# Patient Record
Sex: Female | Born: 1960
Health system: Southern US, Community
[De-identification: ages and names within clinical notes are randomized; demographics above are authoritative.]

## PROBLEM LIST (undated history)

## (undated) DIAGNOSIS — T7840XA Allergy, unspecified, initial encounter: Secondary | ICD-10-CM

## (undated) DIAGNOSIS — K219 Gastro-esophageal reflux disease without esophagitis: Secondary | ICD-10-CM

## (undated) DIAGNOSIS — I10 Essential (primary) hypertension: Secondary | ICD-10-CM

## (undated) DIAGNOSIS — E119 Type 2 diabetes mellitus without complications: Secondary | ICD-10-CM

## (undated) DIAGNOSIS — D649 Anemia, unspecified: Secondary | ICD-10-CM

## (undated) DIAGNOSIS — E785 Hyperlipidemia, unspecified: Secondary | ICD-10-CM

## (undated) HISTORY — DX: Type 2 diabetes mellitus without complications: E11.9

## (undated) HISTORY — DX: Gastro-esophageal reflux disease without esophagitis: K21.9

## (undated) HISTORY — PX: APPENDECTOMY: SHX54

## (undated) HISTORY — DX: Essential (primary) hypertension: I10

## (undated) HISTORY — PX: ABDOMINAL HYSTERECTOMY: SHX81

## (undated) HISTORY — DX: Allergy, unspecified, initial encounter: T78.40XA

## (undated) HISTORY — DX: Hyperlipidemia, unspecified: E78.5

## (undated) HISTORY — DX: Anemia, unspecified: D64.9

---

## 2001-12-24 ENCOUNTER — Emergency Department (HOSPITAL_COMMUNITY): Admission: EM | Admit: 2001-12-24 | Discharge: 2001-12-24 | Payer: Self-pay | Admitting: Emergency Medicine

## 2002-02-01 ENCOUNTER — Emergency Department (HOSPITAL_COMMUNITY): Admission: EM | Admit: 2002-02-01 | Discharge: 2002-02-01 | Payer: Self-pay | Admitting: Emergency Medicine

## 2002-02-23 ENCOUNTER — Encounter: Admission: RE | Admit: 2002-02-23 | Discharge: 2002-05-24 | Payer: Self-pay | Admitting: Occupational Medicine

## 2002-03-02 ENCOUNTER — Encounter: Payer: Self-pay | Admitting: Occupational Medicine

## 2002-03-02 ENCOUNTER — Encounter: Admission: RE | Admit: 2002-03-02 | Discharge: 2002-03-02 | Payer: Self-pay | Admitting: Occupational Medicine

## 2002-11-06 ENCOUNTER — Encounter: Payer: Self-pay | Admitting: Orthopedic Surgery

## 2002-11-06 ENCOUNTER — Encounter: Admission: RE | Admit: 2002-11-06 | Discharge: 2002-11-06 | Payer: Self-pay | Admitting: Orthopedic Surgery

## 2002-11-19 ENCOUNTER — Ambulatory Visit (HOSPITAL_BASED_OUTPATIENT_CLINIC_OR_DEPARTMENT_OTHER): Admission: RE | Admit: 2002-11-19 | Discharge: 2002-11-19 | Payer: Self-pay | Admitting: Orthopedic Surgery

## 2002-12-03 ENCOUNTER — Encounter: Admission: RE | Admit: 2002-12-03 | Discharge: 2002-12-30 | Payer: Self-pay | Admitting: Orthopedic Surgery

## 2003-03-19 ENCOUNTER — Emergency Department (HOSPITAL_COMMUNITY): Admission: AD | Admit: 2003-03-19 | Discharge: 2003-03-19 | Payer: Self-pay | Admitting: Emergency Medicine

## 2004-08-17 ENCOUNTER — Emergency Department (HOSPITAL_COMMUNITY): Admission: EM | Admit: 2004-08-17 | Discharge: 2004-08-17 | Payer: Self-pay | Admitting: Family Medicine

## 2004-11-24 ENCOUNTER — Encounter: Admission: RE | Admit: 2004-11-24 | Discharge: 2004-11-24 | Payer: Self-pay | Admitting: Internal Medicine

## 2005-01-18 ENCOUNTER — Emergency Department (HOSPITAL_COMMUNITY): Admission: EM | Admit: 2005-01-18 | Discharge: 2005-01-18 | Payer: Self-pay | Admitting: Emergency Medicine

## 2005-05-11 ENCOUNTER — Emergency Department (HOSPITAL_COMMUNITY): Admission: EM | Admit: 2005-05-11 | Discharge: 2005-05-11 | Payer: Self-pay | Admitting: Emergency Medicine

## 2005-09-03 ENCOUNTER — Encounter: Admission: RE | Admit: 2005-09-03 | Discharge: 2005-09-03 | Payer: Self-pay | Admitting: Internal Medicine

## 2005-10-04 ENCOUNTER — Encounter: Admission: RE | Admit: 2005-10-04 | Discharge: 2005-10-04 | Payer: Self-pay | Admitting: Internal Medicine

## 2005-10-09 ENCOUNTER — Emergency Department (HOSPITAL_COMMUNITY): Admission: EM | Admit: 2005-10-09 | Discharge: 2005-10-09 | Payer: Self-pay | Admitting: Emergency Medicine

## 2007-04-20 ENCOUNTER — Encounter: Admission: RE | Admit: 2007-04-20 | Discharge: 2007-04-20 | Payer: Self-pay | Admitting: Orthopedic Surgery

## 2008-05-08 ENCOUNTER — Emergency Department (HOSPITAL_BASED_OUTPATIENT_CLINIC_OR_DEPARTMENT_OTHER): Admission: EM | Admit: 2008-05-08 | Discharge: 2008-05-09 | Payer: Self-pay | Admitting: Emergency Medicine

## 2008-05-09 ENCOUNTER — Ambulatory Visit: Payer: Self-pay | Admitting: Diagnostic Radiology

## 2008-05-11 ENCOUNTER — Emergency Department (HOSPITAL_COMMUNITY): Admission: EM | Admit: 2008-05-11 | Discharge: 2008-05-11 | Payer: Self-pay | Admitting: Emergency Medicine

## 2008-05-11 ENCOUNTER — Encounter: Payer: Self-pay | Admitting: Emergency Medicine

## 2008-05-22 ENCOUNTER — Emergency Department (HOSPITAL_COMMUNITY): Admission: EM | Admit: 2008-05-22 | Discharge: 2008-05-22 | Payer: Self-pay | Admitting: Emergency Medicine

## 2008-06-27 ENCOUNTER — Inpatient Hospital Stay (HOSPITAL_COMMUNITY): Admission: EM | Admit: 2008-06-27 | Discharge: 2008-06-27 | Payer: Self-pay | Admitting: Emergency Medicine

## 2008-06-27 ENCOUNTER — Encounter (INDEPENDENT_AMBULATORY_CARE_PROVIDER_SITE_OTHER): Payer: Self-pay | Admitting: General Surgery

## 2010-01-23 IMAGING — CT CT PELVIS W/ CM
2 of 5 series · 17 of 46 positions shown, 19 images · IV contrast (agent unspecified)
Comparison: 05/09/2008

CT ABDOMEN

CLINICAL DATA: Upper abdominal pain.  Chest pain.

CT ABDOMEN AND PELVIS WITH CONTRAST
TECHNIQUE: Multidetector CT imaging of the abdomen and pelvis was
performed using the standard protocol following bolus
administration of intravenous contrast.
Contrast: 100 ml [id number]-U11

[Series 2: abd/pelvis 5.0 b31f · axial · 0.71mm/px · z∈[-426,-76]mm · 14 of 80 slices shown, 16 images]
[im 5/80  soft-tissue]
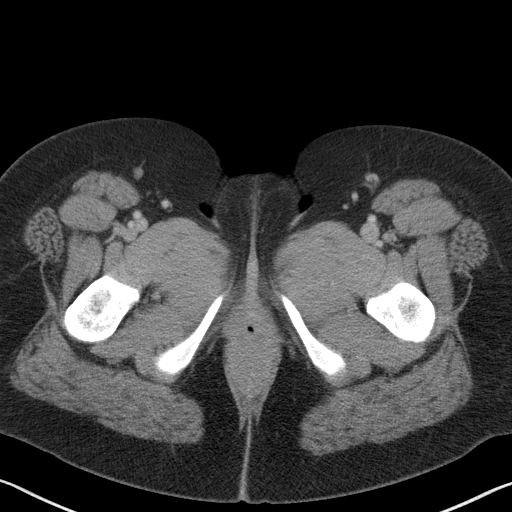
[im 5/80  bone]
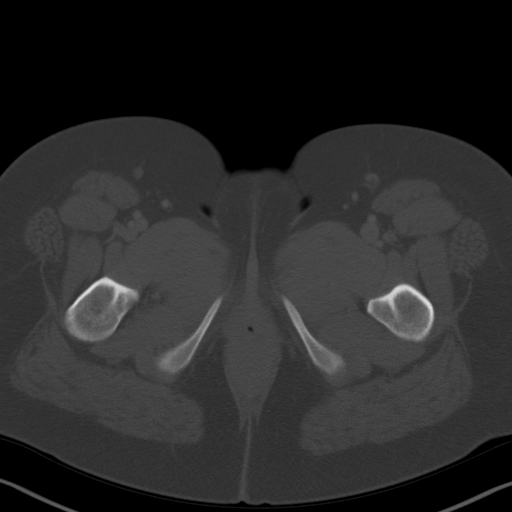
[im 9/80  soft-tissue]
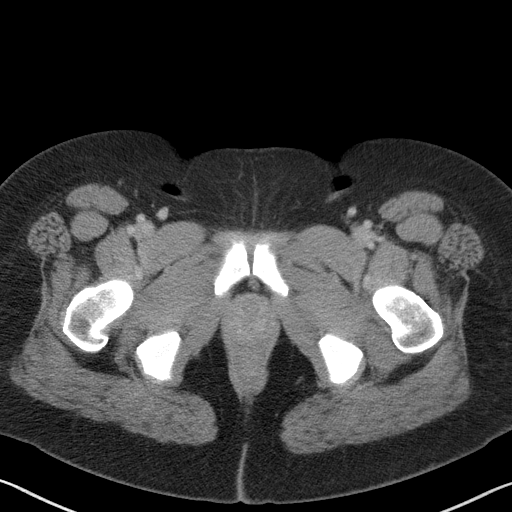
[im 18/80  soft-tissue]
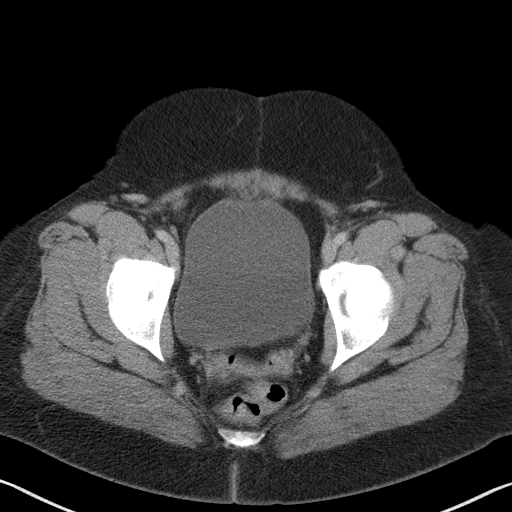
[im 22/80  soft-tissue]
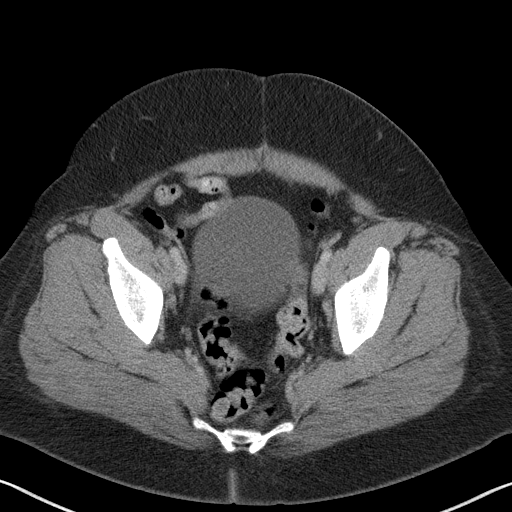
[im 27/80  soft-tissue]
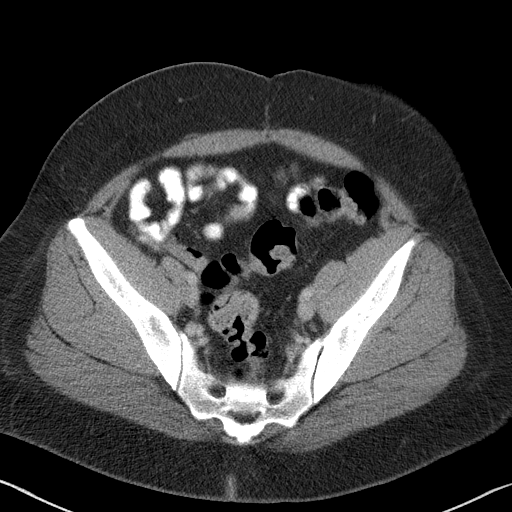
[im 31/80  soft-tissue]
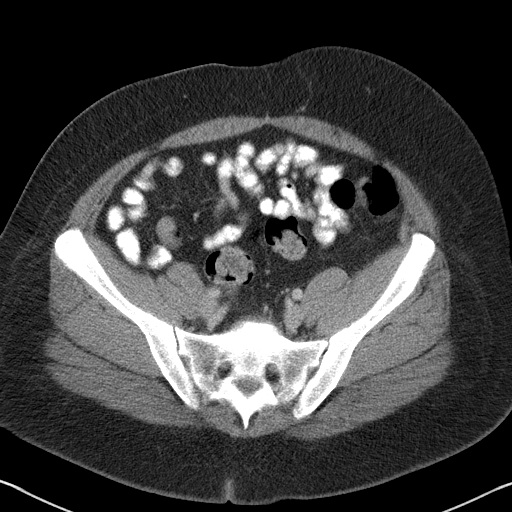
[im 36/80  soft-tissue]
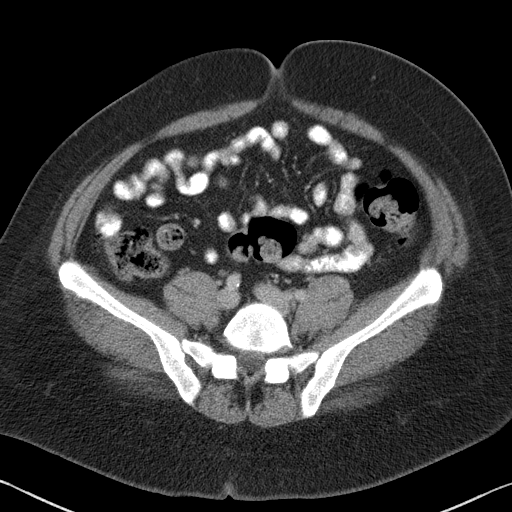
[im 44/80  soft-tissue]
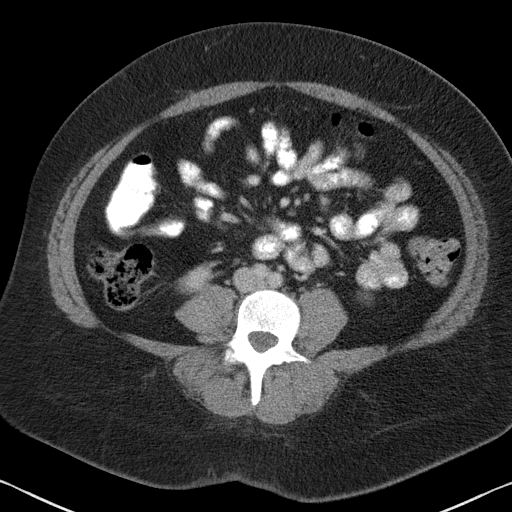
[im 49/80  soft-tissue]
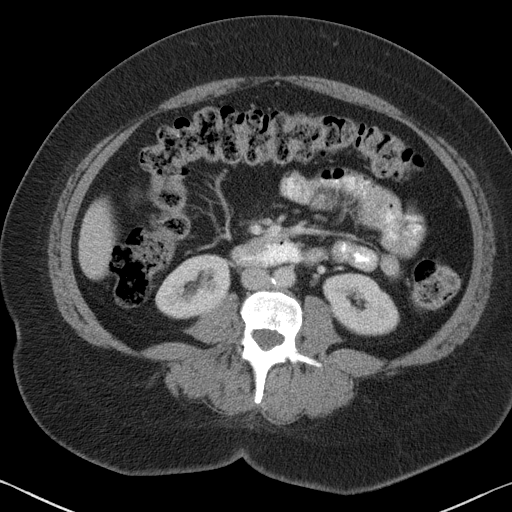
[im 49/80  bone]
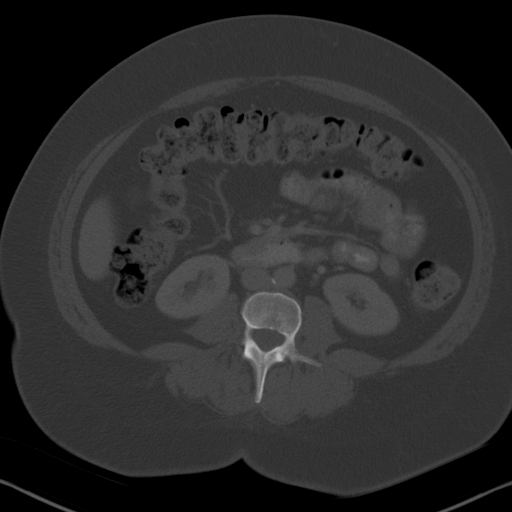
[im 53/80  soft-tissue]
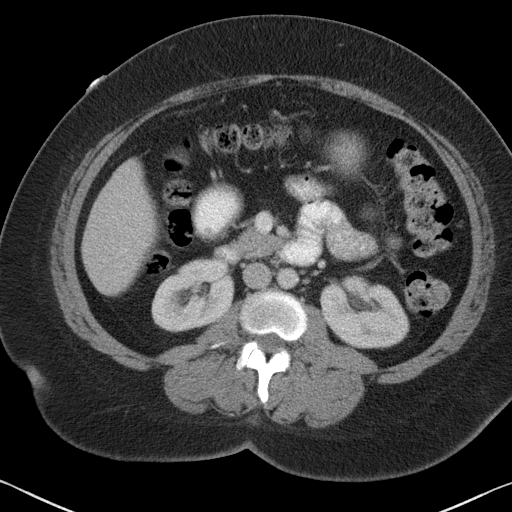
[im 58/80  soft-tissue]
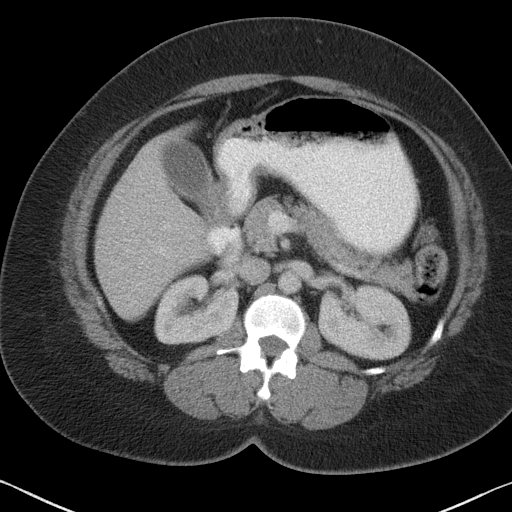
[im 62/80  soft-tissue]
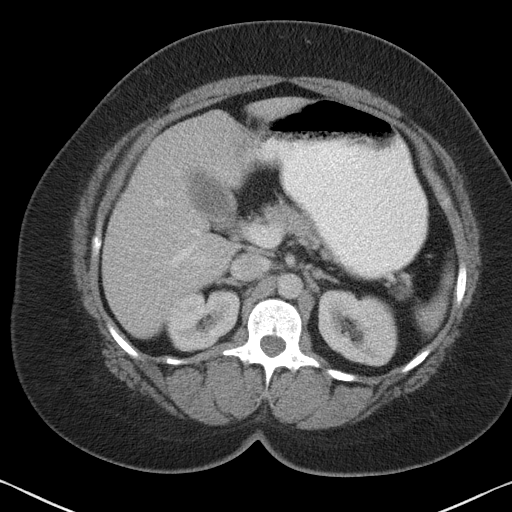
[im 71/80  soft-tissue]
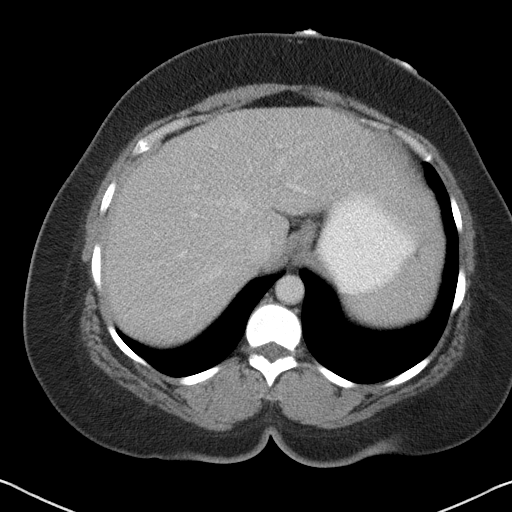
[im 75/80  soft-tissue]
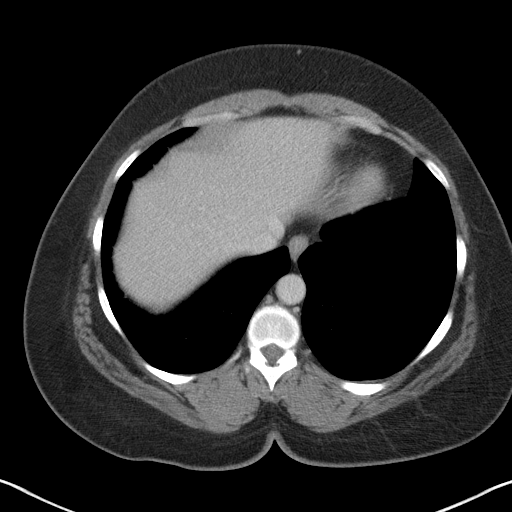

[Series 3: abd/pelvis 2.0 coronal · coronal · 0.77mm/px · 3 of 137 slices shown]
[im 46/137  soft-tissue]
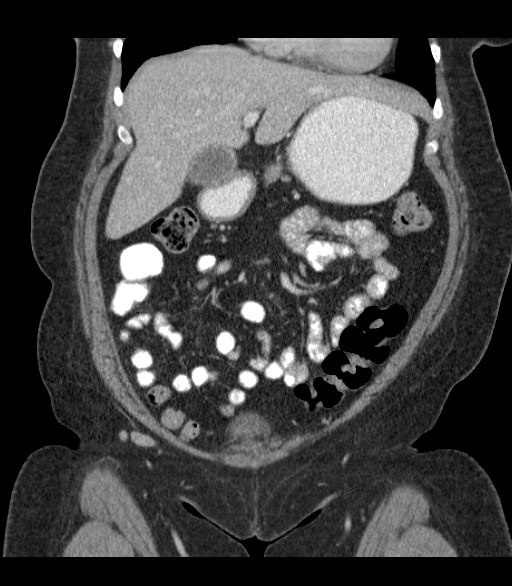
[im 61/137  soft-tissue]
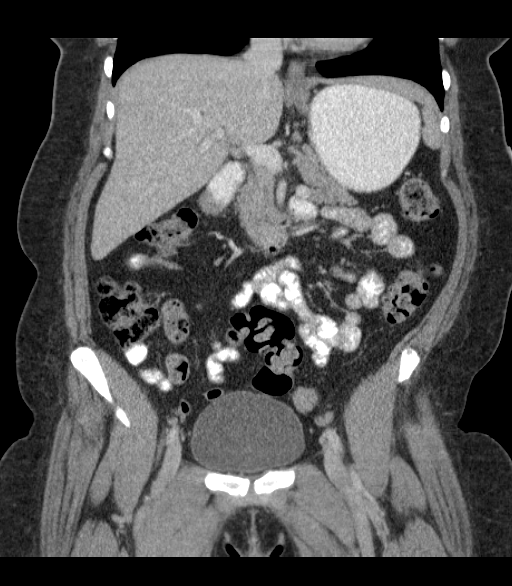
[im 76/137  soft-tissue]
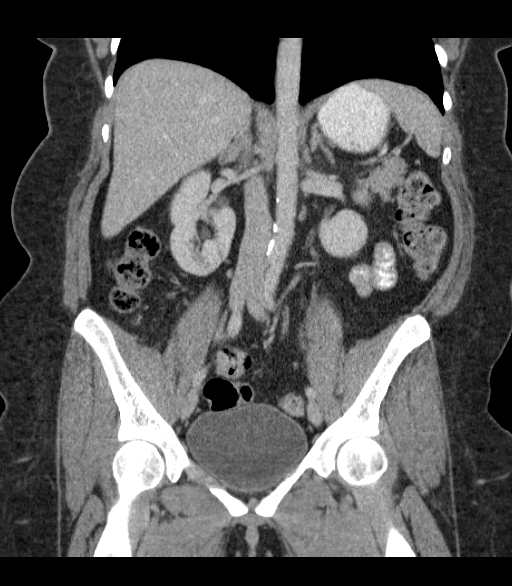

[17 of 46 positions shown; findings below may reference images not displayed]

FINDINGS: The liver, spleen, pancreas, and adrenal glands appear
unremarkable.

There is a suggestion of mild gallbladder wall thickening, along
with a 3.5 x 2.4 cm region of faintly higher density dependently in
the gallbladder which could represent tumefactive sludge,
gallstone, or a gallbladder mass.  Sonographic characterization is
recommended.

The kidneys appear unremarkable, as do the proximal ureters.

No pathologic retroperitoneal or porta hepatis adenopathy is
identified.
IMPRESSION: Dependent rounded region of density in the gallbladder could
represent tumefactive sludge, large gallstone, or gallbladder mass.
Sonographic characterization is recommended.

CT PELVIS
FINDINGS: A left pelvic sidewall lymph node measures 2.5 x 0.7 cm
on image 60 of series 2.

The appendix is not well visualized.  However, no right lower
quadrant inflammatory process is noted.

The urinary bladder appears unremarkable.  No free pelvic fluid is
noted.
IMPRESSION: 1.  No acute pelvic findings.  Please see CT the abdomen report
above.

## 2010-03-12 IMAGING — US US ABDOMEN COMPLETE
1 series · 14 of 25 positions shown · non-contrast
Comparison: 05/11/2008

CLINICAL DATA: Abdominal pain, question cholelithiasis

ABDOMEN ULTRASOUND
TECHNIQUE: Complete abdominal ultrasound examination was performed
including evaluation of the liver, gallbladder, bile ducts,
pancreas, kidneys, spleen, IVC, and abdominal aorta.

[Series 1: us abdomen complete · 0.30mm/px · 14 of 69 slices shown]
[im 1/69]
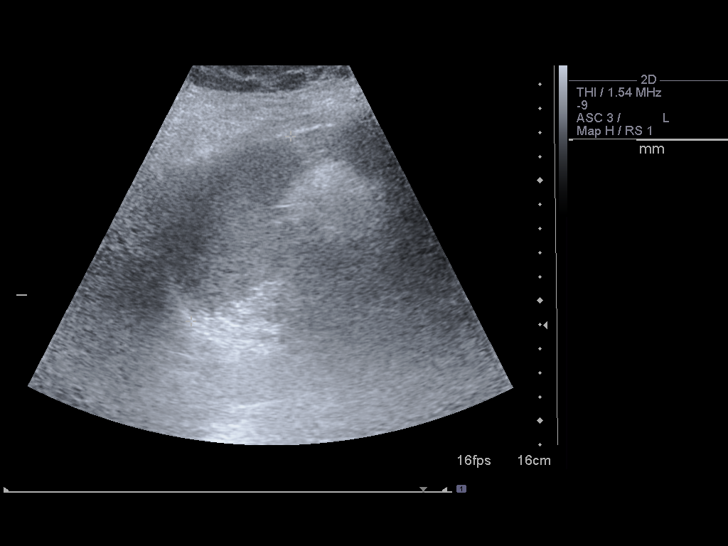
[im 6/69]
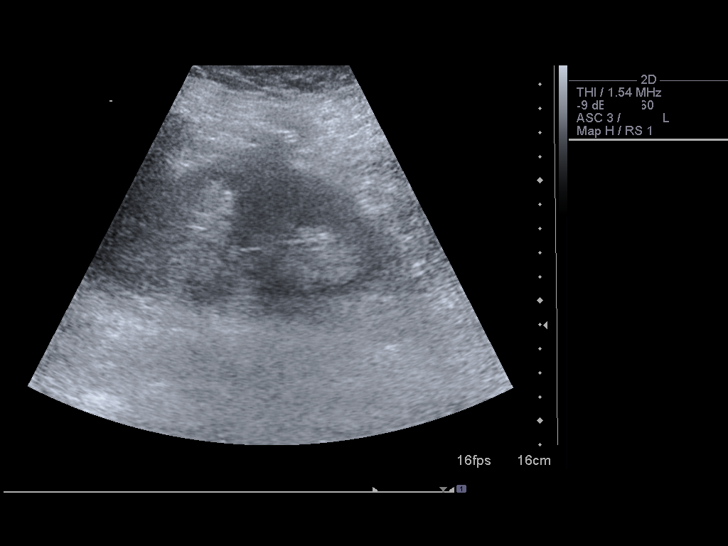
[im 12/69]
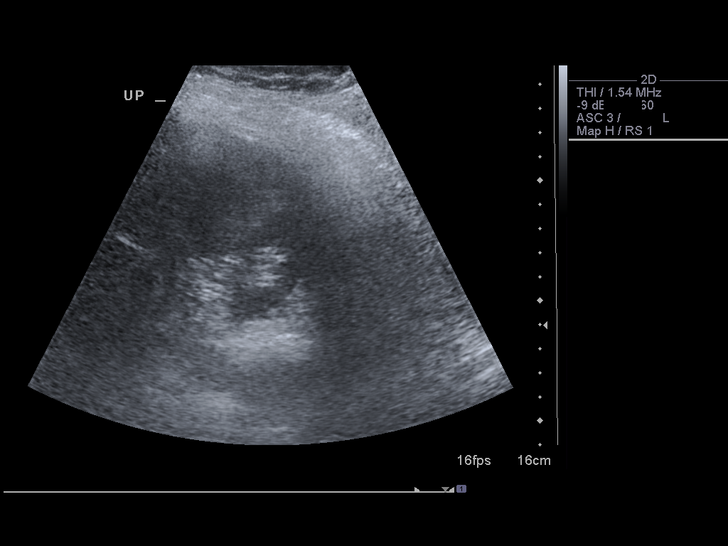
[im 18/69]
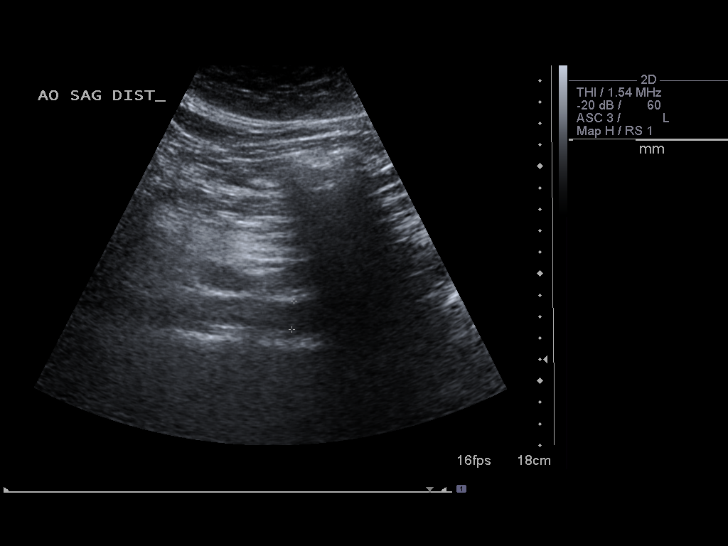
[im 23/69]
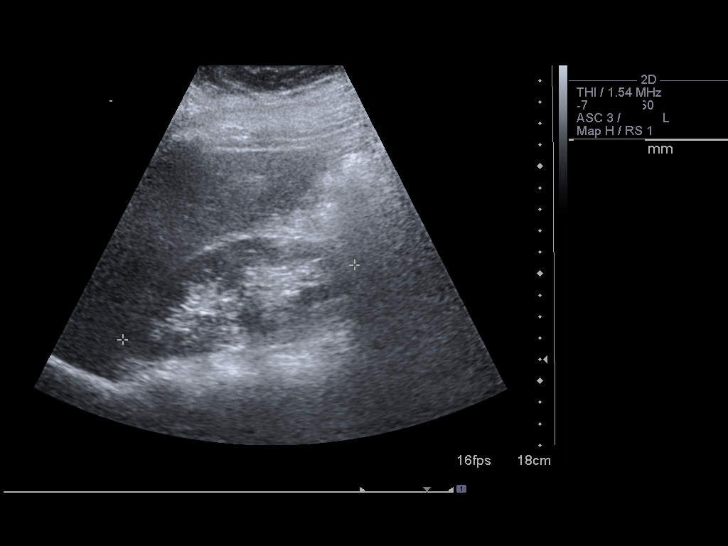
[im 26/69]
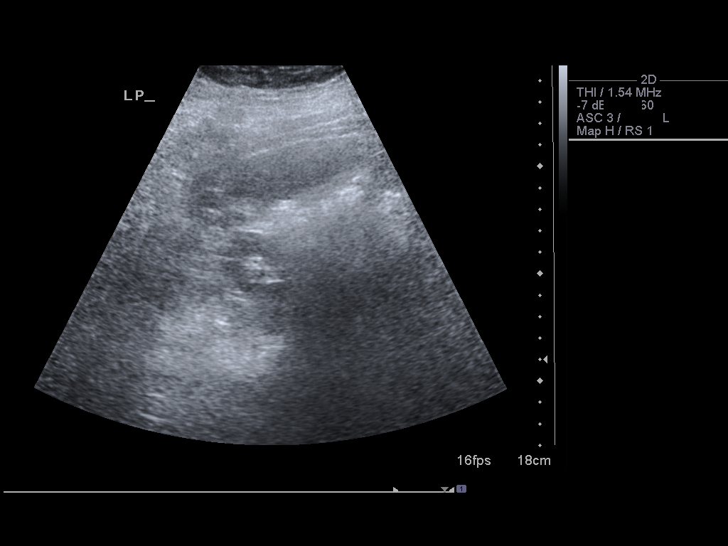
[im 32/69]
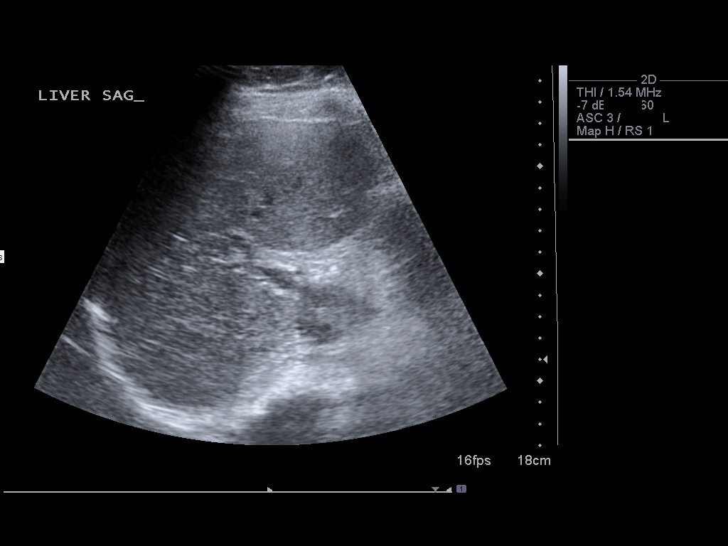
[im 37/69]
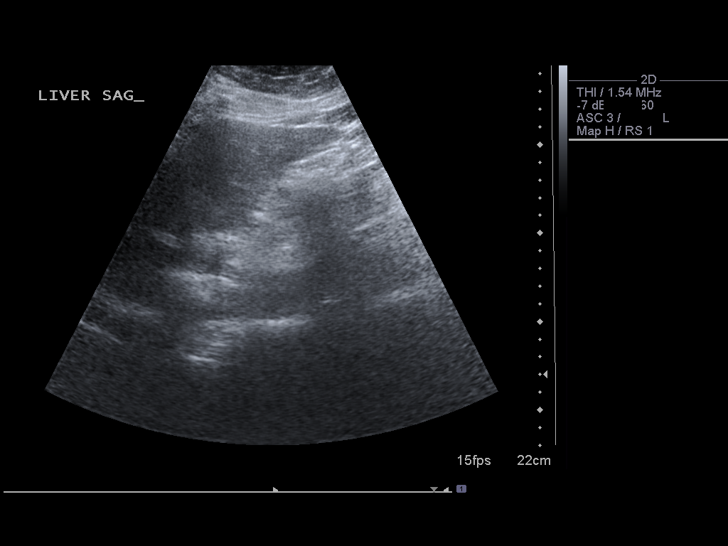
[im 43/69]
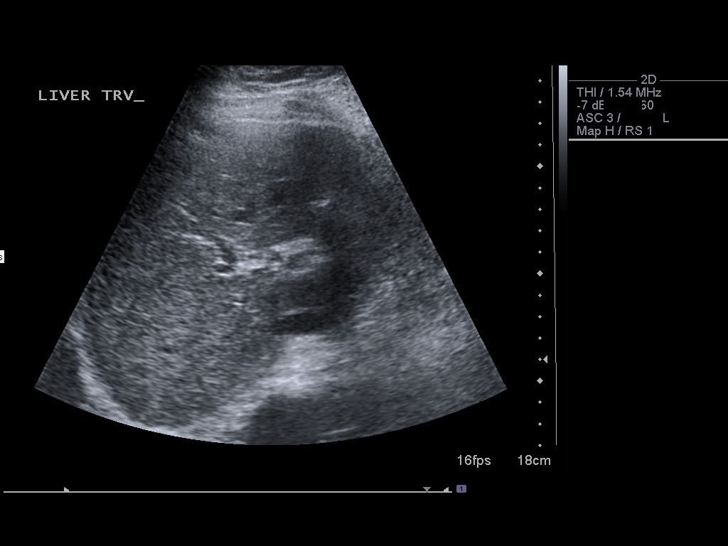
[im 46/69]
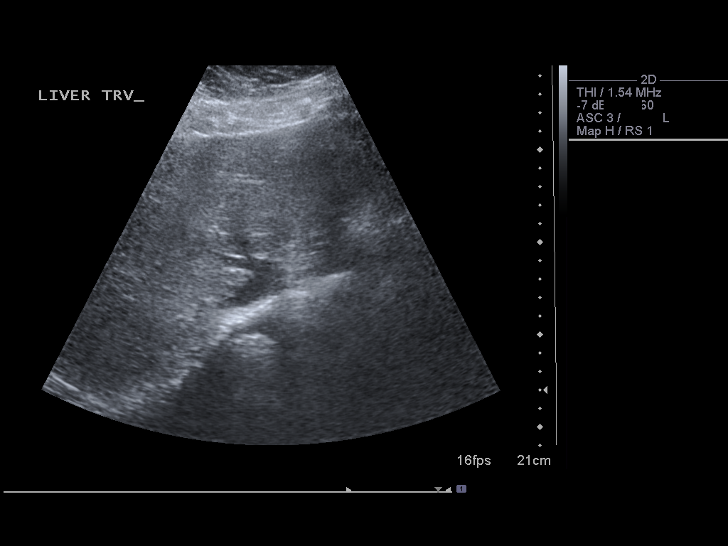
[im 52/69]
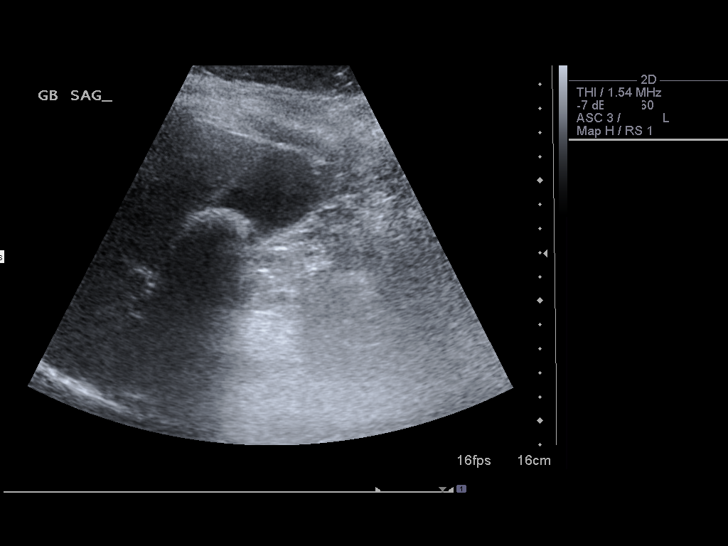
[im 57/69]
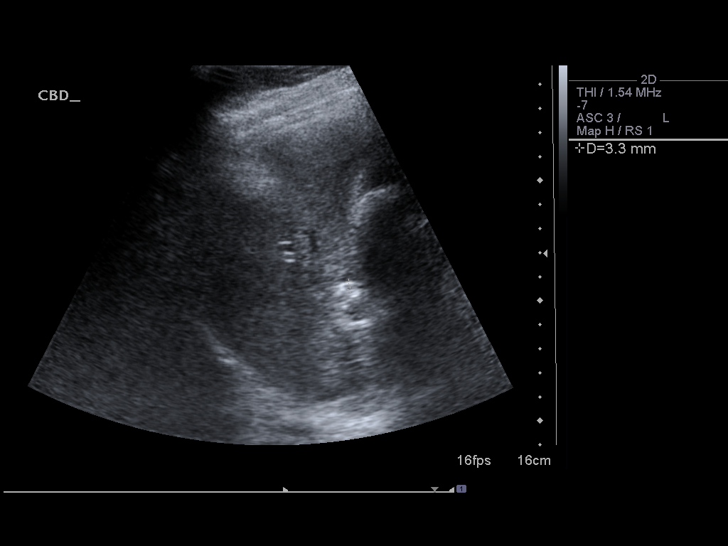
[im 63/69]
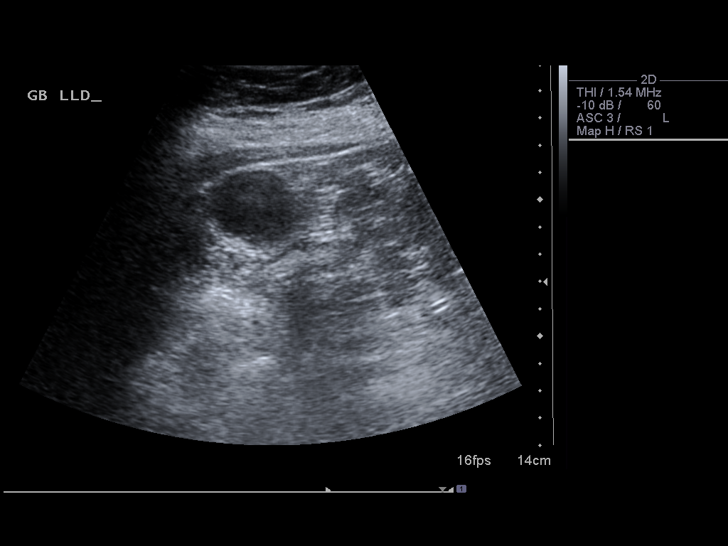
[im 69/69]
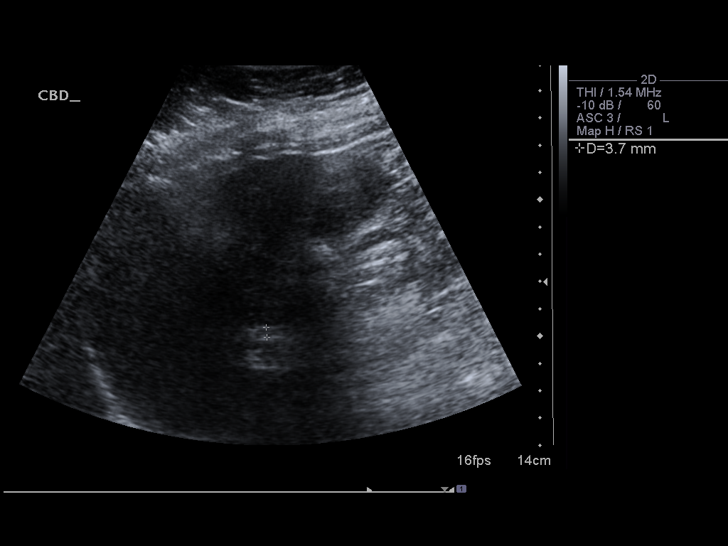

[14 of 25 positions shown; findings below may reference images not displayed]

FINDINGS: Image quality degraded by bowel gas.
Large calcified shadowing gallstone within gallbladder, 3.3 cm
diameter.
Sonographic Murphy's sign is present.
Gallbladder wall appears minimally thickened and striated.
Findings suspicious for early acute cholecystitis.
No biliary dilatation, CBD 4 mm diameter.
Liver incompletely visualized due to bowel gas.
Pancreas not visualized.
No focal abnormalities of liver or spleen identified, spleen 8.7 cm
length.
Kidneys normal size and morphology, 11.4 cm length right and
cm left.
Aorta and IVC normal.
No definite ascites.
IMPRESSION: Large 3.3 cm diameter calculus in gallbladder with mild gallbladder
wall thickening and presence of a sonographic Murphy's sign,
suspicious for early acute cholecystitis.
No evidence of biliary dilatation.
Nonvisualization of pancreas.

## 2010-06-11 ENCOUNTER — Encounter: Payer: Self-pay | Admitting: Internal Medicine

## 2010-09-04 LAB — CBC
HCT: 38.2 % (ref 36.0–46.0)
Hemoglobin: 12.7 g/dL (ref 12.0–15.0)
MCHC: 33.2 g/dL (ref 30.0–36.0)
MCV: 74.1 fL — ABNORMAL LOW (ref 78.0–100.0)
Platelets: 336 10*3/uL (ref 150–400)
RBC: 5.15 MIL/uL — ABNORMAL HIGH (ref 3.87–5.11)
RDW: 15.5 % (ref 11.5–15.5)
WBC: 10.8 10*3/uL — ABNORMAL HIGH (ref 4.0–10.5)

## 2010-09-04 LAB — COMPREHENSIVE METABOLIC PANEL
ALT: 14 U/L (ref 0–35)
AST: 15 U/L (ref 0–37)
Albumin: 3.5 g/dL (ref 3.5–5.2)
Alkaline Phosphatase: 121 U/L — ABNORMAL HIGH (ref 39–117)
BUN: 6 mg/dL (ref 6–23)
CO2: 30 mEq/L (ref 19–32)
Calcium: 9.8 mg/dL (ref 8.4–10.5)
Chloride: 102 mEq/L (ref 96–112)
Creatinine, Ser: 0.95 mg/dL (ref 0.4–1.2)
GFR calc Af Amer: >60 mL/min (ref >60)
GFR calc non Af Amer: >60 mL/min (ref >60)
Glucose, Bld: 130 mg/dL — ABNORMAL HIGH (ref 70–99)
Potassium: 4.4 mEq/L (ref 3.5–5.1)
Sodium: 141 mEq/L (ref 135–145)
Total Bilirubin: 0.7 mg/dL (ref 0.3–1.2)
Total Protein: 7.7 g/dL (ref 6.0–8.3)

## 2010-09-04 LAB — DIFFERENTIAL
Basophils Absolute: 0.2 10*3/uL — ABNORMAL HIGH (ref 0.0–0.1)
Basophils Relative: 2 % — ABNORMAL HIGH (ref 0–1)
Eosinophils Absolute: 0.2 10*3/uL (ref 0.0–0.7)
Eosinophils Relative: 2 % (ref 0–5)
Lymphocytes Relative: 28 % (ref 12–46)
Lymphs Abs: 3 10*3/uL (ref 0.7–4.0)
Monocytes Absolute: 0.8 10*3/uL (ref 0.1–1.0)
Monocytes Relative: 7 % (ref 3–12)
Neutro Abs: 6.5 10*3/uL (ref 1.7–7.7)
Neutrophils Relative %: 61 % (ref 43–77)

## 2010-09-04 LAB — LIPASE, BLOOD: Lipase: 35 U/L (ref 11–59)

## 2010-09-05 LAB — DIFFERENTIAL
Basophils Absolute: 0 10*3/uL (ref 0.0–0.1)
Basophils Absolute: 0.1 10*3/uL (ref 0.0–0.1)
Basophils Relative: 0 % (ref 0–1)
Basophils Relative: 1 % (ref 0–1)
Eosinophils Absolute: 0.1 10*3/uL (ref 0.0–0.7)
Eosinophils Absolute: 0.2 10*3/uL (ref 0.0–0.7)
Eosinophils Relative: 1 % (ref 0–5)
Eosinophils Relative: 1 % (ref 0–5)
Lymphocytes Relative: 21 % (ref 12–46)
Lymphocytes Relative: 29 % (ref 12–46)
Lymphs Abs: 2.4 10*3/uL (ref 0.7–4.0)
Lymphs Abs: 2.6 10*3/uL (ref 0.7–4.0)
Monocytes Absolute: 0.7 10*3/uL (ref 0.1–1.0)
Monocytes Absolute: 0.7 10*3/uL (ref 0.1–1.0)
Monocytes Relative: 6 % (ref 3–12)
Monocytes Relative: 7 % (ref 3–12)
Neutro Abs: 5.5 10*3/uL (ref 1.7–7.7)
Neutro Abs: 7.8 10*3/uL — ABNORMAL HIGH (ref 1.7–7.7)
Neutrophils Relative %: 62 % (ref 43–77)
Neutrophils Relative %: 71 % (ref 43–77)

## 2010-09-05 LAB — COMPREHENSIVE METABOLIC PANEL
ALT: 15 U/L (ref 0–35)
ALT: 16 U/L (ref 0–35)
AST: 15 U/L (ref 0–37)
AST: 16 U/L (ref 0–37)
Albumin: 3.2 g/dL — ABNORMAL LOW (ref 3.5–5.2)
Albumin: 3.7 g/dL (ref 3.5–5.2)
Alkaline Phosphatase: 105 U/L (ref 39–117)
Alkaline Phosphatase: 114 U/L (ref 39–117)
BUN: 7 mg/dL (ref 6–23)
BUN: 8 mg/dL (ref 6–23)
CO2: 23 mEq/L (ref 19–32)
CO2: 28 mEq/L (ref 19–32)
Calcium: 9.4 mg/dL (ref 8.4–10.5)
Calcium: 9.7 mg/dL (ref 8.4–10.5)
Chloride: 105 mEq/L (ref 96–112)
Chloride: 108 mEq/L (ref 96–112)
Creatinine, Ser: 0.72 mg/dL (ref 0.4–1.2)
Creatinine, Ser: 0.77 mg/dL (ref 0.4–1.2)
GFR calc Af Amer: >60 mL/min (ref >60)
GFR calc Af Amer: >60 mL/min (ref >60)
GFR calc non Af Amer: >60 mL/min (ref >60)
GFR calc non Af Amer: >60 mL/min (ref >60)
Glucose, Bld: 105 mg/dL — ABNORMAL HIGH (ref 70–99)
Glucose, Bld: 124 mg/dL — ABNORMAL HIGH (ref 70–99)
Potassium: 3.9 mEq/L (ref 3.5–5.1)
Potassium: 4.4 mEq/L (ref 3.5–5.1)
Sodium: 137 mEq/L (ref 135–145)
Sodium: 140 mEq/L (ref 135–145)
Total Bilirubin: 0.4 mg/dL (ref 0.3–1.2)
Total Bilirubin: 0.5 mg/dL (ref 0.3–1.2)
Total Protein: 7.1 g/dL (ref 6.0–8.3)
Total Protein: 7.7 g/dL (ref 6.0–8.3)

## 2010-09-05 LAB — CBC
HCT: 35.1 % — ABNORMAL LOW (ref 36.0–46.0)
HCT: 38.6 % (ref 36.0–46.0)
Hemoglobin: 11.9 g/dL — ABNORMAL LOW (ref 12.0–15.0)
Hemoglobin: 12.7 g/dL (ref 12.0–15.0)
MCHC: 32.8 g/dL (ref 30.0–36.0)
MCHC: 34 g/dL (ref 30.0–36.0)
MCV: 74.3 fL — ABNORMAL LOW (ref 78.0–100.0)
MCV: 75.7 fL — ABNORMAL LOW (ref 78.0–100.0)
Platelets: 252 10*3/uL (ref 150–400)
Platelets: 293 10*3/uL (ref 150–400)
RBC: 4.72 MIL/uL (ref 3.87–5.11)
RBC: 5.09 MIL/uL (ref 3.87–5.11)
RDW: 15.2 % (ref 11.5–15.5)
RDW: 15.3 % (ref 11.5–15.5)
WBC: 11.1 10*3/uL — ABNORMAL HIGH (ref 4.0–10.5)
WBC: 9 10*3/uL (ref 4.0–10.5)

## 2010-09-05 LAB — LIPASE, BLOOD: Lipase: 28 U/L (ref 11–59)

## 2010-10-03 NOTE — Op Note (Signed)
NAME:  Karen [name], Karen [name]              ACCOUNT NO.:  [id number]   MEDICAL RECORD NO.:  [id number]          PATIENT TYPE:  INP   LOCATION:  1529                         FACILITY:  [facility]   PHYSICIAN:  Lennie Muckle, MD      DATE OF BIRTH:  08/12/1960   DATE OF PROCEDURE:  06/27/2008  DATE OF DISCHARGE:                               OPERATIVE REPORT   PREOPERATIVE DIAGNOSES:  1. Biliary colic.  2. Cholecystitis.   POSTOPERATIVE DIAGNOSES:  1. Biliary colic.  2. Cholecystitis.   PROCEDURE:  Laparoscopic cholecystectomy.   SURGEON:  Amber L. Freida Busman, M.D.   ASSISTANT:  Alfonse Ras, M.D.   General endotracheal anesthesia.   FINDINGS:  Thickened gallbladder wall, large stone in gallbladder.   SPECIMEN:  Gallbladder.   No immediate complications.  No drains were placed.   INDICATIONS FOR PROCEDURE:  Karen [name] is a 50 year old female who was  admitted early morning, June 27, 2008, for acute onset abdominal  pain.  I had seen her previously in the office and was scheduled to have  a laparoscopic cholecystectomy on February 22nd.  Due to her multiple  admissions with abdominal pain and findings which were consistent with  acute cholecystitis, I discussed with the patient performing a  cholecystectomy this admission.  Informed consent was obtained prior to  the procedure.   DETAILS OF PROCEDURE:  Karen [name] was identified in the preoperative  holding area.  She received 400 mg of Cipro and was taken to the  operating suite.  Once in the operating room, placed in the supine  position.  After administration of general endotracheal anesthesia, a  time-out procedure indicating the patient and procedure performed.  Her  abdomen was prepped and draped in the usual sterile fashion.  An  incision was placed above the umbilicus.  Fascia was grasped with a  Kocher and I placed a Veress needle into the abdominal cavity.  After  adequate pneumo insufflation, I used a 10-mm trocar,  using an Optiview,  to gain entry into the abdominal cavity.  The abdomen was inspected,  there was no evidence of injury on placement of the Veress needle or the  trocar.  I placed three additional trocars in the upper abdomen.  These  were placed under visualization with the camera.  Gallbladder was  identified in normal anatomic position.  It was somewhat distended.  Using a grasper, I grasped the fundus and retracted it to the head of  the patient.  The gallbladder wall was somewhat thickened.  I was able  to retract the infundibulum away from the liver bed using [place]  forceps and the hook electrocautery.  I was able to find a cystic artery  which was coursing somewhat medial anterior to the cystic duct.  I  clipped and divided this.  I then continued dissecting and was able to  gain a critical view of the cystic duct and the liver bed.  I placed 3  clips proximally on the cystic duct, 1 distally and transected with  laparoscopic scissors.  I then continued dissecting the gallbladder wall  with the hook electrocautery.  A small amount of bile spillage did occur  with dissection.  The gallbladder was placed in an EndoCatch bag and  removed from the umbilical incision.  A large stone was palpable.  I  inspected the liver bed.  There was no oozing or bleeding.  I irrigated  the abdomen with a liter of saline.  There seemed to be no evidence of  biliary leakage or bleeding.  I then closed the fascial defect with a 0  Vicryl suture.  I did place 1 additional stitch with a laparoscopic  suture passer.  The fascia was palpated, there were no defects which  were palpated.  Skin was closed with 4-0 Monocryl.  Dermabond was placed  as final dressing.  The patient was extubated, transferred to the post  anesthesia care unit in stable condition.  She will be advanced from  clears to regular diet.  If she does have someone at home, she may be  discharged home later; otherwise, will be  discharged home in the  morning.      Lennie Muckle, MD  Electronically Signed     ALA/MEDQ  D:  06/27/2008  T:  06/27/2008  Job:  [id number]

## 2010-10-03 NOTE — H&P (Signed)
NAME:  Karen [name], Karen [name]              ACCOUNT NO.:  [id number]   MEDICAL RECORD NO.:  [id number]          PATIENT TYPE:  INP   LOCATION:  1529                         FACILITY:  [facility]   PHYSICIAN:  Lennie Muckle, MD      DATE OF BIRTH:  [birth year]   DATE OF ADMISSION:  06/26/2008  DATE OF DISCHARGE:                              HISTORY & PHYSICAL   Abdominal pain.  Karen [name] is a 50 year old female I had seen in the  office for biliary colic.  Apparently she had an acute onset of  abdominal pain on June 26, 2008, that was located in the right upper  abdomen.  It was sharp and she did not have relief at home, and she came  into the emergency department.  She had a repeat of laboratory data.  The white count was mildly elevated at 11.  Serum chemistries were  otherwise normal.  She did have reimaging of the gallbladder with a  positive Murphy sign.  There was some mild thickening noted on the  ultrasound.  She was admitted with IV narcotics and was somewhat better.   PAST MEDICAL HISTORY:  Reviewed:  1. History of reflux disease.  2. Hyperlipidemia.   PAST SURGICAL HISTORY:  Hysterectomy.   SOCIAL HISTORY:  No alcohol use, she does smoke, she lives at home  alone.   ALLERGIES:  PENICILLIN.   MEDICATIONS:  Include Aciphex and Zofran.   REVIEW OF SYSTEMS:  Negative.   PHYSICAL EXAMINATION:  VITAL SIGNS:  In the morning, temperature was  98.4, blood pressure 140/86, pulse 82, and 98% on room air.  GENERAL:  She appears in no acute distress, does have a significant  amount of pain with palpation of her abdomen.  The examination is  otherwise unremarkable.   ASSESSMENT AND PLAN:  Likely cholecystitis with biliary colic.  I have  discussed with Karen [name] I felt that she had had multiple evaluations  in the emergency department and was scheduled later for a  cholecystectomy, that she would benefit from a cholecystectomy today.  I  talked about the procedure with her, and we  decided to go ahead and  perform the cholecystectomy.  She will be taken to the operating room  and pending the results later being discharged home or kept overnight.  If she does stay the night, then more than likely she will be stable  discharge in the morning.     Lennie Muckle, MD  Electronically Signed    ALA/MEDQ  D:  06/27/2008  T:  06/27/2008  Job:  [id number]

## 2010-10-06 NOTE — Op Note (Signed)
NAME:  Karen [name], Karen [name]                          ACCOUNT NO.:  [id number]   MEDICAL RECORD NO.:  [id number]                   PATIENT TYPE:  AMB   LOCATION:  DSC                                  FACILITY:  [facility]   PHYSICIAN:  Rodney A. Chaney Malling, M.D.           DATE OF BIRTH:  1960-08-24   DATE OF PROCEDURE:  11/19/2002  DATE OF DISCHARGE:                                 OPERATIVE REPORT   PREOPERATIVE DIAGNOSES:  1. Radial tunnel, right elbow.  2. Chronic lateral epicondylitis, right elbow.   POSTOPERATIVE DIAGNOSES:  1. Radial tunnel, right elbow.  2. Chronic lateral epicondylitis, right elbow.   OPERATION:  Release extensor origin and lateral epicondyle, right elbow,  with some acromion explore and release, radial nerve at  arcade of Frohse, right elbow.   SURGEON:  Lenard Galloway. Chaney Malling, M.D.   ANESTHESIA:  General.   PROCEDURE:  The patient was placed on the operating table in the supine  position with a pneumatic tourniquet about the right upper arm.  The right  upper extremity was prepped with Duraprep and draped out in the usual  manner.  A zigzag-type of Brunner incision made over the anterior aspect of  the right elbow after the tourniquet had been elevated.  An incision started  in the interval between the brachioradialis and extended slightly distal to  the antecubital fossa.  A self-retaining retractor was put in place.  Blunt  dissection was used.  Bleeders were coagulated.  The interval between the  brachialis and brachioradialis was identified.  Then using the nerve  stimulator, the radial nerve was identified nicely.  Blunt dissection was  carried distally and the superficial branch and the deep branch of the  radial nerve were clearly seen.  There was a great deal of adherence of  muscle in this area.  Tight bands were seen about the radial nerve, and this  was quite compromised.  These bands were released.  It was rather impressive  the way the radial nerve  was being bound up.  Blunt dissection was carried  distally and the nerve was freed up very nicely.  Large lashes of vessels  were individually released and coagulated with the bipolar.  The arcade of  Frohse was incised.  Excellent decompression of the radial nerve was  achieved.   Attention turned to the lateral epicondyle.  An incision made about the  lateral epicondyle.  The skin edges were retracted.  The fascia was split  and was separated.  The extensor origin was clearly seen off the lateral  epicondyle.  The extensor origin was partially excised.  Mucinous  degeneration was noted in this area, and this was excised.  An osteotome was  then used to remove a flake of bone off the lateral epicondyle.  Excellent  decompression of the damaged portion of the extensor origin was released and  excised.  Great care was taken to avoid  any entry into the elbow joint  itself.  Bleeders were coagulated.  Bone wax was placed on the bleeding bone  and bleeding was controlled very nicely.  The fascia was then closed with  running absorbable sutures.  Vicryl 2-0 was also used for the subcutaneous  tissue.  Both wounds were closed with 2-0 Vicryl in the subcutaneous tissue  and stainless steel staples used to close the skin.  Sterile dressings were  applied and the patient returned to the recovery room in excellent  condition.  Technically this procedure went extremely well.    DRAINS:  None.   COMPLICATIONS:  None.                                               Rodney A. Chaney Malling, M.D.    RAM/MEDQ  D:  11/19/2002  T:  11/19/2002  Job:  [id number]

## 2010-11-07 ENCOUNTER — Other Ambulatory Visit: Payer: Self-pay | Admitting: Internal Medicine

## 2010-11-07 DIAGNOSIS — N644 Mastodynia: Secondary | ICD-10-CM

## 2010-11-09 ENCOUNTER — Ambulatory Visit
Admission: RE | Admit: 2010-11-09 | Discharge: 2010-11-09 | Disposition: A | Discharge status: Home / Self Care | Payer: BC Managed Care – PPO | Source: Ambulatory Visit | Attending: Internal Medicine | Admitting: Internal Medicine

## 2010-11-09 DIAGNOSIS — N644 Mastodynia: Secondary | ICD-10-CM

## 2011-02-23 LAB — COMPREHENSIVE METABOLIC PANEL
ALT: 13 U/L (ref 0–35)
AST: 19 U/L (ref 0–37)
Albumin: 4.3 g/dL (ref 3.5–5.2)
Alkaline Phosphatase: 131 U/L — ABNORMAL HIGH (ref 39–117)
BUN: 15 mg/dL (ref 6–23)
CO2: 30 mEq/L (ref 19–32)
Calcium: 10.2 mg/dL (ref 8.4–10.5)
Chloride: 103 mEq/L (ref 96–112)
Creatinine, Ser: 0.9 mg/dL (ref 0.4–1.2)
GFR calc Af Amer: >60 mL/min (ref >60)
GFR calc non Af Amer: >60 mL/min (ref >60)
Glucose, Bld: 146 mg/dL — ABNORMAL HIGH (ref 70–99)
Potassium: 4.1 mEq/L (ref 3.5–5.1)
Sodium: 144 mEq/L (ref 135–145)
Total Bilirubin: 0.4 mg/dL (ref 0.3–1.2)
Total Protein: 8.1 g/dL (ref 6.0–8.3)

## 2011-02-23 LAB — URINALYSIS, ROUTINE W REFLEX MICROSCOPIC
Bilirubin Urine: NEGATIVE
Glucose, UA: NEGATIVE mg/dL
Hgb urine dipstick: NEGATIVE
Ketones, ur: NEGATIVE mg/dL
Nitrite: NEGATIVE
Protein, ur: NEGATIVE mg/dL
Specific Gravity, Urine: 1.022 (ref 1.005–1.030)
Urobilinogen, UA: 1 mg/dL (ref 0.0–1.0)
pH: 6 (ref 5.0–8.0)

## 2011-02-23 LAB — DIFFERENTIAL
Basophils Absolute: 0.2 10*3/uL — ABNORMAL HIGH (ref 0.0–0.1)
Basophils Relative: 1 % (ref 0–1)
Eosinophils Absolute: 0.1 10*3/uL (ref 0.0–0.7)
Eosinophils Relative: 1 % (ref 0–5)
Lymphocytes Relative: 14 % (ref 12–46)
Lymphs Abs: 1.9 10*3/uL (ref 0.7–4.0)
Monocytes Absolute: 0.7 10*3/uL (ref 0.1–1.0)
Monocytes Relative: 5 % (ref 3–12)
Neutro Abs: 10.6 10*3/uL — ABNORMAL HIGH (ref 1.7–7.7)
Neutrophils Relative %: 79 % — ABNORMAL HIGH (ref 43–77)

## 2011-02-23 LAB — LIPASE, BLOOD: Lipase: 64 U/L (ref 23–300)

## 2011-02-23 LAB — CBC
HCT: 36.6 % (ref 36.0–46.0)
Hemoglobin: 12.1 g/dL (ref 12.0–15.0)
MCHC: 32.9 g/dL (ref 30.0–36.0)
MCV: 74.4 fL — ABNORMAL LOW (ref 78.0–100.0)
Platelets: 285 10*3/uL (ref 150–400)
RBC: 4.92 MIL/uL (ref 3.87–5.11)
RDW: 14.6 % (ref 11.5–15.5)
WBC: 13.5 10*3/uL — ABNORMAL HIGH (ref 4.0–10.5)

## 2011-07-04 ENCOUNTER — Ambulatory Visit: Payer: BC Managed Care – PPO | Admitting: Sports Medicine

## 2011-07-25 ENCOUNTER — Ambulatory Visit: Payer: BC Managed Care – PPO | Admitting: Sports Medicine

## 2011-10-03 ENCOUNTER — Emergency Department (HOSPITAL_COMMUNITY)
Admission: EM | Admit: 2011-10-03 | Discharge: 2011-10-04 | Disposition: A | Discharge status: Home / Self Care | Payer: BC Managed Care – PPO | Attending: Emergency Medicine | Admitting: Emergency Medicine

## 2011-10-03 DIAGNOSIS — R1110 Vomiting, unspecified: Secondary | ICD-10-CM | POA: Insufficient documentation

## 2011-10-03 DIAGNOSIS — R109 Unspecified abdominal pain: Secondary | ICD-10-CM | POA: Insufficient documentation

## 2011-10-04 ENCOUNTER — Encounter (HOSPITAL_COMMUNITY): Payer: Self-pay

## 2011-10-04 ENCOUNTER — Emergency Department (HOSPITAL_COMMUNITY): Payer: BC Managed Care – PPO

## 2011-10-04 LAB — COMPREHENSIVE METABOLIC PANEL
ALT: 16 U/L (ref 0–35)
AST: 16 U/L (ref 0–37)
Albumin: 3.6 g/dL (ref 3.5–5.2)
Alkaline Phosphatase: 119 U/L — ABNORMAL HIGH (ref 39–117)
BUN: 11 mg/dL (ref 6–23)
CO2: 26 mEq/L (ref 19–32)
Calcium: 9.5 mg/dL (ref 8.4–10.5)
Chloride: 102 mEq/L (ref 96–112)
Creatinine, Ser: 0.79 mg/dL (ref 0.50–1.10)
GFR calc Af Amer: >90 mL/min (ref >90)
GFR calc non Af Amer: >90 mL/min (ref >90)
Glucose, Bld: 108 mg/dL — ABNORMAL HIGH (ref 70–99)
Potassium: 3.7 mEq/L (ref 3.5–5.1)
Sodium: 138 mEq/L (ref 135–145)
Total Bilirubin: 0.2 mg/dL — ABNORMAL LOW (ref 0.3–1.2)
Total Protein: 8.2 g/dL (ref 6.0–8.3)

## 2011-10-04 LAB — DIFFERENTIAL
Basophils Absolute: 0 10*3/uL (ref 0.0–0.1)
Basophils Relative: 0 % (ref 0–1)
Eosinophils Absolute: 0.2 10*3/uL (ref 0.0–0.7)
Eosinophils Relative: 2 % (ref 0–5)
Lymphocytes Relative: 34 % (ref 12–46)
Lymphs Abs: 3.3 10*3/uL (ref 0.7–4.0)
Monocytes Absolute: 0.6 10*3/uL (ref 0.1–1.0)
Monocytes Relative: 6 % (ref 3–12)
Neutro Abs: 5.6 10*3/uL (ref 1.7–7.7)
Neutrophils Relative %: 58 % (ref 43–77)

## 2011-10-04 LAB — URINALYSIS, ROUTINE W REFLEX MICROSCOPIC
Bilirubin Urine: NEGATIVE
Glucose, UA: NEGATIVE mg/dL
Hgb urine dipstick: NEGATIVE
Ketones, ur: NEGATIVE mg/dL
Leukocytes, UA: NEGATIVE
Nitrite: NEGATIVE
Protein, ur: NEGATIVE mg/dL
Specific Gravity, Urine: 1.029 (ref 1.005–1.030)
Urobilinogen, UA: 0.2 mg/dL (ref 0.0–1.0)
pH: 6 (ref 5.0–8.0)

## 2011-10-04 LAB — CBC
HCT: 36.8 % (ref 36.0–46.0)
Hemoglobin: 12.7 g/dL (ref 12.0–15.0)
MCH: 24.1 pg — ABNORMAL LOW (ref 26.0–34.0)
MCHC: 34.5 g/dL (ref 30.0–36.0)
MCV: 70 fL — ABNORMAL LOW (ref 78.0–100.0)
Platelets: 295 10*3/uL (ref 150–400)
RBC: 5.26 MIL/uL — ABNORMAL HIGH (ref 3.87–5.11)
RDW: 15.3 % (ref 11.5–15.5)
WBC: 9.7 10*3/uL (ref 4.0–10.5)

## 2011-10-04 LAB — LIPASE, BLOOD: Lipase: 41 U/L (ref 11–59)

## 2011-10-04 MED ORDER — ONDANSETRON 4 MG PO TBDP: 4.0000 mg | ORAL_TABLET | Freq: Three times a day (TID) | ORAL | Status: AC | PRN

## 2011-10-04 MED ORDER — HYDROCODONE-ACETAMINOPHEN 5-325 MG PO TABS: 1.0000 | ORAL_TABLET | ORAL | Status: AC | PRN

## 2011-10-04 MED ORDER — SODIUM CHLORIDE 0.9 % IV BOLUS (SEPSIS)
1000.0000 mL | Freq: Once | INTRAVENOUS | Status: AC
  Administered 2011-10-04: 1000 mL via INTRAVENOUS

## 2011-10-04 MED ORDER — HYDROMORPHONE HCL PF 1 MG/ML IJ SOLN
1.0000 mg | Freq: Once | INTRAMUSCULAR | Status: AC
  Administered 2011-10-04: 1 mg via INTRAVENOUS
  Filled 2011-10-04: qty 1

## 2011-10-04 MED ORDER — ONDANSETRON HCL 4 MG/2ML IJ SOLN
4.0000 mg | Freq: Once | INTRAMUSCULAR | Status: AC
Start: 2011-10-04 — End: 2011-10-04
  Administered 2011-10-04: 4 mg via INTRAVENOUS
  Filled 2011-10-04: qty 2

## 2011-10-04 NOTE — ED Notes (Signed)
Patient transported to Ultrasound 

## 2011-10-04 NOTE — ED Provider Notes (Signed)
Medical screening examination/treatment/procedure(s) were performed by non-physician practitioner and as supervising physician I was immediately available for consultation/collaboration.   Lyanne Co, MD 10/04/11 [phone]

## 2011-10-04 NOTE — ED Notes (Signed)
Pt complains of severe abd pain with vomiting since 2030, she also complains of seeing spots

## 2011-10-04 NOTE — ED Notes (Signed)
Patient back from US.

## 2011-10-04 NOTE — ED Provider Notes (Signed)
History     CSN: [id number]  Arrival date & time 10/03/11  2243   First MD Initiated Contact with Patient 10/04/11 0157      Chief Complaint  Patient presents with  . 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    Patient is a 51 y.o. female presenting with abdominal pain. The history is provided by the patient. No language interpreter was used.  Abdominal Pain The primary symptoms of the illness include abdominal pain, nausea and vomiting. The primary symptoms of the illness do not include fever, fatigue, shortness of breath, diarrhea, hematemesis, hematochezia, dysuria, vaginal discharge or vaginal bleeding. The current episode started 3 to 5 hours ago. The onset of the illness was gradual. The problem has been gradually improving.  The illness is associated with eating. The patient states that she believes she is currently not pregnant. The patient has not had a change in bowel habit. Symptoms associated with the illness do not include chills, anorexia, diaphoresis, heartburn, constipation, urgency, hematuria, frequency or back pain.    History reviewed. No pertinent past medical history.  History reviewed. No pertinent past surgical history.  History reviewed. No pertinent family history.  History  Substance Use Topics  . Smoking status: Not on file  . Smokeless tobacco: Not on file  . Alcohol Use: No    OB History    Grav Para Term Preterm Abortions TAB SAB Ect Mult Living                    Review of Systems  Constitutional: Negative for fever, chills, diaphoresis and fatigue.  Respiratory: Negative for shortness of breath.   Gastrointestinal: Positive for nausea, vomiting and abdominal pain. Negative for heartburn, diarrhea, constipation, hematochezia, anorexia and hematemesis.  Genitourinary: Negative for dysuria, urgency, frequency, hematuria, vaginal bleeding and vaginal discharge.  Musculoskeletal: Negative for back pain.  All other systems reviewed and are negative.    Allergies  Penicillins  Home Medications   Current Outpatient Rx  Name Route Sig Dispense Refill  . DIPHENHYDRAMINE-APAP (SLEEP) 25-500 MG PO TABS Oral Take 1 tablet by mouth at bedtime as needed. Sleep    . ZOLPIDEM TARTRATE 5 MG PO TABS Oral Take 5 mg by mouth at bedtime as needed. Insomnia      BP 133/69  Pulse 70  Temp(Src) 98.7 F (37.1 C) (Oral)  Resp 20  SpO2 94%  Physical Exam  Nursing note and vitals reviewed. Constitutional: She is oriented to person, place, and time. She appears well-developed and well-nourished. No distress.  HENT:  Head: Normocephalic and atraumatic.  Right Ear: External ear normal.  Left Ear: External ear normal.  Nose: Nose normal.  Mouth/Throat: Oropharynx is clear and moist. No oropharyngeal exudate.  Eyes: Conjunctivae are normal. Pupils are equal, round, and reactive to light. No scleral icterus.  Neck: Normal range of motion. Neck supple.  Cardiovascular: Normal rate and regular rhythm.  Exam reveals no gallop and no friction rub.   No murmur heard. Pulmonary/Chest: Breath sounds normal. No respiratory distress. She has  no wheezes. She has no rales. She exhibits no tenderness.  Abdominal: Soft. Bowel sounds are normal. She exhibits no distension and no mass. There is tenderness in the right upper quadrant and epigastric area. There is no rebound and no guarding.    Musculoskeletal: Normal range of motion. She exhibits no edema and no tenderness.   Lymphadenopathy:    She has no cervical adenopathy.  Neurological: She is alert and oriented to person, place, and time. No cranial nerve deficit.  Skin: Skin is warm and dry. No rash noted. No erythema. No pallor.  Psychiatric: She has a normal mood and affect. Her behavior is normal. Judgment and thought content normal.    ED Course  Procedures (including critical care time)  Labs Reviewed  CBC - Abnormal; Notable for the following:    RBC 5.26 (*)    MCV 70.0 (*)    MCH 24.1 (*)    All other components within normal limits  COMPREHENSIVE METABOLIC PANEL - Abnormal; Notable for the following:    Glucose, Bld 108 (*)    Alkaline Phosphatase 119 (*)    Total Bilirubin 0.2 (*)    All other components within normal limits  URINALYSIS, ROUTINE W REFLEX MICROSCOPIC - Abnormal; Notable for the following:    APPearance CLOUDY (*)    All other components within normal limits  DIFFERENTIAL  LIPASE, BLOOD   US Abdomen Complete  10/04/2011  *RADIOLOGY REPORT*  Clinical Data:  Abdominal pain  COMPLETE ABDOMINAL ULTRASOUND  Comparison:  06/26/2008  Findings:  Gallbladder:  Surgically absent.  No fluid in the gallbladder fossa.  Common bile duct:  Normal caliber, measuring 4.2 mm diameter.  Liver:  No focal lesion identified.  Within normal limits in parenchymal echogenicity.  IVC:  Appears normal.  Pancreas:  Not visualized due to overlying bowel gas.  Spleen:  Spleen length measures 8.7 cm.  Normal parenchymal echotexture.  Right Kidney:  Right kidney measures 12.7 cm in length.  No hydronephrosis.  Left Kidney:  Left kidney measures 13.1 cm length.  No hydronephrosis.  Abdominal aorta:  No aneurysm identified.  IMPRESSION: Surgical absence of the gallbladder.  Examination is otherwise unremarkable.  Original Report Authenticated By: Marlon Pel, M.D.   Results for orders placed during the hospital encounter of 10/03/11  CBC      Component Value Range   WBC 9.7  4.0 - 10.5 (K/uL)    RBC 5.26 (*) 3.87 - 5.11 (MIL/uL)   Hemoglobin 12.7  12.0 - 15.0 (g/dL)   HCT 16.1  09.6 - 04.5 (%)   MCV 70.0 (*) 78.0 - 100.0 (fL)   MCH 24.1 (*) 26.0 - 34.0 (pg)   MCHC 34.5  30.0 - 36.0 (g/dL)   RDW 40.9  81.1 - 91.4 (%)   Platelets 295  150 - 400 (K/uL)  DIFFERENTIAL      Component Value Range   Neutrophils Relative 58  43 - 77 (%)   Neutro Abs 5.6  1.7 - 7.7 (K/uL)   Lymphocytes Relative 34  12 - 46 (%)   Lymphs Abs 3.3  0.7 - 4.0 (K/uL)   Monocytes Relative 6  3 - 12 (%)   Monocytes Absolute 0.6  0.1 - 1.0 (K/uL)   Eosinophils Relative 2  0 - 5 (%)   Eosinophils Absolute 0.2  0.0 - 0.7 (K/uL)   Basophils Relative 0  0 - 1 (%)   Basophils Absolute 0.0  0.0 - 0.1 (K/uL)  COMPREHENSIVE METABOLIC PANEL  Component Value Range   Sodium 138  135 - 145 (mEq/L)   Potassium 3.7  3.5 - 5.1 (mEq/L)   Chloride 102  96 - 112 (mEq/L)   CO2 26  19 - 32 (mEq/L)   Glucose, Bld 108 (*) 70 - 99 (mg/dL)   BUN 11  6 - 23 (mg/dL)   Creatinine, Ser 5.78  0.50 - 1.10 (mg/dL)   Calcium 9.5  8.4 - 46.9 (mg/dL)   Total Protein 8.2  6.0 - 8.3 (g/dL)   Albumin 3.6  3.5 - 5.2 (g/dL)   AST 16  0 - 37 (U/L)   ALT 16  0 - 35 (U/L)   Alkaline Phosphatase 119 (*) 39 - 117 (U/L)   Total Bilirubin 0.2 (*) 0.3 - 1.2 (mg/dL)   GFR calc non Af Amer >90  >90 (mL/min)   GFR calc Af Amer >90  >90 (mL/min)  LIPASE, BLOOD      Component Value Range   Lipase 41  11 - 59 (U/L)  URINALYSIS, ROUTINE W REFLEX MICROSCOPIC      Component Value Range   Color, Urine YELLOW  YELLOW    APPearance CLOUDY (*) CLEAR    Specific Gravity, Urine 1.029  1.005 - 1.030    pH 6.0  5.0 - 8.0    Glucose, UA NEGATIVE  NEGATIVE (mg/dL)   Hgb urine dipstick NEGATIVE  NEGATIVE    Bilirubin Urine NEGATIVE  NEGATIVE    Ketones, ur NEGATIVE  NEGATIVE (mg/dL)   Protein, ur NEGATIVE  NEGATIVE (mg/dL)   Urobilinogen, UA 0.2  0.0 - 1.0 (mg/dL)   Nitrite NEGATIVE  NEGATIVE    Leukocytes, UA NEGATIVE  NEGATIVE    US Abdomen  Complete  10/04/2011  *RADIOLOGY REPORT*  Clinical Data:  Abdominal pain  COMPLETE ABDOMINAL ULTRASOUND  Comparison:  06/26/2008  Findings:  Gallbladder:  Surgically absent.  No fluid in the gallbladder fossa.  Common bile duct:  Normal caliber, measuring 4.2 mm diameter.  Liver:  No focal lesion identified.  Within normal limits in parenchymal echogenicity.  IVC:  Appears normal.  Pancreas:  Not visualized due to overlying bowel gas.  Spleen:  Spleen length measures 8.7 cm.  Normal parenchymal echotexture.  Right Kidney:  Right kidney measures 12.7 cm in length.  No hydronephrosis.  Left Kidney:  Left kidney measures 13.1 cm length.  No hydronephrosis.  Abdominal aorta:  No aneurysm identified.  IMPRESSION: Surgical absence of the gallbladder.  Examination is otherwise unremarkable.  Original Report Authenticated By: Marlon Pel, M.D.      Abdominal pain Vomiting   MDM  Patient here with day history of intermittent abdominal pain, no evidence of retained stone on ultrasound and patient reports improvement in pain after medication, is able to tolerate po fluids.  There is no evidence for surgical emergency, labs are re-assuring.        Izola Price [place], [place] 10/04/11 [phone]

## 2012-02-14 ENCOUNTER — Ambulatory Visit (INDEPENDENT_AMBULATORY_CARE_PROVIDER_SITE_OTHER): Payer: BC Managed Care – PPO | Admitting: Emergency Medicine

## 2012-02-14 VITALS — BP 156/92 | HR 95 | Temp 97.9°F | Resp 17 | Ht 64.0 in | Wt 225.0 lb

## 2012-02-14 DIAGNOSIS — E119 Type 2 diabetes mellitus without complications: Secondary | ICD-10-CM | POA: Insufficient documentation

## 2012-02-14 DIAGNOSIS — E1159 Type 2 diabetes mellitus with other circulatory complications: Secondary | ICD-10-CM | POA: Insufficient documentation

## 2012-02-14 DIAGNOSIS — R200 Anesthesia of skin: Secondary | ICD-10-CM

## 2012-02-14 DIAGNOSIS — R209 Unspecified disturbances of skin sensation: Secondary | ICD-10-CM

## 2012-02-14 DIAGNOSIS — I10 Essential (primary) hypertension: Secondary | ICD-10-CM

## 2012-02-14 DIAGNOSIS — R51 Headache: Secondary | ICD-10-CM

## 2012-02-14 DIAGNOSIS — Z131 Encounter for screening for diabetes mellitus: Secondary | ICD-10-CM

## 2012-02-14 DIAGNOSIS — I152 Hypertension secondary to endocrine disorders: Secondary | ICD-10-CM | POA: Insufficient documentation

## 2012-02-14 DIAGNOSIS — R42 Dizziness and giddiness: Secondary | ICD-10-CM

## 2012-02-14 LAB — POCT CBC
Granulocyte percent: 64.6 %G (ref 37–80)
HCT, POC: 41.9 % (ref 37.7–47.9)
Hemoglobin: 13.3 g/dL (ref 12.2–16.2)
Lymph, poc: 2.6 (ref 0.6–3.4)
MCH, POC: 23.9 pg — AB (ref 27–31.2)
MCHC: 31.7 g/dL — AB (ref 31.8–35.4)
MCV: 75.3 fL — AB (ref 80–97)
MID (cbc): 0.7 (ref 0–0.9)
MPV: 9.5 fL (ref 0–99.8)
POC Granulocyte: 6.1 (ref 2–6.9)
POC LYMPH PERCENT: 28 %L (ref 10–50)
POC MID %: 7.4 %M (ref 0–12)
Platelet Count, POC: 352 10*3/uL (ref 142–424)
RBC: 5.57 M/uL — AB (ref 4.04–5.48)
RDW, POC: 16.7 %
WBC: 9.4 10*3/uL (ref 4.6–10.2)

## 2012-02-14 LAB — LIPID PANEL
Cholesterol: 201 mg/dL — ABNORMAL HIGH (ref 0–200)
HDL: 42 mg/dL (ref >39)
LDL Cholesterol: 136 mg/dL — ABNORMAL HIGH (ref 0–99)
Total CHOL/HDL Ratio: 4.8 Ratio
Triglycerides: 114 mg/dL (ref <150)
VLDL: 23 mg/dL (ref 0–40)

## 2012-02-14 LAB — COMPREHENSIVE METABOLIC PANEL
ALT: 23 U/L (ref 0–35)
AST: 19 U/L (ref 0–37)
Albumin: 4.2 g/dL (ref 3.5–5.2)
Alkaline Phosphatase: 116 U/L (ref 39–117)
BUN: 7 mg/dL (ref 6–23)
CO2: 28 mEq/L (ref 19–32)
Calcium: 9.6 mg/dL (ref 8.4–10.5)
Chloride: 101 mEq/L (ref 96–112)
Creat: 0.77 mg/dL (ref 0.50–1.10)
Glucose, Bld: 105 mg/dL — ABNORMAL HIGH (ref 70–99)
Potassium: 3.8 mEq/L (ref 3.5–5.3)
Sodium: 139 mEq/L (ref 135–145)
Total Bilirubin: 0.4 mg/dL (ref 0.3–1.2)
Total Protein: 8 g/dL (ref 6.0–8.3)

## 2012-02-14 LAB — TSH: TSH: 1.825 u[IU]/mL (ref 0.350–4.500)

## 2012-02-14 LAB — GLUCOSE, POCT (MANUAL RESULT ENTRY): POC Glucose: 101 mg/dl — AB (ref 70–99)

## 2012-02-14 LAB — POCT GLYCOSYLATED HEMOGLOBIN (HGB A1C): Hemoglobin A1C: 7.1

## 2012-02-14 MED ORDER — LOSARTAN POTASSIUM-HCTZ 50-12.5 MG PO TABS: ORAL_TABLET | ORAL | Status: DC

## 2012-02-14 MED ORDER — METFORMIN HCL 500 MG PO TABS: ORAL_TABLET | ORAL | Status: DC

## 2012-02-14 NOTE — Progress Notes (Signed)
Subjective:    Patient ID: Karen [name], female    DOB: 05/03/1961, 51 y.o.   MRN: [id number]  HPI 51 year old female presents with hypertension and elevated A1c. She had a health screening at work today that revealed her blood pressure being 160/90, an A1c of 9.3. Her screening last year in 2012 revealed a BP of 124/70 and an A1c of 7.8.  Patient states that she gets pain that radiates from her right arm down her right leg and she feels numbness in her fingers at times. She is also having episodes of dizziness. She has also gained 8 pounds in the last year. Patient has had a full hysterectomy and goes every year for her mammogram at the [facility] on [address].   Review of Systems patient states about a month ago she had a headache that is associated with a numb sensation in her right arm and right leg. She was told that this was probably secondary to inner ear. She's had headaches off and on since that time     Objective:   Physical Exam HEENT exam is unremarkable. Her neck is supple. Her chest is clear cardiac exam reveals a regular rate and rhythm without murmurs rubs or gallops. Abdominal exam it is a lower abdominal scar. There are no palpable masses. Extremities are without edema.  EKG shows no acute change  Results for orders placed during the hospital encounter of 10/03/11  CBC      Component Value Range   WBC 9.7  4.0 - 10.5 K/uL   RBC 5.26 (*) 3.87 - 5.11 MIL/uL   Hemoglobin 12.7  12.0 - 15.0 g/dL   HCT 08.6  57.8 - 46.9 %   MCV 70.0 (*) 78.0 - 100.0 fL   MCH 24.1 (*) 26.0 - 34.0 pg   MCHC 34.5  30.0 - 36.0 g/dL   RDW 62.9  52.8 - 41.3 %   Platelets 295  150 - 400 K/uL  DIFFERENTIAL      Component Value Range   Neutrophils Relative 58  43 - 77 %   Neutro Abs 5.6  1.7 - 7.7 K/uL   Lymphocytes Relative 34  12 - 46 %   Lymphs Abs 3.3  0.7 - 4.0 K/uL   Monocytes Relative 6  3 - 12 %   Monocytes Absolute 0.6  0.1 - 1.0 K/uL   Eosinophils Relative 2  0 - 5 %   Eosinophils Absolute 0.2  0.0 - 0.7 K/uL   Basophils Relative 0  0 - 1 %   Basophils Absolute 0.0  0.0 - 0.1 K/uL  COMPREHENSIVE METABOLIC PANEL      Component Value Range   Sodium 138  135 - 145 mEq/L   Potassium 3.7  3.5 - 5.1 mEq/L   Chloride 102  96 - 112 mEq/L   CO2 26  19 - 32 mEq/L   Glucose, Bld 108 (*) 70 - 99 mg/dL   BUN 11  6 - 23 mg/dL   Creatinine, Ser 2.44  0.50 - 1.10 mg/dL   Calcium 9.5  8.4 - 01.0 mg/dL   Total Protein 8.2  6.0 - 8.3 g/dL   Albumin 3.6  3.5 - 5.2 g/dL   AST 16  0 - 37 U/L   ALT 16  0 - 35 U/L   Alkaline Phosphatase 119 (*) 39 - 117 U/L   Total Bilirubin 0.2 (*) 0.3 - 1.2 mg/dL   GFR calc non Af Amer >90  >90  mL/min   GFR calc Af Amer >90  >90 mL/min  LIPASE, BLOOD      Component Value Range   Lipase 41  11 - 59 U/L  URINALYSIS, ROUTINE W REFLEX MICROSCOPIC      Component Value Range   Color, Urine YELLOW  YELLOW   APPearance CLOUDY (*) CLEAR   Specific Gravity, Urine 1.029  1.005 - 1.030   pH 6.0  5.0 - 8.0   Glucose, UA NEGATIVE  NEGATIVE mg/dL   Hgb urine dipstick NEGATIVE  NEGATIVE   Bilirubin Urine NEGATIVE  NEGATIVE   Ketones, ur NEGATIVE  NEGATIVE mg/dL   Protein, ur NEGATIVE  NEGATIVE mg/dL   Urobilinogen, UA 0.2  0.0 - 1.0 mg/dL   Nitrite NEGATIVE  NEGATIVE   Leukocytes, UA NEGATIVE  NEGATIVE   Results for orders placed in visit on 02/14/12  POCT CBC      Component Value Range   WBC 9.4  4.6 - 10.2 K/uL   Lymph, poc 2.6  0.6 - 3.4   POC LYMPH PERCENT 28.0  10 - 50 %L   MID (cbc) 0.7  0 - 0.9   POC MID % 7.4  0 - 12 %M   POC Granulocyte 6.1  2 - 6.9   Granulocyte percent 64.6  37 - 80 %G   RBC 5.57 (*) 4.04 - 5.48 M/uL   Hemoglobin 13.3  12.2 - 16.2 g/dL   HCT, POC 14.7  82.9 - 47.9 %   MCV 75.3 (*) 80 - 97 fL   MCH, POC 23.9 (*) 27 - 31.2 pg   MCHC 31.7 (*) 31.8 - 35.4 g/dL   RDW, POC 56.2     Platelet Count, POC 352  142 - 424 K/uL   MPV 9.5  0 - 99.8 fL  GLUCOSE, POCT (MANUAL RESULT ENTRY)      Component Value  Range   POC Glucose 101 (*) 70 - 99 mg/dl  POCT GLYCOSYLATED HEMOGLOBIN (HGB A1C)      Component Value Range   Hemoglobin A1C 7.1        Assessment & Plan:  It appears upon this both hypertensive and diabetic. I am concerned about this episode she had earlier with headache associated with them funny sensation in her right arm and right leg and this needs to be pursued. I plan to start on a baby aspirin a day along with Glucophage to taper to one twice a day. Have also initiated Hyzaar 50/12.5 one a day for blood pressure control

## 2012-02-14 NOTE — Patient Instructions (Signed)
Call [phone] to make an appointment with Dr. Clelia Croft or Dr. Audria Nine for next month.  Hypertension As your heart beats, it forces blood through your arteries. This force is your blood pressure. If the pressure is too high, it is called hypertension (HTN) or high blood pressure. HTN is dangerous because you may have it and not know it. High blood pressure may mean that your heart has to work harder to pump blood. Your arteries may be narrow or stiff. The extra work puts you at risk for heart disease, stroke, and other problems.  Blood pressure consists of two numbers, a higher number over a lower, 110/72, for example. It is stated as "110 over 72." The ideal is below 120 for the top number (systolic) and under 80 for the bottom (diastolic). Write down your blood pressure today. You should pay close attention to your blood pressure if you have certain conditions such as:  Heart failure.   Prior heart attack.   Diabetes   Chronic kidney disease.   Prior stroke.   Multiple risk factors for heart disease.  To see if you have HTN, your blood pressure should be measured while you are seated with your arm held at the level of the heart. It should be measured at least twice. A one-time elevated blood pressure reading (especially in the Emergency Department) does not mean that you need treatment. There may be conditions in which the blood pressure is different between your right and left arms. It is important to see your caregiver soon for a recheck. Most people have essential hypertension which means that there is not a specific cause. This type of high blood pressure may be lowered by changing lifestyle factors such as:  Stress.   Smoking.   Lack of exercise.   Excessive weight.   Drug/tobacco/alcohol use.   Eating less salt.  Most people do not have symptoms from high blood pressure until it has caused damage to the body. Effective treatment can often prevent, delay or reduce that  damage. TREATMENT  When a cause has been identified, treatment for high blood pressure is directed at the cause. There are a large number of medications to treat HTN. These fall into several categories, and your caregiver will help you select the medicines that are best for you. Medications may have side effects. You should review side effects with your caregiver. If your blood pressure stays high after you have made lifestyle changes or started on medicines,   Your medication(s) may need to be changed.   Other problems may need to be addressed.   Be certain you understand your prescriptions, and know how and when to take your medicine.   Be sure to follow up with your caregiver within the time frame advised (usually within two weeks) to have your blood pressure rechecked and to review your medications.   If you are taking more than one medicine to lower your blood pressure, make sure you know how and at what times they should be taken. Taking two medicines at the same time can result in blood pressure that is too low.  SEEK IMMEDIATE MEDICAL CARE IF:  You develop a severe headache, blurred or changing vision, or confusion.   You have unusual weakness or numbness, or a faint feeling.   You have severe chest or abdominal pain, vomiting, or breathing problems.  MAKE SURE YOU:   Understand these instructions.   Will watch your condition.   Will get help right away if you  are not doing well or get worse.  Document Released: 05/07/2005 Document Revised: 04/26/2011 Document Reviewed: 12/26/2007 [facility] Patient Information 2012 [place], [place].Diabetes, Type 2 Diabetes is a long-lasting (chronic) disease. In type 2 diabetes, the pancreas does not make enough insulin (a hormone), and the body does not respond normally to the insulin that is made. This type of diabetes was also previously called adult-onset diabetes. It usually occurs after the age of 29, but it can occur at any age.  CAUSES   Type 2 diabetes happens because the pancreasis not making enough insulin or your body has trouble using the insulin that your pancreas does make properly. SYMPTOMS   Drinking more than usual.   Urinating more than usual.   Blurred vision.   Dry, itchy skin.   Frequent infections.   Feeling more tired than usual (fatigue).  DIAGNOSIS The diagnosis of type 2 diabetes is usually made by one of the following tests:  Fasting blood glucose test. You will not eat for at least 8 hours and then take a blood test.   Random blood glucose test. Your blood glucose (sugar) is checked at any time of the day regardless of when you ate.   Oral glucose tolerance test (OGTT). Your blood glucose is measured after you have not eaten (fasted) and then after you drink a glucose containing beverage.  TREATMENT   Healthy eating.   Exercise.   Medicine, if needed.   Monitoring blood glucose.   Seeing your caregiver regularly.  HOME CARE INSTRUCTIONS   Check your blood glucose at least once a day. More frequent monitoring may be necessary, depending on your medicines and on how well your diabetes is controlled. Your caregiver will advise you.   Take your medicine as directed by your caregiver.   Do not smoke.   Make wise food choices. Ask your caregiver for information. Weight loss can improve your diabetes.   Learn about low blood glucose (hypoglycemia) and how to treat it.   Get your eyes checked regularly.   Have a yearly physical exam. Have your blood pressure checked and your blood and urine tested.   Wear a pendant or bracelet saying that you have diabetes.   Check your feet every night for cuts, sores, blisters, and redness. Let your caregiver know if you have any problems.  SEEK MEDICAL CARE IF:   You have problems keeping your blood glucose in target range.   You have problems with your medicines.   You have symptoms of an illness that do not improve after 24 hours.   You  have a sore or wound that is not healing.   You notice a change in vision or a new problem with your vision.   You have a fever.  MAKE SURE YOU:  Understand these instructions.   Will watch your condition.   Will get help right away if you are not doing well or get worse.  Document Released: 05/07/2005 Document Revised: 04/26/2011 Document Reviewed: 10/23/2010 [facility] Patient Information 2012 [place], [place].

## 2012-02-15 LAB — MICROALBUMIN / CREATININE URINE RATIO
Creatinine, Urine: 249 mg/dL
Microalb Creat Ratio: 4.8 mg/g (ref 0.0–30.0)
Microalb, Ur: 1.2 mg/dL (ref 0.00–1.89)

## 2012-02-22 ENCOUNTER — Ambulatory Visit
Admission: RE | Admit: 2012-02-22 | Discharge: 2012-02-22 | Disposition: A | Discharge status: Home / Self Care | Payer: BC Managed Care – PPO | Source: Ambulatory Visit | Attending: Emergency Medicine | Admitting: Emergency Medicine

## 2012-02-22 DIAGNOSIS — R200 Anesthesia of skin: Secondary | ICD-10-CM

## 2012-02-22 DIAGNOSIS — R51 Headache: Secondary | ICD-10-CM

## 2012-02-22 DIAGNOSIS — R202 Paresthesia of skin: Secondary | ICD-10-CM

## 2012-02-27 ENCOUNTER — Telehealth: Payer: Self-pay

## 2012-02-27 NOTE — Telephone Encounter (Signed)
Pt states she saw dr Cleta Alberts and he suggested that she been seen by another physician but has forgotten the names, and would like a call back to discuss this and she had her ct scan on Friday and would like  The results  Best number [phone]

## 2012-02-28 ENCOUNTER — Telehealth: Payer: Self-pay

## 2012-02-28 NOTE — Telephone Encounter (Signed)
Pt is scheduled to see dr Audria Nine on 03/06/12 by appt, but states she is concerned about the diabetes medication prescribed by dr Cleta Alberts. Pt states when she takes medication, it makes her stomach sore. Please call pt to advise what to do

## 2012-02-28 NOTE — Telephone Encounter (Signed)
Take after the largest meal of the day. The symptoms of this will improve over the next few days or week, she said she will try this.

## 2012-02-28 NOTE — Telephone Encounter (Signed)
Left message for her to advise CT was normal, I need her to call me back about the other, and to let me know how she is feeling/ Karen [name]

## 2012-03-06 ENCOUNTER — Ambulatory Visit: Payer: BC Managed Care – PPO | Admitting: Family Medicine

## 2012-03-27 ENCOUNTER — Ambulatory Visit: Payer: BC Managed Care – PPO | Admitting: Family Medicine

## 2012-03-28 ENCOUNTER — Ambulatory Visit: Payer: Self-pay | Admitting: Family Medicine

## 2012-05-07 ENCOUNTER — Ambulatory Visit (INDEPENDENT_AMBULATORY_CARE_PROVIDER_SITE_OTHER): Payer: BC Managed Care – PPO | Admitting: Emergency Medicine

## 2012-05-07 VITALS — BP 128/84 | HR 84 | Temp 98.4°F | Resp 17 | Ht 64.5 in | Wt 222.0 lb

## 2012-05-07 DIAGNOSIS — S025XXA Fracture of tooth (traumatic), initial encounter for closed fracture: Secondary | ICD-10-CM

## 2012-05-07 MED ORDER — CLINDAMYCIN HCL 300 MG PO CAPS: 300.0000 mg | ORAL_CAPSULE | Freq: Three times a day (TID) | ORAL | Status: DC

## 2012-05-07 MED ORDER — HYDROCODONE-ACETAMINOPHEN 5-500 MG PO TABS: 1.0000 | ORAL_TABLET | ORAL | Status: AC | PRN

## 2012-05-07 NOTE — Patient Instructions (Addendum)
Dental Fracture  You have a dental fracture or injury. This can mean the tooth is loose, has a chip in the enamel or is broken. If just the outer enamel is chipped, there is a good chance the tooth will not become infected. The only treatment needed may be to smooth off a rough edge. Fractures into the deeper layers (dentin and pulp) cause greater pain and are more likely to become infected. These require you to see a dentist as soon as possible to save the tooth.  Loose teeth may need to be wired or bonded with a plastic splint to hold them in place. A paste may be painted on the open area of the broken tooth to reduce the pain. Antibiotics and pain medicine may be prescribed. Choosing a soft or liquid diet and rinsing the mouth out with warm water after meals may be helpful.  See your dentist as recommended. Failure to seek care or follow up with a dentist or other specialist as recommended could result in the loss of your tooth, infection, or permanent dental problems.  SEEK MEDICAL CARE IF:    You have increased pain not controlled with medicines.   You have swelling around the tooth, in the face or neck.   You have bleeding which starts, continues, or gets worse.   You have a fever.  Document Released: 06/14/2004 Document Revised: 07/30/2011 Document Reviewed: 03/29/2009  [employer] Patient Information 2013 [employer].

## 2012-05-07 NOTE — Progress Notes (Signed)
[facility] [address], [place] [address] [phone]  Date:  05/07/2012   Name:  Karen [name]   DOB:  1961/05/20   MRN:  [id number]  PCP:  Ralene Ok, MD    Chief Complaint: swollen jaw   History of Present Illness:  Karen [name] is a 51 y.o. very pleasant female patient who presents with the following:  Moved here from [place] and had a partial plate.  She was forced to stop using it because a tooth broke off. Now she has pain in her jaw and fever and swelling.  No chills.  No facial reddness   Patient Active Problem List  Diagnosis  . Diabetes mellitus  . Hypertension    Past Medical History  Diagnosis Date  . Diabetes mellitus without complication   . Hypertension     Past Surgical History  Procedure Date  . Appendectomy     History  Substance Use Topics  . Smoking status: Current Every Day Smoker -- 0.7 packs/day for 30 years    Types: Cigarettes  . Smokeless tobacco: Not on file  . Alcohol Use: No    History reviewed. No pertinent family history.  Allergies  Allergen Reactions  . Penicillins Hives    Medication list has been reviewed and updated.  Current Outpatient Prescriptions on File Prior to Visit  Medication Sig Dispense Refill  . diphenhydramine-acetaminophen (TYLENOL PM) 25-500 MG TABS Take 1 tablet by mouth at bedtime as needed. Sleep      . losartan-hydrochlorothiazide (HYZAAR) 50-12.5 MG per tablet Take one tablet daily  30 tablet  5  . metFORMIN (GLUCOPHAGE) 500 MG tablet Take one a day for one week to increase to twice daily  60 tablet  5  . zolpidem (AMBIEN) 5 MG tablet Take 5 mg by mouth at bedtime as needed. Insomnia        Review of Systems:  As per HPI, otherwise negative.    Physical Examination: Filed Vitals:   05/07/12 1029  BP: 128/84  Pulse: 84  Temp: 98.4 F (36.9 C)  Resp: 17   Filed Vitals:   05/07/12 1029  Height: 5' 4.5" (1.638 m)  Weight: 222 lb (100.699 kg)   Body  mass index is 37.52 kg/(m^2). Ideal Body Weight: Weight in (lb) to have BMI = 25: 147.6    GEN: WDWN, NAD, Non-toxic, Alert & Oriented x 3 HEENT: Atraumatic, Normocephalic.  Fractured premolar and badly carious molar right maxillary teeth. Ears and Nose: No external deformity. EXTR: No clubbing/cyanosis/edema NEURO: Normal gait.  PSYCH: Normally interactive. Conversant. Not depressed or anxious appearing.  Calm demeanor.    Assessment and Plan: Dental caries Fractured tooth Clindamycin Penicillin Dental referral hydrocodone  Carmelina Dane, MD

## 2012-05-10 ENCOUNTER — Encounter (HOSPITAL_COMMUNITY): Payer: Self-pay | Admitting: *Deleted

## 2012-05-10 ENCOUNTER — Emergency Department (HOSPITAL_COMMUNITY): Payer: BC Managed Care – PPO

## 2012-05-10 ENCOUNTER — Emergency Department (HOSPITAL_COMMUNITY)
Admission: EM | Admit: 2012-05-10 | Discharge: 2012-05-10 | Disposition: A | Discharge status: Home / Self Care | Payer: BC Managed Care – PPO | Attending: Emergency Medicine | Admitting: Emergency Medicine

## 2012-05-10 DIAGNOSIS — R059 Cough, unspecified: Secondary | ICD-10-CM | POA: Insufficient documentation

## 2012-05-10 DIAGNOSIS — R509 Fever, unspecified: Secondary | ICD-10-CM | POA: Insufficient documentation

## 2012-05-10 DIAGNOSIS — IMO0001 Reserved for inherently not codable concepts without codable children: Secondary | ICD-10-CM | POA: Insufficient documentation

## 2012-05-10 DIAGNOSIS — R110 Nausea: Secondary | ICD-10-CM | POA: Insufficient documentation

## 2012-05-10 DIAGNOSIS — M549 Dorsalgia, unspecified: Secondary | ICD-10-CM | POA: Insufficient documentation

## 2012-05-10 DIAGNOSIS — I10 Essential (primary) hypertension: Secondary | ICD-10-CM | POA: Insufficient documentation

## 2012-05-10 DIAGNOSIS — F17200 Nicotine dependence, unspecified, uncomplicated: Secondary | ICD-10-CM | POA: Insufficient documentation

## 2012-05-10 DIAGNOSIS — E119 Type 2 diabetes mellitus without complications: Secondary | ICD-10-CM | POA: Insufficient documentation

## 2012-05-10 DIAGNOSIS — R05 Cough: Secondary | ICD-10-CM | POA: Insufficient documentation

## 2012-05-10 DIAGNOSIS — B349 Viral infection, unspecified: Secondary | ICD-10-CM

## 2012-05-10 DIAGNOSIS — J3489 Other specified disorders of nose and nasal sinuses: Secondary | ICD-10-CM | POA: Insufficient documentation

## 2012-05-10 DIAGNOSIS — Z79899 Other long term (current) drug therapy: Secondary | ICD-10-CM | POA: Insufficient documentation

## 2012-05-10 DIAGNOSIS — B9789 Other viral agents as the cause of diseases classified elsewhere: Secondary | ICD-10-CM | POA: Insufficient documentation

## 2012-05-10 DIAGNOSIS — R51 Headache: Secondary | ICD-10-CM | POA: Insufficient documentation

## 2012-05-10 LAB — GLUCOSE, CAPILLARY: Glucose-Capillary: 120 mg/dL — ABNORMAL HIGH (ref 70–99)

## 2012-05-10 MED ORDER — SODIUM CHLORIDE 0.9 % IV BOLUS (SEPSIS)
1000.0000 mL | Freq: Once | INTRAVENOUS | Status: AC
  Administered 2012-05-10: 1000 mL via INTRAVENOUS

## 2012-05-10 MED ORDER — IBUPROFEN 800 MG PO TABS
800.0000 mg | ORAL_TABLET | Freq: Once | ORAL | Status: AC
  Administered 2012-05-10: 800 mg via ORAL
  Filled 2012-05-10: qty 1

## 2012-05-10 MED ORDER — GUAIFENESIN-CODEINE 100-10 MG/5ML PO SYRP: 5.0000 mL | ORAL_SOLUTION | Freq: Three times a day (TID) | ORAL | Status: DC | PRN

## 2012-05-10 NOTE — ED Provider Notes (Signed)
History     CSN: [id number]  Arrival date & time 05/10/12  1112   First MD Initiated Contact with Patient 05/10/12 1139      Chief Complaint  Patient presents with  . Weakness    (Consider location/radiation/quality/duration/timing/severity/associated sxs/prior treatment) HPI Comments: Patient presents with flulike symptoms that started 3 days ago. She states that she HCL over and feeling weak. She's having a productive cough over last 3 days which is now productive of green sputum. She's having some subjective fevers. She has a bifrontal-type headache but no neck stiffness. She denies any rashes. She's had some nausea but no vomiting. She's had some loose stools but no significant diarrhea. She's not taking anything for the pain.  Patient is a 51 y.o. female presenting with weakness.  Weakness The primary symptoms include fever and nausea. Primary symptoms do not include headaches, dizziness or vomiting.  Additional symptoms include weakness.    Past Medical History  Diagnosis Date  . Diabetes mellitus without complication   . Hypertension     Past Surgical History  Procedure Date  . Appendectomy     No family history on file.  History  Substance Use Topics  . Smoking status: Current Every Day Smoker -- 0.7 packs/day for 30 years    Types: Cigarettes  . Smokeless tobacco: Not on file  . Alcohol Use: No    OB History    Grav Para Term Preterm Abortions TAB SAB Ect Mult Living                  Review of Systems  Constitutional: Positive for fever and fatigue. Negative for chills and diaphoresis.  HENT: Positive for congestion. Negative for rhinorrhea and sneezing.   Eyes: Negative.   Respiratory: Positive for cough. Negative for chest tightness and shortness of breath.   Cardiovascular: Negative for chest pain and leg swelling.  Gastrointestinal: Positive for nausea. Negative for vomiting, abdominal pain, diarrhea and blood in stool.  Genitourinary: Negative  for frequency, hematuria, flank pain and difficulty urinating.  Musculoskeletal: Positive for myalgias and back pain. Negative for arthralgias.  Skin: Negative for rash.  Neurological: Positive for weakness. Negative for dizziness, speech difficulty, numbness and headaches.    Allergies  Penicillins  Home Medications   Current Outpatient Rx  Name  Route  Sig  Dispense  Refill  . CLINDAMYCIN HCL 300 MG PO CAPS   Oral   Take 1 capsule (300 mg total) by mouth 3 (three) times daily.   30 capsule   0   . HYDROCODONE-ACETAMINOPHEN 5-500 MG PO TABS   Oral   Take 1 tablet by mouth every 4 (four) hours as needed for pain.   20 tablet   0   . LOSARTAN POTASSIUM-HCTZ 50-12.5 MG PO TABS   Oral   Take 1 tablet by mouth daily.         Marland Kitchen METFORMIN HCL 500 MG PO TABS   Oral   Take 500 mg by mouth 2 (two) times daily with a meal.         . GUAIFENESIN-CODEINE 100-10 MG/5ML PO SYRP   Oral   Take 5 mLs by mouth 3 (three) times daily as needed for cough.   120 mL   0   . ZOLPIDEM TARTRATE 5 MG PO TABS   Oral   Take 5 mg by mouth at bedtime as needed. Insomnia           BP 122/100  Pulse 102  Temp  99.5 F (37.5 C) (Oral)  Resp 18  Ht 5\' 3"  (1.6 m)  Wt 225 lb (102.059 kg)  BMI 39.86 kg/m2  SpO2 96%  Physical Exam  Constitutional: She is oriented to person, place, and time. She appears well-developed and well-nourished.  HENT:  Head: Normocephalic and atraumatic.  Right Ear: External ear normal.  Left Ear: External ear normal.  Mouth/Throat: Oropharynx is clear and moist.  Eyes: Pupils are equal, round, and reactive to light.  Neck: Normal range of motion. Neck supple.  Cardiovascular: Normal rate, regular rhythm and normal heart sounds.   Pulmonary/Chest: Effort normal and breath sounds normal. No respiratory distress. She has no wheezes. She has no rales. She exhibits no tenderness.  Abdominal: Soft. Bowel sounds are normal. There is no tenderness. There is no  rebound and no guarding.  Musculoskeletal: Normal range of motion. She exhibits no edema.  Lymphadenopathy:    She has no cervical adenopathy.  Neurological: She is alert and oriented to person, place, and time.  Skin: Skin is warm and dry. No rash noted.  Psychiatric: She has a normal mood and affect.    ED Course  Procedures (including critical care time)  Labs Reviewed  GLUCOSE, CAPILLARY - Abnormal; Notable for the following:    Glucose-Capillary 120 (*)     All other components within normal limits   Dg Chest 2 View  05/10/2012  *RADIOLOGY REPORT*  Clinical Data: Weakness, cough and fever.  CHEST - 2 VIEW  Comparison: Single view of the chest 01/18/2005.  Findings: Linear atelectasis or scar is seen in the right lung base.  Lungs are otherwise clear.  Heart size normal.  No pneumothorax. There may be a trace right pleural effusion.  Heart size normal.  IMPRESSION: Linear atelectasis or scar right lung base with possible trace right pleural effusion versus pleural thickening.   Original Report Authenticated By: Holley Dexter, M.D.     Date: 05/10/2012  Rate: 106  Rhythm: sinus tachycardia  QRS Axis: normal  Intervals: normal  ST/T Wave abnormalities: normal  Conduction Disutrbances:none  Narrative Interpretation:   Old EKG Reviewed: unchanged    1. Viral syndrome       MDM  Pt well appearing with influenza like symptoms.  Nothing suggestive of meningitis.  No pneumonia.  No hypoxia.  Pt is out of 48 hour window for tamiflu.  Advised symptomatic tx, given rx for cough syrup.  Advised to f/u with PMD or return here as needed.        Rolan Bucco, MD 05/10/12 1254

## 2012-05-10 NOTE — ED Notes (Signed)
Pt c/o of weakness and not eating in two days, c/o congestion and coughing

## 2012-05-11 ENCOUNTER — Other Ambulatory Visit: Payer: Self-pay | Admitting: Physician Assistant

## 2012-05-11 MED ORDER — OSELTAMIVIR PHOSPHATE 75 MG PO CAPS: 75.0000 mg | ORAL_CAPSULE | Freq: Two times a day (BID) | ORAL | Status: DC

## 2012-05-11 NOTE — Progress Notes (Signed)
Patient had definite exposure to flu.  She was in office 2 days ago.  She has fever,cough, body aches that came on suddenly. tamiflu sent

## 2012-05-12 ENCOUNTER — Ambulatory Visit (INDEPENDENT_AMBULATORY_CARE_PROVIDER_SITE_OTHER): Payer: BC Managed Care – PPO | Admitting: Emergency Medicine

## 2012-05-12 VITALS — BP 107/75 | HR 92 | Temp 98.3°F | Resp 16 | Ht 63.0 in | Wt 215.4 lb

## 2012-05-12 DIAGNOSIS — J111 Influenza due to unidentified influenza virus with other respiratory manifestations: Secondary | ICD-10-CM

## 2012-05-12 MED ORDER — HYDROCOD POLST-CHLORPHEN POLST 10-8 MG/5ML PO LQCR: 5.0000 mL | Freq: Two times a day (BID) | ORAL | Status: DC | PRN

## 2012-05-12 NOTE — Progress Notes (Signed)
[facility] [address], [place] [address] [phone]  Date:  05/12/2012   Name:  Naoma Boxell   DOB:  11/12/1960   MRN:  [id number]  PCP:  Ralene Ok, MD    Chief Complaint: Cough   History of Present Illness:  Virginie Josten is a 51 y.o. very pleasant female patient who presents with the following:  Seen in ER two days ago and treated for a "viral syndrome" although she says she was told that she had the flu.  Called a prescription in last night for tamiflu that she failed to pick up.  She became ill Friday and has had a fever since.  No nausea or vomiting.  No diarrhea, wheezing or shortness of breath.  No improvement with OTC medications.  Generalized aches and malaise.  Poor po intake  Patient Active Problem List  Diagnosis  . Diabetes mellitus  . Hypertension    Past Medical History  Diagnosis Date  . Diabetes mellitus without complication   . Hypertension     Past Surgical History  Procedure Date  . Appendectomy     History  Substance Use Topics  . Smoking status: Current Every Day Smoker -- 0.7 packs/day for 30 years    Types: Cigarettes  . Smokeless tobacco: Not on file  . Alcohol Use: No    No family history on file.  Allergies  Allergen Reactions  . Penicillins Hives    Medication list has been reviewed and updated.  Current Outpatient Prescriptions on File Prior to Visit  Medication Sig Dispense Refill  . clindamycin (CLEOCIN) 300 MG capsule Take 1 capsule (300 mg total) by mouth 3 (three) times daily.  30 capsule  0  . guaiFENesin-codeine (ROBITUSSIN AC) 100-10 MG/5ML syrup Take 5 mLs by mouth 3 (three) times daily as needed for cough.  120 mL  0  . HYDROcodone-acetaminophen (VICODIN) 5-500 MG per tablet Take 1 tablet by mouth every 4 (four) hours as needed for pain.  20 tablet  0  . losartan-hydrochlorothiazide (HYZAAR) 50-12.5 MG per tablet Take 1 tablet by mouth daily.      . metFORMIN (GLUCOPHAGE) 500 MG  tablet Take 500 mg by mouth 2 (two) times daily with a meal.      . oseltamivir (TAMIFLU) 75 MG capsule Take 1 capsule (75 mg total) by mouth 2 (two) times daily.  14 capsule  0  . zolpidem (AMBIEN) 5 MG tablet Take 5 mg by mouth at bedtime as needed. Insomnia        Review of Systems:  As per HPI, otherwise negative.    Physical Examination: Filed Vitals:   05/12/12 1116  BP: 107/75  Pulse: 92  Temp: 98.3 F (36.8 C)  Resp: 16   Filed Vitals:   05/12/12 1116  Height: 5\' 3"  (1.6 m)  Weight: 215 lb 6.4 oz (97.705 kg)   Body mass index is 38.16 kg/(m^2). Ideal Body Weight: Weight in (lb) to have BMI = 25: 140.8   GEN: WDWN, NAD, Non-toxic, A & O x 3 HEENT: Atraumatic, Normocephalic. Neck supple. No masses, No LAD. Ears and Nose: No external deformity. CV: RRR, No M/G/R. No JVD. No thrill. No extra heart sounds. PULM: CTA B, no wheezes, crackles, rhonchi. No retractions. No resp. distress. No accessory muscle use. ABD: S, NT, ND, +BS. No rebound. No HSM. EXTR: No c/c/e NEURO Normal gait.  PSYCH: Normally interactive. Conversant. Not depressed or anxious appearing.  Calm demeanor.  Assessment and Plan: Influenza tussionex Tylenol or motrin Increase liquids  Carmelina Dane, MD

## 2012-05-12 NOTE — Patient Instructions (Signed)

## 2012-05-16 ENCOUNTER — Ambulatory Visit: Payer: BC Managed Care – PPO | Admitting: Family Medicine

## 2012-08-13 ENCOUNTER — Telehealth: Payer: Self-pay

## 2012-08-13 MED ORDER — METFORMIN HCL 500 MG PO TABS: 500.0000 mg | ORAL_TABLET | Freq: Two times a day (BID) | ORAL | Status: DC

## 2012-08-13 MED ORDER — LOSARTAN POTASSIUM-HCTZ 50-12.5 MG PO TABS: 1.0000 | ORAL_TABLET | Freq: Every day | ORAL | Status: DC

## 2012-08-13 NOTE — Telephone Encounter (Signed)
Please advise on renewals of these medications.

## 2012-08-13 NOTE — Telephone Encounter (Signed)
Patient advised.

## 2012-08-13 NOTE — Telephone Encounter (Signed)
Patient went to [facility] to pick up her medicines, they had already put them back on the shelf.  Someone needs to resend them on behalf of the patient.  metFORMIN (GLUCOPHAGE) 500 MG tablet losartan-hydrochlorothiazide (HYZAAR) 50-12.5 MG per tablet   [phone]

## 2012-08-13 NOTE — Telephone Encounter (Signed)
Sent to pharmacy.  Pt due for OV, labs before this runs out

## 2013-01-10 ENCOUNTER — Emergency Department (HOSPITAL_COMMUNITY)
Admission: EM | Admit: 2013-01-10 | Discharge: 2013-01-11 | Disposition: A | Discharge status: Home / Self Care | Payer: BC Managed Care – PPO | Attending: Emergency Medicine | Admitting: Emergency Medicine

## 2013-01-10 ENCOUNTER — Encounter (HOSPITAL_COMMUNITY): Payer: Self-pay | Admitting: *Deleted

## 2013-01-10 DIAGNOSIS — Z79899 Other long term (current) drug therapy: Secondary | ICD-10-CM | POA: Insufficient documentation

## 2013-01-10 DIAGNOSIS — I10 Essential (primary) hypertension: Secondary | ICD-10-CM | POA: Insufficient documentation

## 2013-01-10 DIAGNOSIS — Z880 Allergy status to penicillin: Secondary | ICD-10-CM | POA: Insufficient documentation

## 2013-01-10 DIAGNOSIS — K5289 Other specified noninfective gastroenteritis and colitis: Secondary | ICD-10-CM | POA: Insufficient documentation

## 2013-01-10 DIAGNOSIS — K529 Noninfective gastroenteritis and colitis, unspecified: Secondary | ICD-10-CM

## 2013-01-10 DIAGNOSIS — F17200 Nicotine dependence, unspecified, uncomplicated: Secondary | ICD-10-CM | POA: Insufficient documentation

## 2013-01-10 DIAGNOSIS — E119 Type 2 diabetes mellitus without complications: Secondary | ICD-10-CM | POA: Insufficient documentation

## 2013-01-10 LAB — CBC WITH DIFFERENTIAL/PLATELET
Basophils Absolute: 0 10*3/uL (ref 0.0–0.1)
Basophils Relative: 0 % (ref 0–1)
Eosinophils Absolute: 0.1 10*3/uL (ref 0.0–0.7)
Eosinophils Relative: 1 % (ref 0–5)
HCT: 36.8 % (ref 36.0–46.0)
Hemoglobin: 13 g/dL (ref 12.0–15.0)
Lymphocytes Relative: 15 % (ref 12–46)
Lymphs Abs: 2.5 10*3/uL (ref 0.7–4.0)
MCH: 24.8 pg — ABNORMAL LOW (ref 26.0–34.0)
MCHC: 35.3 g/dL (ref 30.0–36.0)
MCV: 70.2 fL — ABNORMAL LOW (ref 78.0–100.0)
Monocytes Absolute: 0.9 10*3/uL (ref 0.1–1.0)
Monocytes Relative: 5 % (ref 3–12)
Neutro Abs: 13.2 10*3/uL — ABNORMAL HIGH (ref 1.7–7.7)
Neutrophils Relative %: 79 % — ABNORMAL HIGH (ref 43–77)
Platelets: 274 10*3/uL (ref 150–400)
RBC: 5.24 MIL/uL — ABNORMAL HIGH (ref 3.87–5.11)
RDW: 15.7 % — ABNORMAL HIGH (ref 11.5–15.5)
WBC: 16.7 10*3/uL — ABNORMAL HIGH (ref 4.0–10.5)

## 2013-01-10 LAB — COMPREHENSIVE METABOLIC PANEL
ALT: 16 U/L (ref 0–35)
AST: 15 U/L (ref 0–37)
Albumin: 3.5 g/dL (ref 3.5–5.2)
Alkaline Phosphatase: 118 U/L — ABNORMAL HIGH (ref 39–117)
BUN: 12 mg/dL (ref 6–23)
CO2: 24 mEq/L (ref 19–32)
Calcium: 9.9 mg/dL (ref 8.4–10.5)
Chloride: 98 mEq/L (ref 96–112)
Creatinine, Ser: 1.55 mg/dL — ABNORMAL HIGH (ref 0.50–1.10)
GFR calc Af Amer: 44 mL/min — ABNORMAL LOW (ref >90)
GFR calc non Af Amer: 38 mL/min — ABNORMAL LOW (ref >90)
Glucose, Bld: 147 mg/dL — ABNORMAL HIGH (ref 70–99)
Potassium: 3.6 mEq/L (ref 3.5–5.1)
Sodium: 136 mEq/L (ref 135–145)
Total Bilirubin: 0.4 mg/dL (ref 0.3–1.2)
Total Protein: 8.3 g/dL (ref 6.0–8.3)

## 2013-01-10 LAB — LIPASE, BLOOD: Lipase: 28 U/L (ref 11–59)

## 2013-01-10 MED ORDER — ONDANSETRON HCL 4 MG/2ML IJ SOLN
4.0000 mg | Freq: Once | INTRAMUSCULAR | Status: AC
  Administered 2013-01-10: 4 mg via INTRAVENOUS
  Filled 2013-01-10: qty 2

## 2013-01-10 MED ORDER — MORPHINE SULFATE 4 MG/ML IJ SOLN
4.0000 mg | Freq: Once | INTRAMUSCULAR | Status: AC
  Administered 2013-01-10: 4 mg via INTRAVENOUS
  Filled 2013-01-10: qty 1

## 2013-01-10 MED ORDER — SODIUM CHLORIDE 0.9 % IV BOLUS (SEPSIS)
1000.0000 mL | Freq: Once | INTRAVENOUS | Status: AC
  Administered 2013-01-10: 1000 mL via INTRAVENOUS

## 2013-01-10 NOTE — ED Notes (Signed)
Per EMS report: pt from home: Sudden onset of N/V/D (explosive vomiting) about an hour prior to arrival. Pt reports some abd pain and tingling in hands.  On arrival to ED, pt denies any abd pain.  EMS did not observed any N/V enroute.  Pt a/o x 4 and skin warm and dry.  Pt reports eating an egg sandwich for lunch.  Pt reports feeling better after vomiting.

## 2013-01-10 NOTE — ED Notes (Signed)
Bed: NW29 Expected date: 01/10/13 Expected time: 9:40 PM Means of arrival: Ambulance Comments: Bed 15, EMS, 51 F, n/v

## 2013-01-10 NOTE — ED Notes (Signed)
Pt given a sprite 

## 2013-01-10 NOTE — ED Provider Notes (Signed)
CSN: [id number]     Arrival date & time 01/10/13  2147 History     First MD Initiated Contact with Patient 01/10/13 2152     Chief Complaint  Patient presents with  . Nausea  . Emesis   (Consider location/radiation/quality/duration/timing/severity/associated sxs/prior Treatment) HPI Comments: Patient presents emergency department with chief complaint of sudden onset nausea, vomiting, and diarrhea that began this evening. She denies seeing any blood in her vomit or stool. She denies any fevers or chills. She states that she has felt slightly dizzy and sweaty. She denies being in pain having a mild headache and some crampy abdominal which she believes is related to vomiting. Nothing makes her symptoms better or worse. She has not tried anything to alleviate her symptoms.  The history is provided by the patient. No language interpreter was used.    Past Medical History  Diagnosis Date  . Diabetes mellitus without complication   . Hypertension    Past Surgical History  Procedure Laterality Date  . Appendectomy     No family history on file. History  Substance Use Topics  . Smoking status: Current Every Day Smoker -- 0.70 packs/day for 30 years    Types: Cigarettes  . Smokeless tobacco: Not on file  . Alcohol Use: No   OB History   Grav Para Term Preterm Abortions TAB SAB Ect Mult Living                 Review of Systems  All other systems reviewed and are negative.    Allergies  Penicillins  Home Medications   Current Outpatient Rx  Name  Route  Sig  Dispense  Refill  . chlorpheniramine-HYDROcodone (TUSSIONEX PENNKINETIC ER) 10-8 MG/5ML LQCR   Oral   Take 5 mLs by mouth every 12 (twelve) hours as needed (cough).   60 mL   0   . clindamycin (CLEOCIN) 300 MG capsule   Oral   Take 1 capsule (300 mg total) by mouth 3 (three) times daily.   30 capsule   0   . guaiFENesin-codeine (ROBITUSSIN AC) 100-10 MG/5ML syrup   Oral   Take 5 mLs by mouth 3 (three) times  daily as needed for cough.   120 mL   0   . losartan-hydrochlorothiazide (HYZAAR) 50-12.5 MG per tablet   Oral   Take 1 tablet by mouth daily. NEED VISIT/LABS!   30 tablet   0   . metFORMIN (GLUCOPHAGE) 500 MG tablet   Oral   Take 1 tablet (500 mg total) by mouth 2 (two) times daily with a meal.   60 tablet   0   . oseltamivir (TAMIFLU) 75 MG capsule   Oral   Take 1 capsule (75 mg total) by mouth 2 (two) times daily.   14 capsule   0   . zolpidem (AMBIEN) 5 MG tablet   Oral   Take 5 mg by mouth at bedtime as needed. Insomnia          BP 92/57  Pulse 90  Temp(Src) 97.6 F (36.4 C) (Oral)  Resp 16  Ht 5\' 3"  (1.6 m)  Wt 215 lb (97.523 kg)  BMI 38.09 kg/m2  SpO2 98% Physical Exam  Nursing note and vitals reviewed. Constitutional: She is oriented to person, place, and time. She appears well-developed and well-nourished.  HENT:  Head: Normocephalic and atraumatic.  Eyes: Conjunctivae and EOM are normal. Pupils are equal, round, and reactive to light.  Neck: Normal range of motion. Neck  supple.  Cardiovascular: Normal rate and regular rhythm.  Exam reveals no gallop and no friction rub.   No murmur heard. Pulmonary/Chest: Effort normal and breath sounds normal. No respiratory distress. She has no wheezes. She has no rales. She exhibits no tenderness.  Abdominal: Soft. Bowel sounds are normal. She exhibits no distension and no mass. There is no tenderness. There is no rebound and no guarding.  Crampy abdominal pain, no focal abdominal tenderness, or signs of peritonitis or surgical abdomen  Musculoskeletal: Normal range of motion. She exhibits no edema and no tenderness.  Neurological: She is alert and oriented to person, place, and time.  Skin: Skin is warm and dry.  Psychiatric: She has a normal mood and affect. Her behavior is normal. Judgment and thought content normal.    ED Course   Procedures (including critical care time)  Labs Reviewed  CBC WITH  DIFFERENTIAL  COMPREHENSIVE METABOLIC PANEL  LIPASE, BLOOD  URINALYSIS, ROUTINE W REFLEX MICROSCOPIC   Results for orders placed during the hospital encounter of 01/10/13  CBC WITH DIFFERENTIAL      Result Value Range   WBC 16.7 (*) 4.0 - 10.5 K/uL   RBC 5.24 (*) 3.87 - 5.11 MIL/uL   Hemoglobin 13.0  12.0 - 15.0 g/dL   HCT 16.1  09.6 - 04.5 %   MCV 70.2 (*) 78.0 - 100.0 fL   MCH 24.8 (*) 26.0 - 34.0 pg   MCHC 35.3  30.0 - 36.0 g/dL   RDW 40.9 (*) 81.1 - 91.4 %   Platelets 274  150 - 400 K/uL   Neutrophils Relative % 79 (*) 43 - 77 %   Neutro Abs 13.2 (*) 1.7 - 7.7 K/uL   Lymphocytes Relative 15  12 - 46 %   Lymphs Abs 2.5  0.7 - 4.0 K/uL   Monocytes Relative 5  3 - 12 %   Monocytes Absolute 0.9  0.1 - 1.0 K/uL   Eosinophils Relative 1  0 - 5 %   Eosinophils Absolute 0.1  0.0 - 0.7 K/uL   Basophils Relative 0  0 - 1 %   Basophils Absolute 0.0  0.0 - 0.1 K/uL  COMPREHENSIVE METABOLIC PANEL      Result Value Range   Sodium 136  135 - 145 mEq/L   Potassium 3.6  3.5 - 5.1 mEq/L   Chloride 98  96 - 112 mEq/L   CO2 24  19 - 32 mEq/L   Glucose, Bld 147 (*) 70 - 99 mg/dL   BUN 12  6 - 23 mg/dL   Creatinine, Ser 7.82 (*) 0.50 - 1.10 mg/dL   Calcium 9.9  8.4 - 95.6 mg/dL   Total Protein 8.3  6.0 - 8.3 g/dL   Albumin 3.5  3.5 - 5.2 g/dL   AST 15  0 - 37 U/L   ALT 16  0 - 35 U/L   Alkaline Phosphatase 118 (*) 39 - 117 U/L   Total Bilirubin 0.4  0.3 - 1.2 mg/dL   GFR calc non Af Amer 38 (*) >90 mL/min   GFR calc Af Amer 44 (*) >90 mL/min  LIPASE, BLOOD      Result Value Range   Lipase 28  11 - 59 U/L     1. Gastroenteritis     MDM  Patient with nausea, vomiting, diarrhea, that began suddenly today. She states that she had a egg salad sandwich earlier this morning. No hematemesis or hematochezia. Patient is tolerating by mouth intake.  She does appear dehydrated, with giving her fluids. Labs are reassuring, suspect this is likely viral.  12:34 AM Recheck of the abdomen  reveals no focal abdominal tenderness. Patient still tolerating by mouth.  12:52 AM Discussed the patient with Dr. Jeraldine Loots, who agrees that if the UA is clean, then the patient may be discharged to home with PCP follow-up for repeat creatinine check in 2 days.  Patient discussed with Piepenbrink, PA-C, who will continue care.  Plan:  Finish fluids Follow up on UA, if clean->home, if positive treat for pyelo.  Roxy Horseman, PA-C 01/11/13 0104

## 2013-01-10 NOTE — ED Notes (Signed)
2 unsucessful IV attempts.

## 2013-01-11 ENCOUNTER — Encounter (HOSPITAL_COMMUNITY): Payer: Self-pay | Admitting: *Deleted

## 2013-01-11 LAB — URINALYSIS, ROUTINE W REFLEX MICROSCOPIC
Bilirubin Urine: NEGATIVE
Glucose, UA: NEGATIVE mg/dL
Hgb urine dipstick: NEGATIVE
Ketones, ur: NEGATIVE mg/dL
Leukocytes, UA: NEGATIVE
Nitrite: NEGATIVE
Protein, ur: NEGATIVE mg/dL
Specific Gravity, Urine: 1.018 (ref 1.005–1.030)
Urobilinogen, UA: 0.2 mg/dL (ref 0.0–1.0)
pH: 5 (ref 5.0–8.0)

## 2013-01-11 MED ORDER — SODIUM CHLORIDE 0.9 % IV BOLUS (SEPSIS)
1000.0000 mL | Freq: Once | INTRAVENOUS | Status: AC
  Administered 2013-01-11: 1000 mL via INTRAVENOUS

## 2013-01-11 MED ORDER — ONDANSETRON HCL 4 MG PO TABS: 4.0000 mg | ORAL_TABLET | Freq: Three times a day (TID) | ORAL | Status: DC | PRN

## 2013-01-11 NOTE — ED Notes (Signed)
Pt able to tolerate a sprite without emesis.

## 2013-01-13 ENCOUNTER — Encounter (HOSPITAL_COMMUNITY): Payer: Self-pay

## 2013-01-13 ENCOUNTER — Emergency Department (HOSPITAL_COMMUNITY)
Admission: EM | Admit: 2013-01-13 | Discharge: 2013-01-14 | Disposition: A | Discharge status: Home / Self Care | Payer: BC Managed Care – PPO | Attending: Emergency Medicine | Admitting: Emergency Medicine

## 2013-01-13 DIAGNOSIS — Z87891 Personal history of nicotine dependence: Secondary | ICD-10-CM | POA: Insufficient documentation

## 2013-01-13 DIAGNOSIS — Z880 Allergy status to penicillin: Secondary | ICD-10-CM | POA: Insufficient documentation

## 2013-01-13 DIAGNOSIS — R110 Nausea: Secondary | ICD-10-CM | POA: Insufficient documentation

## 2013-01-13 DIAGNOSIS — R630 Anorexia: Secondary | ICD-10-CM | POA: Insufficient documentation

## 2013-01-13 DIAGNOSIS — K529 Noninfective gastroenteritis and colitis, unspecified: Secondary | ICD-10-CM

## 2013-01-13 DIAGNOSIS — K5289 Other specified noninfective gastroenteritis and colitis: Secondary | ICD-10-CM | POA: Insufficient documentation

## 2013-01-13 DIAGNOSIS — R197 Diarrhea, unspecified: Secondary | ICD-10-CM

## 2013-01-13 DIAGNOSIS — R51 Headache: Secondary | ICD-10-CM | POA: Insufficient documentation

## 2013-01-13 DIAGNOSIS — I10 Essential (primary) hypertension: Secondary | ICD-10-CM | POA: Insufficient documentation

## 2013-01-13 DIAGNOSIS — E119 Type 2 diabetes mellitus without complications: Secondary | ICD-10-CM | POA: Insufficient documentation

## 2013-01-13 DIAGNOSIS — E860 Dehydration: Secondary | ICD-10-CM | POA: Insufficient documentation

## 2013-01-13 DIAGNOSIS — R5381 Other malaise: Secondary | ICD-10-CM | POA: Insufficient documentation

## 2013-01-13 LAB — CBC WITH DIFFERENTIAL/PLATELET
Basophils Absolute: 0 10*3/uL (ref 0.0–0.1)
Basophils Relative: 0 % (ref 0–1)
Eosinophils Absolute: 0 10*3/uL (ref 0.0–0.7)
Eosinophils Relative: 0 % (ref 0–5)
HCT: 36.8 % (ref 36.0–46.0)
Hemoglobin: 12.9 g/dL (ref 12.0–15.0)
Lymphocytes Relative: 19 % (ref 12–46)
Lymphs Abs: 2.6 10*3/uL (ref 0.7–4.0)
MCH: 24.4 pg — ABNORMAL LOW (ref 26.0–34.0)
MCHC: 35.1 g/dL (ref 30.0–36.0)
MCV: 69.6 fL — ABNORMAL LOW (ref 78.0–100.0)
Monocytes Absolute: 1 10*3/uL (ref 0.1–1.0)
Monocytes Relative: 7 % (ref 3–12)
Neutro Abs: 10 10*3/uL — ABNORMAL HIGH (ref 1.7–7.7)
Neutrophils Relative %: 74 % (ref 43–77)
Platelets: 285 10*3/uL (ref 150–400)
RBC: 5.29 MIL/uL — ABNORMAL HIGH (ref 3.87–5.11)
RDW: 15.3 % (ref 11.5–15.5)
WBC: 13.6 10*3/uL — ABNORMAL HIGH (ref 4.0–10.5)

## 2013-01-13 LAB — URINE MICROSCOPIC-ADD ON

## 2013-01-13 LAB — COMPREHENSIVE METABOLIC PANEL
ALT: 15 U/L (ref 0–35)
AST: 16 U/L (ref 0–37)
Albumin: 3.6 g/dL (ref 3.5–5.2)
Alkaline Phosphatase: 118 U/L — ABNORMAL HIGH (ref 39–117)
BUN: 12 mg/dL (ref 6–23)
CO2: 27 mEq/L (ref 19–32)
Calcium: 9.7 mg/dL (ref 8.4–10.5)
Chloride: 92 mEq/L — ABNORMAL LOW (ref 96–112)
Creatinine, Ser: 0.94 mg/dL (ref 0.50–1.10)
GFR calc Af Amer: 81 mL/min — ABNORMAL LOW (ref >90)
GFR calc non Af Amer: 70 mL/min — ABNORMAL LOW (ref >90)
Glucose, Bld: 100 mg/dL — ABNORMAL HIGH (ref 70–99)
Potassium: 3.1 mEq/L — ABNORMAL LOW (ref 3.5–5.1)
Sodium: 134 mEq/L — ABNORMAL LOW (ref 135–145)
Total Bilirubin: 0.7 mg/dL (ref 0.3–1.2)
Total Protein: 8.4 g/dL — ABNORMAL HIGH (ref 6.0–8.3)

## 2013-01-13 LAB — URINALYSIS, ROUTINE W REFLEX MICROSCOPIC
Glucose, UA: NEGATIVE mg/dL
Ketones, ur: >80 mg/dL — AB
Leukocytes, UA: NEGATIVE
Nitrite: NEGATIVE
Protein, ur: NEGATIVE mg/dL
Specific Gravity, Urine: 1.022 (ref 1.005–1.030)
Urobilinogen, UA: 0.2 mg/dL (ref 0.0–1.0)
pH: 5.5 (ref 5.0–8.0)

## 2013-01-13 MED ORDER — SODIUM CHLORIDE 0.9 % IV BOLUS (SEPSIS)
1000.0000 mL | Freq: Once | INTRAVENOUS | Status: AC
  Administered 2013-01-13: 1000 mL via INTRAVENOUS

## 2013-01-13 MED ORDER — LOPERAMIDE HCL 2 MG PO CAPS
2.0000 mg | ORAL_CAPSULE | Freq: Once | ORAL | Status: AC
  Administered 2013-01-13: 2 mg via ORAL
  Filled 2013-01-13: qty 1

## 2013-01-13 NOTE — ED Provider Notes (Signed)
CSN: [id number]     Arrival date & time 01/13/13  1856 History   First MD Initiated Contact with Patient 01/13/13 1942     Chief Complaint  Patient presents with  . Diarrhea  . Weakness   (Consider location/radiation/quality/duration/timing/severity/associated sxs/prior Treatment) HPI This is a 52 year old female who presents with diarrhea and weakness. The patient states that she was seen here on Saturday for similar symptoms. At that time she is just having vomiting. She reports that she felt better yesterday but today has had increasing weakness, decreased by mouth intake, and non-bloody diarrhea. She denies any fevers. 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 She denies any chest pain, shortness of breath, urinary symptoms, focal weakness or numbness. She does endorse mild headache. Past Medical History  Diagnosis Date  . Diabetes mellitus without complication   . Hypertension    Past Surgical History  Procedure Laterality Date  . Appendectomy    . Abdominal hysterectomy     No family history on file. History  Substance Use Topics  . Smoking status: Former Smoker -- 0.70 packs/day for 30 years    Types: Cigarettes  . Smokeless tobacco: Not on file  . Alcohol Use: No   OB History   Grav Para Term Preterm Abortions TAB SAB Ect Mult Living                 Review of Systems  Constitutional: Negative for fever.  Respiratory: Negative for cough, chest tightness and shortness of breath.   Cardiovascular: Negative for chest pain.  Gastrointestinal: Positive for nausea and diarrhea. Negative for vomiting and abdominal pain.  Genitourinary: Negative for dysuria.  Musculoskeletal: Negative for back pain.  Skin: Negative for wound.  Neurological: Positive for headaches. Negative for dizziness and syncope.  Psychiatric/Behavioral: Negative for confusion.  All other systems reviewed and are negative.    Allergies   Penicillins  Home Medications   Current Outpatient Rx  Name  Route  Sig  Dispense  Refill  . losartan-hydrochlorothiazide (HYZAAR) 50-12.5 MG per tablet   Oral   Take 1 tablet by mouth daily. NEED VISIT/LABS!   30 tablet   0   . metFORMIN (GLUCOPHAGE) 500 MG tablet   Oral   Take 1 tablet (500 mg total) by mouth 2 (two) times daily with a meal.   60 tablet   0   . ondansetron (ZOFRAN) 4 MG tablet   Oral   Take 1 tablet (4 mg total) by mouth every 8 (eight) hours as needed for nausea.   10 tablet   0   . zolpidem (AMBIEN) 5 MG tablet   Oral   Take 5 mg by mouth at bedtime as needed. Insomnia          BP 122/53  Pulse 93  Temp(Src) 100.3 F (37.9 C) (Oral)  Resp 18  SpO2 96% Physical Exam  Nursing note and vitals reviewed. Constitutional: She is oriented to person, place, and time. She appears well-developed and well-nourished. No distress.  HENT:  Head: Normocephalic and atraumatic.  Mucous membranes dry  Eyes: Pupils are equal, round, and reactive to light.  Neck: Neck supple.  Cardiovascular: Regular rhythm and normal heart sounds.   Tachycardia  Pulmonary/Chest: Effort normal and breath sounds normal. No respiratory distress. She has no wheezes.  Abdominal: Soft. Bowel sounds are normal. She exhibits no distension. There is no tenderness. There is no rebound and no guarding.  Neurological: She is alert and oriented to person, place, and time.  Skin: Skin is warm and dry.  Psychiatric: She has a normal mood and affect.    ED Course  Procedures (including critical care time) Labs Review Labs Reviewed  CBC WITH DIFFERENTIAL - Abnormal; Notable for the following:    WBC 13.6 (*)    RBC 5.29 (*)    MCV 69.6 (*)    MCH 24.4 (*)    Neutro Abs 10.0 (*)    All other components within normal limits  COMPREHENSIVE METABOLIC PANEL - Abnormal; Notable for the following:    Sodium 134 (*)    Potassium 3.1 (*)    Chloride 92 (*)    Glucose, Bld 100 (*)     Total Protein 8.4 (*)    Alkaline Phosphatase 118 (*)    GFR calc non Af Amer 70 (*)    GFR calc Af Amer 81 (*)    All other components within normal limits  URINALYSIS, ROUTINE W REFLEX MICROSCOPIC - Abnormal; Notable for the following:    APPearance CLOUDY (*)    Hgb urine dipstick TRACE (*)    Bilirubin Urine SMALL (*)    Ketones, ur >80 (*)    All other components within normal limits  URINE MICROSCOPIC-ADD ON - Abnormal; Notable for the following:    Squamous Epithelial / LPF FEW (*)    All other components within normal limits   Imaging Review No results found.   MDM   1. Diarrhea   2. Gastroenteritis   3. Dehydration    This is a 52 year old female who presents with diarrhea and fever. Patient's initial vital signs were notable for tachycardia. Patient appears dry exam and urinalysis shows ketones. Patient received 2 L of fluid. She had improvement of her symptoms. Repeat vitals showed temperature 100.3 and pulse of 93.  The patient is nontoxic-appearing. Given improvement of her symptoms, so she said for discharge home. Her vomiting and diarrhea are most consistent with acute viral syndrome given her recent sick contacts. Low suspicion for intra-abdominal pathology. Patient will be discharged home.  After history, exam, and medical workup I feel the patient has been appropriately medically screened and is safe for discharge home. Pertinent diagnoses were discussed with the patient. Patient was given return precautions.    Shon Baton, MD 01/13/13 [phone]

## 2013-01-13 NOTE — ED Notes (Signed)
Pt states was here Saturday for vomiting, states now has had diarrhea x2days, c/o weakness

## 2013-01-13 NOTE — ED Notes (Signed)
Pt c/o severe headache, pulse 129

## 2013-01-14 MED ORDER — ACETAMINOPHEN 325 MG PO TABS
ORAL_TABLET | ORAL | Status: AC
  Administered 2013-01-13: 650 mg
  Filled 2013-01-14: qty 2

## 2013-01-15 NOTE — ED Provider Notes (Signed)
Medical screening examination/treatment/procedure(s) were performed by non-physician practitioner and as supervising physician I was immediately available for consultation/collaboration.  Raeford Razor, MD 01/15/13 [phone]

## 2013-02-06 ENCOUNTER — Encounter (HOSPITAL_COMMUNITY): Payer: Self-pay | Admitting: Emergency Medicine

## 2013-02-06 ENCOUNTER — Emergency Department (HOSPITAL_COMMUNITY)
Admission: EM | Admit: 2013-02-06 | Discharge: 2013-02-06 | Disposition: A | Discharge status: Home / Self Care | Payer: BC Managed Care – PPO | Attending: Emergency Medicine | Admitting: Emergency Medicine

## 2013-02-06 DIAGNOSIS — Z87891 Personal history of nicotine dependence: Secondary | ICD-10-CM | POA: Insufficient documentation

## 2013-02-06 DIAGNOSIS — Z880 Allergy status to penicillin: Secondary | ICD-10-CM | POA: Insufficient documentation

## 2013-02-06 DIAGNOSIS — Z9089 Acquired absence of other organs: Secondary | ICD-10-CM | POA: Insufficient documentation

## 2013-02-06 DIAGNOSIS — E119 Type 2 diabetes mellitus without complications: Secondary | ICD-10-CM | POA: Insufficient documentation

## 2013-02-06 DIAGNOSIS — Z90710 Acquired absence of both cervix and uterus: Secondary | ICD-10-CM | POA: Insufficient documentation

## 2013-02-06 DIAGNOSIS — T383X5A Adverse effect of insulin and oral hypoglycemic [antidiabetic] drugs, initial encounter: Secondary | ICD-10-CM | POA: Insufficient documentation

## 2013-02-06 DIAGNOSIS — Z79899 Other long term (current) drug therapy: Secondary | ICD-10-CM | POA: Insufficient documentation

## 2013-02-06 DIAGNOSIS — I10 Essential (primary) hypertension: Secondary | ICD-10-CM | POA: Insufficient documentation

## 2013-02-06 DIAGNOSIS — R55 Syncope and collapse: Secondary | ICD-10-CM | POA: Insufficient documentation

## 2013-02-06 DIAGNOSIS — R197 Diarrhea, unspecified: Secondary | ICD-10-CM

## 2013-02-06 DIAGNOSIS — T383X1A Poisoning by insulin and oral hypoglycemic [antidiabetic] drugs, accidental (unintentional), initial encounter: Secondary | ICD-10-CM | POA: Insufficient documentation

## 2013-02-06 LAB — CBC WITH DIFFERENTIAL/PLATELET
Basophils Absolute: 0 10*3/uL (ref 0.0–0.1)
Basophils Relative: 0 % (ref 0–1)
Eosinophils Absolute: 0 10*3/uL (ref 0.0–0.7)
Eosinophils Relative: 0 % (ref 0–5)
HCT: 35.5 % — ABNORMAL LOW (ref 36.0–46.0)
Hemoglobin: 12.5 g/dL (ref 12.0–15.0)
Lymphocytes Relative: 15 % (ref 12–46)
Lymphs Abs: 2 10*3/uL (ref 0.7–4.0)
MCH: 24.5 pg — ABNORMAL LOW (ref 26.0–34.0)
MCHC: 35.2 g/dL (ref 30.0–36.0)
MCV: 69.6 fL — ABNORMAL LOW (ref 78.0–100.0)
Monocytes Absolute: 0.7 10*3/uL (ref 0.1–1.0)
Monocytes Relative: 5 % (ref 3–12)
Neutro Abs: 10.3 10*3/uL — ABNORMAL HIGH (ref 1.7–7.7)
Neutrophils Relative %: 80 % — ABNORMAL HIGH (ref 43–77)
Platelets: 300 10*3/uL (ref 150–400)
RBC: 5.1 MIL/uL (ref 3.87–5.11)
RDW: 15.2 % (ref 11.5–15.5)
WBC: 13 10*3/uL — ABNORMAL HIGH (ref 4.0–10.5)

## 2013-02-06 LAB — COMPREHENSIVE METABOLIC PANEL
ALT: 19 U/L (ref 0–35)
AST: 15 U/L (ref 0–37)
Albumin: 3.4 g/dL — ABNORMAL LOW (ref 3.5–5.2)
Alkaline Phosphatase: 96 U/L (ref 39–117)
BUN: 13 mg/dL (ref 6–23)
CO2: 25 mEq/L (ref 19–32)
Calcium: 9.6 mg/dL (ref 8.4–10.5)
Chloride: 97 mEq/L (ref 96–112)
Creatinine, Ser: 1.01 mg/dL (ref 0.50–1.10)
GFR calc Af Amer: 74 mL/min — ABNORMAL LOW (ref >90)
GFR calc non Af Amer: 64 mL/min — ABNORMAL LOW (ref >90)
Glucose, Bld: 182 mg/dL — ABNORMAL HIGH (ref 70–99)
Potassium: 3 mEq/L — ABNORMAL LOW (ref 3.5–5.1)
Sodium: 135 mEq/L (ref 135–145)
Total Bilirubin: 0.5 mg/dL (ref 0.3–1.2)
Total Protein: 7.9 g/dL (ref 6.0–8.3)

## 2013-02-06 LAB — URINALYSIS, ROUTINE W REFLEX MICROSCOPIC
Glucose, UA: NEGATIVE mg/dL
Ketones, ur: NEGATIVE mg/dL
Leukocytes, UA: NEGATIVE
Nitrite: NEGATIVE
Protein, ur: >300 mg/dL — AB
Specific Gravity, Urine: 1.027 (ref 1.005–1.030)
Urobilinogen, UA: 1 mg/dL (ref 0.0–1.0)
pH: 5 (ref 5.0–8.0)

## 2013-02-06 LAB — URINE MICROSCOPIC-ADD ON

## 2013-02-06 LAB — TROPONIN I: Troponin I: <0.3 ng/mL (ref <0.30)

## 2013-02-06 LAB — CG4 I-STAT (LACTIC ACID): Lactic Acid, Venous: 2.07 mmol/L (ref 0.5–2.2)

## 2013-02-06 MED ORDER — SODIUM CHLORIDE 0.9 % IV SOLN: 1000.0000 mL | INTRAVENOUS | Status: DC

## 2013-02-06 MED ORDER — DICYCLOMINE HCL 20 MG PO TABS
20.0000 mg | ORAL_TABLET | Freq: Once | ORAL | Status: AC
  Administered 2013-02-06: 20 mg via ORAL
  Filled 2013-02-06: qty 1

## 2013-02-06 MED ORDER — GLYBURIDE 5 MG PO TABS: 5.0000 mg | ORAL_TABLET | Freq: Every day | ORAL | Status: DC

## 2013-02-06 MED ORDER — DICYCLOMINE HCL 20 MG PO TABS: 20.0000 mg | ORAL_TABLET | Freq: Four times a day (QID) | ORAL | Status: DC | PRN

## 2013-02-06 MED ORDER — SODIUM CHLORIDE 0.9 % IV SOLN: 1000.0000 mL | Freq: Once | INTRAVENOUS | Status: AC

## 2013-02-06 MED ORDER — FENTANYL CITRATE 0.05 MG/ML IJ SOLN
50.0000 ug | Freq: Once | INTRAMUSCULAR | Status: AC
  Administered 2013-02-06: 50 ug via INTRAVENOUS
  Filled 2013-02-06: qty 2

## 2013-02-06 MED ORDER — SODIUM CHLORIDE 0.9 % IV SOLN
1000.0000 mL | Freq: Once | INTRAVENOUS | Status: AC
  Administered 2013-02-06: 1000 mL via INTRAVENOUS

## 2013-02-06 NOTE — ED Notes (Signed)
Bed: ZO10 Expected date: 02/06/13 Expected time: 1:38 AM Means of arrival: Ambulance Comments: Syncope/hypotension

## 2013-02-06 NOTE — ED Notes (Signed)
Per EMS pt had syncopal episode witnessed by bystander c/o diarrhea and upper abdominal cramping, pt diaphoretic and dizzy administered 200 ml of fluid placed in trendelenburg due to hypotensive episode. Similar episode when formerly taking Metformin.

## 2013-02-06 NOTE — ED Provider Notes (Signed)
CSN: [id number]     Arrival date & time 02/06/13  0202 History   First MD Initiated Contact with Patient 02/06/13 0158     Chief Complaint  Patient presents with  . Hypotension  . Loss of Consciousness   (Consider location/radiation/quality/duration/timing/severity/associated sxs/prior Treatment) HPI -year-old female presents to emergency department from work via EMS after syncopal episode.  Patient reports she began to have diarrhea.  Tonight around 9 PM.  She's had large multiple episodes of diarrhea.  Just prior to syncope.  She had diffuse abdominal cramping and had a noted large loose bowel movement.  Patient reports she started back on her metformin on Monday.  She reports H. time.  She takes her metformin, she has "horrible diarrhea.".  She denies any nausea or vomiting.  No chest pain.  Just prior to syncopal event, she felt clammy, hot and dizzy.  Patient was reported to be hypertensive.  She received IV fluids with return of normal.  Blood pressure.  At this time.  She complains only of lower abdominal cramping. Past Medical History  Diagnosis Date  . Diabetes mellitus without complication   . Hypertension    Past Surgical History  Procedure Laterality Date  . Appendectomy    . Abdominal hysterectomy     History reviewed. No pertinent family history. History  Substance Use Topics  . Smoking status: Former Smoker -- 0.70 packs/day for 30 years    Types: Cigarettes  . Smokeless tobacco: Not on file  . Alcohol Use: No   OB History   Grav Para Term Preterm Abortions TAB SAB Ect Mult Living                 Review of Systems  All other systems reviewed and are negative.   See History of Present Illness; otherwise all other systems are reviewed and negative  Allergies  Penicillins  Home Medications   Current Outpatient Rx  Name  Route  Sig  Dispense  Refill  . losartan-hydrochlorothiazide (HYZAAR) 50-12.5 MG per tablet   Oral   Take 1 tablet by mouth daily. NEED  VISIT/LABS!   30 tablet   0   . metFORMIN (GLUCOPHAGE) 500 MG tablet   Oral   Take 1 tablet (500 mg total) by mouth 2 (two) times daily with a meal.   60 tablet   0   . ondansetron (ZOFRAN) 4 MG tablet   Oral   Take 1 tablet (4 mg total) by mouth every 8 (eight) hours as needed for nausea.   10 tablet   0   . zolpidem (AMBIEN) 5 MG tablet   Oral   Take 5 mg by mouth at bedtime as needed. Insomnia          BP 127/64  Pulse 90  Temp(Src) 98 F (36.7 C) (Oral)  Resp 18  SpO2 98% Physical Exam  Constitutional: She is oriented to person, place, and time. She appears well-developed and well-nourished.  HENT:  Head: Normocephalic and atraumatic.  Nose: Nose normal.  Mouth/Throat: Oropharynx is clear and moist.  Eyes: Conjunctivae and EOM are normal. Pupils are equal, round, and reactive to light.  Neck: Normal range of motion. Neck supple. No JVD present. No tracheal deviation present. No thyromegaly present.  Cardiovascular: Normal rate, regular rhythm, normal heart sounds and intact distal pulses.  Exam reveals no gallop and no friction rub.   No murmur heard. Pulmonary/Chest: Effort normal and breath sounds normal. No stridor. No respiratory distress. She has no  wheezes. She has no rales. She exhibits no tenderness.  Abdominal: Soft. Bowel sounds are normal. She exhibits no distension and no mass. There is no tenderness. There is no rebound and no guarding.  Increased bowel sounds  Musculoskeletal: Normal range of motion. She exhibits no edema and no tenderness.  Lymphadenopathy:    She has no cervical adenopathy.  Neurological: She is alert and oriented to person, place, and time. She exhibits normal muscle tone. Coordination normal.  Skin: Skin is warm and dry. No rash noted. No erythema. No pallor.  Psychiatric: She has a normal mood and affect. Her behavior is normal. Judgment and thought content normal.    ED Course  Procedures (including critical care time) Labs  Review Labs Reviewed  CBC WITH DIFFERENTIAL - Abnormal; Notable for the following:    WBC 13.0 (*)    HCT 35.5 (*)    MCV 69.6 (*)    MCH 24.5 (*)    Neutrophils Relative % 80 (*)    Neutro Abs 10.3 (*)    All other components within normal limits  COMPREHENSIVE METABOLIC PANEL - Abnormal; Notable for the following:    Potassium 3.0 (*)    Glucose, Bld 182 (*)    Albumin 3.4 (*)    GFR calc non Af Amer 64 (*)    GFR calc Af Amer 74 (*)    All other components within normal limits  URINALYSIS, ROUTINE W REFLEX MICROSCOPIC - Abnormal; Notable for the following:    Color, Urine AMBER (*)    APPearance CLOUDY (*)    Hgb urine dipstick SMALL (*)    Bilirubin Urine SMALL (*)    Protein, ur >300 (*)    All other components within normal limits  URINE MICROSCOPIC-ADD ON - Abnormal; Notable for the following:    Squamous Epithelial / LPF FEW (*)    Bacteria, UA FEW (*)    Casts GRANULAR CAST (*)    All other components within normal limits  URINE CULTURE  TROPONIN I  CG4 I-STAT (LACTIC ACID)   Imaging Review No results found.   Date: 02/06/2013  Rate: 83  Rhythm: normal sinus rhythm  QRS Axis: normal  Intervals: normal  ST/T Wave abnormalities: normal  Conduction Disutrbances:none  Narrative Interpretation:   Old EKG Reviewed: unchanged   MDM   1. Vasovagal syncope   2. Metformin adverse reaction, initial encounter   3. Diarrhea    52 year old female with diarrhea, most likely due to to metformin, and syncopal episode tonight, which I suspect is due to vasovagal causes.  Labwork shows leukocytosis with low potassium.  Her orthostatics here, showing a slight increase in her heart rate.  She is receiving IV fluids.  We'll give her Bentyl for abdominal cramping.  We'll plan to DC her metformin, have her followup with her primary care Dr. for new alternative to her metformin  Olivia Mackie, MD 02/06/13 [phone]

## 2013-02-07 LAB — URINE CULTURE
Colony Count: NO GROWTH
Culture: NO GROWTH

## 2013-02-16 ENCOUNTER — Ambulatory Visit: Payer: BC Managed Care – PPO | Admitting: Emergency Medicine

## 2013-03-02 ENCOUNTER — Ambulatory Visit: Payer: BC Managed Care – PPO | Admitting: Emergency Medicine

## 2013-06-04 ENCOUNTER — Ambulatory Visit (INDEPENDENT_AMBULATORY_CARE_PROVIDER_SITE_OTHER): Payer: BC Managed Care – PPO | Admitting: Emergency Medicine

## 2013-06-04 ENCOUNTER — Ambulatory Visit: Payer: BC Managed Care – PPO

## 2013-06-04 VITALS — BP 118/72 | HR 80 | Temp 98.2°F | Resp 18 | Ht 65.0 in | Wt 218.0 lb

## 2013-06-04 DIAGNOSIS — M549 Dorsalgia, unspecified: Secondary | ICD-10-CM

## 2013-06-04 DIAGNOSIS — E119 Type 2 diabetes mellitus without complications: Secondary | ICD-10-CM

## 2013-06-04 DIAGNOSIS — I10 Essential (primary) hypertension: Secondary | ICD-10-CM

## 2013-06-04 DIAGNOSIS — W19XXXA Unspecified fall, initial encounter: Secondary | ICD-10-CM

## 2013-06-04 DIAGNOSIS — M25559 Pain in unspecified hip: Secondary | ICD-10-CM

## 2013-06-04 LAB — POCT CBC
Granulocyte percent: 62.5 %G (ref 37–80)
HCT, POC: 38.5 % (ref 37.7–47.9)
Hemoglobin: 12.4 g/dL (ref 12.2–16.2)
Lymph, poc: 2.3 (ref 0.6–3.4)
MCH, POC: 24.9 pg — AB (ref 27–31.2)
MCHC: 32.2 g/dL (ref 31.8–35.4)
MCV: 77.3 fL — AB (ref 80–97)
MID (cbc): 0.6 (ref 0–0.9)
MPV: 9.6 fL (ref 0–99.8)
POC Granulocyte: 4.7 (ref 2–6.9)
POC LYMPH PERCENT: 30.2 %L (ref 10–50)
POC MID %: 7.3 %M (ref 0–12)
Platelet Count, POC: 296 10*3/uL (ref 142–424)
RBC: 4.98 M/uL (ref 4.04–5.48)
RDW, POC: 16.3 %
WBC: 7.6 10*3/uL (ref 4.6–10.2)

## 2013-06-04 LAB — LIPID PANEL
Cholesterol: 202 mg/dL — ABNORMAL HIGH (ref 0–200)
HDL: 52 mg/dL (ref >39)
LDL Cholesterol: 135 mg/dL — ABNORMAL HIGH (ref 0–99)
Total CHOL/HDL Ratio: 3.9 Ratio
Triglycerides: 73 mg/dL (ref <150)
VLDL: 15 mg/dL (ref 0–40)

## 2013-06-04 LAB — COMPREHENSIVE METABOLIC PANEL
ALT: 16 U/L (ref 0–35)
AST: 16 U/L (ref 0–37)
Albumin: 3.8 g/dL (ref 3.5–5.2)
Alkaline Phosphatase: 103 U/L (ref 39–117)
BUN: 12 mg/dL (ref 6–23)
CO2: 31 mEq/L (ref 19–32)
Calcium: 9.4 mg/dL (ref 8.4–10.5)
Chloride: 104 mEq/L (ref 96–112)
Creat: 0.8 mg/dL (ref 0.50–1.10)
Glucose, Bld: 119 mg/dL — ABNORMAL HIGH (ref 70–99)
Potassium: 4.8 mEq/L (ref 3.5–5.3)
Sodium: 142 mEq/L (ref 135–145)
Total Bilirubin: 0.3 mg/dL (ref 0.3–1.2)
Total Protein: 7.9 g/dL (ref 6.0–8.3)

## 2013-06-04 LAB — POCT GLYCOSYLATED HEMOGLOBIN (HGB A1C): Hemoglobin A1C: 6.7

## 2013-06-04 LAB — GLUCOSE, POCT (MANUAL RESULT ENTRY): POC Glucose: 104 mg/dl — AB (ref 70–99)

## 2013-06-04 MED ORDER — TRAMADOL HCL 50 MG PO TABS: 50.0000 mg | ORAL_TABLET | Freq: Three times a day (TID) | ORAL | Status: DC | PRN

## 2013-06-04 MED ORDER — ZOLPIDEM TARTRATE 5 MG PO TABS: 5.0000 mg | ORAL_TABLET | Freq: Every evening | ORAL | Status: DC | PRN

## 2013-06-04 MED ORDER — GLYBURIDE 5 MG PO TABS: 5.0000 mg | ORAL_TABLET | Freq: Every day | ORAL | Status: DC

## 2013-06-04 MED ORDER — LOSARTAN POTASSIUM-HCTZ 50-12.5 MG PO TABS: ORAL_TABLET | ORAL | Status: DC

## 2013-06-04 NOTE — Progress Notes (Signed)
Subjective:    Patient ID: Karen [name], female    DOB: 05/11/1961, 53 y.o.   MRN: [id number]  HPI patient was in her usual state of health until yesterday when trying to go down the steps she slipped. She grabbed the handrail and then twisted her back. She now is having significant pain in her back and into the right hip to she is very sore in the right SI joint area she denies paresthesias in the lower leg     Review of Systems Patient has a history of diabetes. She recently went to the emergency room about 4 months ago and her medications were changed at that time. She was changed from Glucophage to Viola. She has not had close followup on her diabetes. She was last seen here for diabetes check in the fall of 2013    Objective:   Physical Exam HEENT exam is unremarkable. Her neck is supple. Her chest is clear to auscultation and percussion. Heart is a regular rate without murmurs rubs or gallops. The abdomen is without tenderness or masses. There is tenderness in the right SI joint. Straight leg raising is positive on the right at about 45. Patient needs assistance getting up and down from a sitting to a lying position. Examination of feet revealed no ulcerations. Pulses are 1+ and symmetrical.  [facility] reading (PRIMARY) by  Dr. Everlene Farrier LS spine films showed degenerative arthritic changes at the facet L4-5. There are no destructive lesions seen right hip appears normal.  Results for orders placed during the hospital encounter of 02/06/13  URINE CULTURE      Result Value Range   Specimen Description URINE, CLEAN CATCH     Special Requests NONE     Culture  Setup Time       Value: 02/06/2013 09:55     Performed at [facility]       Value: NO GROWTH     Performed at [facility]   Culture       Value: NO GROWTH     Performed at [facility]   Report Status 02/07/2013 FINAL    CBC WITH DIFFERENTIAL      Result Value Range   WBC 13.0 (*) 4.0 -  10.5 K/uL   RBC 5.10  3.87 - 5.11 MIL/uL   Hemoglobin 12.5  12.0 - 15.0 g/dL   HCT 35.5 (*) 36.0 - 46.0 %   MCV 69.6 (*) 78.0 - 100.0 fL   MCH 24.5 (*) 26.0 - 34.0 pg   MCHC 35.2  30.0 - 36.0 g/dL   RDW 15.2  11.5 - 15.5 %   Platelets 300  150 - 400 K/uL   Neutrophils Relative % 80 (*) 43 - 77 %   Lymphocytes Relative 15  12 - 46 %   Monocytes Relative 5  3 - 12 %   Eosinophils Relative 0  0 - 5 %   Basophils Relative 0  0 - 1 %   Neutro Abs 10.3 (*) 1.7 - 7.7 K/uL   Lymphs Abs 2.0  0.7 - 4.0 K/uL   Monocytes Absolute 0.7  0.1 - 1.0 K/uL   Eosinophils Absolute 0.0  0.0 - 0.7 K/uL   Basophils Absolute 0.0  0.0 - 0.1 K/uL   RBC Morphology TARGET CELLS    COMPREHENSIVE METABOLIC PANEL      Result Value Range   Sodium 135  135 - 145 mEq/L   Potassium 3.0 (*)  3.5 - 5.1 mEq/L   Chloride 97  96 - 112 mEq/L   CO2 25  19 - 32 mEq/L   Glucose, Bld 182 (*) 70 - 99 mg/dL   BUN 13  6 - 23 mg/dL   Creatinine, Ser 1.01  0.50 - 1.10 mg/dL   Calcium 9.6  8.4 - 10.5 mg/dL   Total Protein 7.9  6.0 - 8.3 g/dL   Albumin 3.4 (*) 3.5 - 5.2 g/dL   AST 15  0 - 37 U/L   ALT 19  0 - 35 U/L   Alkaline Phosphatase 96  39 - 117 U/L   Total Bilirubin 0.5  0.3 - 1.2 mg/dL   GFR calc non Af Amer 64 (*) >90 mL/min   GFR calc Af Amer 74 (*) >90 mL/min  URINALYSIS, ROUTINE W REFLEX MICROSCOPIC      Result Value Range   Color, Urine AMBER (*) YELLOW   APPearance CLOUDY (*) CLEAR   Specific Gravity, Urine 1.027  1.005 - 1.030   pH 5.0  5.0 - 8.0   Glucose, UA NEGATIVE  NEGATIVE mg/dL   Hgb urine dipstick SMALL (*) NEGATIVE   Bilirubin Urine SMALL (*) NEGATIVE   Ketones, ur NEGATIVE  NEGATIVE mg/dL   Protein, ur >300 (*) NEGATIVE mg/dL   Urobilinogen, UA 1.0  0.0 - 1.0 mg/dL   Nitrite NEGATIVE  NEGATIVE   Leukocytes, UA NEGATIVE  NEGATIVE  TROPONIN I      Result Value Range   Troponin I <0.30  <0.30 ng/mL  URINE MICROSCOPIC-ADD ON      Result Value Range   Squamous Epithelial / LPF FEW (*) RARE    WBC, UA 3-6  <3 WBC/hpf   RBC / HPF 0-2  <3 RBC/hpf   Bacteria, UA FEW (*) RARE   Casts GRANULAR CAST (*) NEGATIVE   Urine-Other MUCOUS PRESENT    CG4 I-STAT (LACTIC ACID)      Result Value Range   Lactic Acid, Venous 2.07  0.5 - 2.2 mmol/L   Results for orders placed in visit on 06/04/13  POCT CBC      Result Value Range   WBC 7.6  4.6 - 10.2 K/uL   Lymph, poc 2.3  0.6 - 3.4   POC LYMPH PERCENT 30.2  10 - 50 %L   MID (cbc) 0.6  0 - 0.9   POC MID % 7.3  0 - 12 %M   POC Granulocyte 4.7  2 - 6.9   Granulocyte percent 62.5  37 - 80 %G   RBC 4.98  4.04 - 5.48 M/uL   Hemoglobin 12.4  12.2 - 16.2 g/dL   HCT, POC 38.5  37.7 - 47.9 %   MCV 77.3 (*) 80 - 97 fL   MCH, POC 24.9 (*) 27 - 31.2 pg   MCHC 32.2  31.8 - 35.4 g/dL   RDW, POC 16.3     Platelet Count, POC 296  142 - 424 K/uL   MPV 9.6  0 - 99.8 fL  GLUCOSE, POCT (MANUAL RESULT ENTRY)      Result Value Range   POC Glucose 104 (*) 70 - 99 mg/dl  POCT GLYCOSYLATED HEMOGLOBIN (HGB A1C)      Result Value Range   Hemoglobin A1C 6.7         Assessment & Plan:  Blood pressure medication and diabetes medicines are refilled. she will be on Ultram for pain recheck on  Sunday if not better

## 2013-06-04 NOTE — Patient Instructions (Signed)
Type 2 Diabetes Mellitus, Adult Type 2 diabetes mellitus, often simply referred to as type 2 diabetes, is a long-lasting (chronic) disease. In type 2 diabetes, the pancreas does not make enough insulin (a hormone), the cells are less responsive to the insulin that is made (insulin resistance), or both. Normally, insulin moves sugars from food into the tissue cells. The tissue cells use the sugars for energy. The lack of insulin or the lack of normal response to insulin causes excess sugars to build up in the blood instead of going into the tissue cells. As a result, high blood sugar (hyperglycemia) develops. The effect of high sugar (glucose) levels can cause many complications. Type 2 diabetes was also previously called adult-onset diabetes but it can occur at any age.  RISK FACTORS  A person is predisposed to developing type 2 diabetes if someone in the family has the disease and also has one or more of the following primary risk factors:  Overweight.  An inactive lifestyle.  A history of consistently eating high-calorie foods. Maintaining a normal weight and regular physical activity can reduce the chance of developing type 2 diabetes. SYMPTOMS  A person with type 2 diabetes may not show symptoms initially. The symptoms of type 2 diabetes appear slowly. The symptoms include:  Increased thirst (polydipsia).  Increased urination (polyuria).  Increased urination during the night (nocturia).  Weight loss. This weight loss may be rapid.  Frequent, recurring infections.  Tiredness (fatigue).  Weakness.  Vision changes, such as blurred vision.  Fruity smell to your breath.  Abdominal pain.  Nausea or vomiting.  Cuts or bruises which are slow to heal.  Tingling or numbness in the hands or feet. DIAGNOSIS Type 2 diabetes is frequently not diagnosed until complications of diabetes are present. Type 2 diabetes is diagnosed when symptoms or complications are present and when blood  glucose levels are increased. Your blood glucose level may be checked by one or more of the following blood tests:  A fasting blood glucose test. You will not be allowed to eat for at least 8 hours before a blood sample is taken.  A random blood glucose test. Your blood glucose is checked at any time of the day regardless of when you ate.  A hemoglobin A1c blood glucose test. A hemoglobin A1c test provides information about blood glucose control over the previous 3 months.  An oral glucose tolerance test (OGTT). Your blood glucose is measured after you have not eaten (fasted) for 2 hours and then after you drink a glucose-containing beverage. TREATMENT   You may need to take insulin or diabetes medicine daily to keep blood glucose levels in the desired range.  You will need to match insulin dosing with exercise and healthy food choices. The treatment goal is to maintain the before meal blood sugar (preprandial glucose) level at 70 130 mg/dL. HOME CARE INSTRUCTIONS   Have your hemoglobin A1c level checked twice a year.  Perform daily blood glucose monitoring as directed by your caregiver.  Monitor urine ketones when you are ill and as directed by your caregiver.  Take your diabetes medicine or insulin as directed by your caregiver to maintain your blood glucose levels in the desired range.  Never run out of diabetes medicine or insulin. It is needed every day.  Adjust insulin based on your intake of carbohydrates. Carbohydrates can raise blood glucose levels but need to be included in your diet. Carbohydrates provide vitamins, minerals, and fiber which are an essential part of   a healthy diet. Carbohydrates are found in fruits, vegetables, whole grains, dairy products, legumes, and foods containing added sugars.    Eat healthy foods. Alternate 3 meals with 3 snacks.  Lose weight if overweight.  Carry a medical alert card or wear your medical alert jewelry.  Carry a 15 gram  carbohydrate snack with you at all times to treat low blood glucose (hypoglycemia). Some examples of 15 gram carbohydrate snacks include:  Glucose tablets, 3 or 4   Glucose gel, 15 gram tube  Raisins, 2 tablespoons (24 grams)  Jelly beans, 6  Animal crackers, 8  Regular pop, 4 ounces (120 mL)  Gummy treats, 9  Recognize hypoglycemia. Hypoglycemia occurs with blood glucose levels of 70 mg/dL and below. The risk for hypoglycemia increases when fasting or skipping meals, during or after intense exercise, and during sleep. Hypoglycemia symptoms can include:  Tremors or shakes.  Decreased ability to concentrate.  Sweating.  Increased heart rate.  Headache.  Dry mouth.  Hunger.  Irritability.  Anxiety.  Restless sleep.  Altered speech or coordination.  Confusion.  Treat hypoglycemia promptly. If you are alert and able to safely swallow, follow the 15:15 rule:  Take 15 20 grams of rapid-acting glucose or carbohydrate. Rapid-acting options include glucose gel, glucose tablets, or 4 ounces (120 mL) of fruit juice, regular soda, or low fat milk.  Check your blood glucose level 15 minutes after taking the glucose.  Take 15 20 grams more of glucose if the repeat blood glucose level is still 70 mg/dL or below.  Eat a meal or snack within 1 hour once blood glucose levels return to normal.    Be alert to polyuria and polydipsia which are early signs of hyperglycemia. An early awareness of hyperglycemia allows for prompt treatment. Treat hyperglycemia as directed by your caregiver.  Engage in at least 150 minutes of moderate-intensity physical activity a week, spread over at least 3 days of the week or as directed by your caregiver. In addition, you should engage in resistance exercise at least 2 times a week or as directed by your caregiver.  Adjust your medicine and food intake as needed if you start a new exercise or sport.  Follow your sick day plan at any time you  are unable to eat or drink as usual.  Avoid tobacco use.  Limit alcohol intake to no more than 1 drink per day for nonpregnant women and 2 drinks per day for men. You should drink alcohol only when you are also eating food. Talk with your caregiver whether alcohol is safe for you. Tell your caregiver if you drink alcohol several times a week.  Follow up with your caregiver regularly.  Schedule an eye exam soon after the diagnosis of type 2 diabetes and then annually.  Perform daily skin and foot care. Examine your skin and feet daily for cuts, bruises, redness, nail problems, bleeding, blisters, or sores. A foot exam by a caregiver should be done annually.  Brush your teeth and gums at least twice a day and floss at least once a day. Follow up with your dentist regularly.  Share your diabetes management plan with your workplace or school.  Stay up-to-date with immunizations.  Learn to manage stress.  Obtain ongoing diabetes education and support as needed.  Participate in, or seek rehabilitation as needed to maintain or improve independence and quality of life. Request a physical or occupational therapy referral if you are having foot or hand numbness or difficulties with grooming,   dressing, eating, or physical activity. SEEK MEDICAL CARE IF:   You are unable to eat food or drink fluids for more than 6 hours.  You have nausea and vomiting for more than 6 hours.  Your blood glucose level is over 240 mg/dL.  There is a change in mental status.  You develop an additional serious illness.  You have diarrhea for more than 6 hours.  You have been sick or have had a fever for a couple of days and are not getting better.  You have pain during any physical activity.  SEEK IMMEDIATE MEDICAL CARE IF:  You have difficulty breathing.  You have moderate to large ketone levels. MAKE SURE YOU:  Understand these instructions.  Will watch your condition.  Will get help right away if  you are not doing well or get worse. Document Released: 05/07/2005 Document Revised: 01/30/2012 Document Reviewed: 12/04/2011 [employer] Patient Information 2014 [employer].  

## 2013-06-05 ENCOUNTER — Telehealth: Payer: Self-pay

## 2013-06-05 NOTE — Telephone Encounter (Signed)
Pt states that she is unable to take the tramadol prescribed because it upsets her stomach. Pt would like to know if she could take something else. Best# [phone]

## 2013-06-07 NOTE — Telephone Encounter (Signed)
Pt called stated she has called several times and no call back in regards to her being unable to take  the tramadol given. Patient needs something to be called in for pain. [facility] [address]. Call back  # for patient [phone]

## 2013-06-08 ENCOUNTER — Other Ambulatory Visit: Payer: Self-pay | Admitting: Emergency Medicine

## 2013-06-08 DIAGNOSIS — M549 Dorsalgia, unspecified: Secondary | ICD-10-CM

## 2013-06-08 MED ORDER — HYDROCODONE-ACETAMINOPHEN 5-325 MG PO TABS: ORAL_TABLET | ORAL | Status: DC

## 2013-06-08 NOTE — Telephone Encounter (Signed)
Called pt to let her know Im sending the message to Dr Everlene Farrier.

## 2013-06-08 NOTE — Telephone Encounter (Signed)
I have printed a prescription for pain medication for patient to pick up. Please call and let her know it is available for her at the front desk.

## 2013-06-08 NOTE — Telephone Encounter (Signed)
Patient informed Rx is ready for pick up

## 2013-06-16 MED ORDER — ATORVASTATIN CALCIUM 20 MG PO TABS: 20.0000 mg | ORAL_TABLET | Freq: Every day | ORAL | Status: DC

## 2013-06-16 NOTE — Addendum Note (Signed)
Addended by: Jethro Bolus A on: 06/16/2013 10:49 AM   Modules accepted: Orders

## 2013-10-02 ENCOUNTER — Telehealth: Payer: Self-pay

## 2013-10-02 ENCOUNTER — Other Ambulatory Visit: Payer: Self-pay

## 2013-10-02 NOTE — Telephone Encounter (Signed)
Pt is experiencing breast pain and was told she needed a referral for a mammogram.  Can we please set her up a referral?    [phone]

## 2013-10-02 NOTE — Telephone Encounter (Signed)
Spoke to pt, she is aware she will need to be seen in office for a referral since she is having a problem. She will come to the walk in clinic next Thursday due to lack of appointments available at 104 for this date.

## 2013-12-03 ENCOUNTER — Ambulatory Visit (INDEPENDENT_AMBULATORY_CARE_PROVIDER_SITE_OTHER): Payer: BC Managed Care – PPO

## 2013-12-03 ENCOUNTER — Ambulatory Visit (INDEPENDENT_AMBULATORY_CARE_PROVIDER_SITE_OTHER): Payer: BC Managed Care – PPO | Admitting: Family Medicine

## 2013-12-03 VITALS — BP 128/80 | HR 94 | Temp 97.9°F | Resp 18 | Ht 64.0 in | Wt 224.0 lb

## 2013-12-03 DIAGNOSIS — R079 Chest pain, unspecified: Secondary | ICD-10-CM

## 2013-12-03 DIAGNOSIS — K30 Functional dyspepsia: Secondary | ICD-10-CM

## 2013-12-03 DIAGNOSIS — N644 Mastodynia: Secondary | ICD-10-CM

## 2013-12-03 DIAGNOSIS — R1013 Epigastric pain: Secondary | ICD-10-CM

## 2013-12-03 DIAGNOSIS — K3189 Other diseases of stomach and duodenum: Secondary | ICD-10-CM

## 2013-12-03 DIAGNOSIS — F411 Generalized anxiety disorder: Secondary | ICD-10-CM

## 2013-12-03 NOTE — Progress Notes (Signed)
Subjective: 53 year old lady with history of having left breast pain for the past couple of days. Knows of no injury. Has not had pains like this before. The pain is a pressure-like pain deep to her left breast. It's sometimes tender to touch. She gets some pain in her left neck and left shoulder. No shortness of breath. She did vomit one time yesterday. It has not hurt her much in the nighttime. When she has her left side up and lays on the right side it seems to hurt more. When she leans forward it hurts more. She last had a normal mammogram 3 years ago, none since. She has a history of an aunt having breast cancer. Her mother had cancer of her wound. The patient had a hysterectomy when she was 21 due to PID.  She has history of diabetes and hypertension and is on medications for these.  Has been worrying about this a lot  Has had some indigestion.  Smoker  Objective: Heavy set middle-aged lady, anxious. Her neck supple and nontender. Shoulder and back and neck nontender. Chest clear. Heart regular without murmurs gallops or arrhythmias. Breasts are symmetrical with no dimpling or erythema. No axillary nodes. The breasts have some deep nodularity in both. Deep palpation of the left breast when she was laying down seemed to cause a great deal of pain and tenderness however examined her in the seated position did not seem to be very tender.  Assessment: Left breast pain, etiology undetermined  Plan: Chest x-ray, EKG [facility] reading (PRIMARY) by  Dr. Linna Darner Normal chest  Normal EKG  Assessment: Atypical chest pain, probably from chest wall Breasts pain  Plan: Mammogram, diagnostic Naproxen 440 ttwice daily. History of aspirin irritating her stomach, but has tolerated Aleve in the past. Recheck her in a week Omeprazole for the indigestion.  Marland Kitchen

## 2013-12-03 NOTE — Patient Instructions (Addendum)
Take Aleve 2 tablets take with food for breast discomfort  Mammogram is being scheduled for you. If you do not hear from that appointment in next few days please call back  Return or go to the emergency room at anytime if more severe pain or accompanied with shortness of breath, vomiting, breaking out in a sweat, arm and neck pain.  Take over-the-counter Prilosec (omeprazole) 20 mg one daily for the indigestion that you have.   Stop smoking

## 2013-12-04 ENCOUNTER — Other Ambulatory Visit: Payer: Self-pay | Admitting: Family Medicine

## 2013-12-04 DIAGNOSIS — N644 Mastodynia: Secondary | ICD-10-CM

## 2013-12-09 ENCOUNTER — Other Ambulatory Visit: Payer: Self-pay

## 2013-12-09 ENCOUNTER — Other Ambulatory Visit: Payer: Self-pay | Admitting: Family Medicine

## 2013-12-09 DIAGNOSIS — N644 Mastodynia: Secondary | ICD-10-CM

## 2013-12-10 ENCOUNTER — Ambulatory Visit
Admission: RE | Admit: 2013-12-10 | Discharge: 2013-12-10 | Disposition: A | Discharge status: Home / Self Care | Payer: BC Managed Care – PPO | Source: Ambulatory Visit | Attending: Family Medicine | Admitting: Family Medicine

## 2013-12-10 ENCOUNTER — Telehealth: Payer: Self-pay

## 2013-12-10 DIAGNOSIS — N644 Mastodynia: Secondary | ICD-10-CM

## 2013-12-10 DIAGNOSIS — M5489 Other dorsalgia: Secondary | ICD-10-CM

## 2013-12-10 NOTE — Telephone Encounter (Signed)
Hopper,   Patient has a mammogram at 11 am.  She states the aleve is not strong enough for the pain in her breast.  Says Dr. Linna Darner told her to call if she needed something stronger.   [facility] [address]  [phone]

## 2013-12-11 ENCOUNTER — Other Ambulatory Visit: Payer: Self-pay | Admitting: Family Medicine

## 2013-12-11 DIAGNOSIS — N644 Mastodynia: Secondary | ICD-10-CM

## 2013-12-11 MED ORDER — HYDROCODONE-ACETAMINOPHEN 5-325 MG PO TABS: ORAL_TABLET | ORAL | Status: DC

## 2013-12-11 MED ORDER — TRAMADOL HCL 50 MG PO TABS: 50.0000 mg | ORAL_TABLET | Freq: Three times a day (TID) | ORAL | Status: DC | PRN

## 2013-12-11 NOTE — Telephone Encounter (Signed)
Discussed and agreed on medicine for pain, hydrocodone #10

## 2013-12-11 NOTE — Telephone Encounter (Signed)
Pt called today saying that she is allergic to tramadol; it makes her sick to her stomach. Is there an alternative??

## 2013-12-11 NOTE — Telephone Encounter (Signed)
Per Dr. Linna Darner, ok to give #10 norco. Printed for him to sign and up front for p/u

## 2013-12-11 NOTE — Telephone Encounter (Signed)
Call Tramadol prescription for pain med is at office and will need to pick up.   Return if inadequate relief.

## 2014-03-06 ENCOUNTER — Emergency Department (HOSPITAL_COMMUNITY)
Admission: EM | Admit: 2014-03-06 | Discharge: 2014-03-06 | Disposition: A | Discharge status: Home / Self Care | Payer: BC Managed Care – PPO | Attending: Emergency Medicine | Admitting: Emergency Medicine

## 2014-03-06 ENCOUNTER — Encounter (HOSPITAL_COMMUNITY): Payer: Self-pay | Admitting: Emergency Medicine

## 2014-03-06 ENCOUNTER — Emergency Department (HOSPITAL_COMMUNITY): Payer: BC Managed Care – PPO

## 2014-03-06 DIAGNOSIS — Z880 Allergy status to penicillin: Secondary | ICD-10-CM | POA: Diagnosis not present

## 2014-03-06 DIAGNOSIS — I10 Essential (primary) hypertension: Secondary | ICD-10-CM | POA: Insufficient documentation

## 2014-03-06 DIAGNOSIS — Z9049 Acquired absence of other specified parts of digestive tract: Secondary | ICD-10-CM | POA: Insufficient documentation

## 2014-03-06 DIAGNOSIS — Z87891 Personal history of nicotine dependence: Secondary | ICD-10-CM | POA: Insufficient documentation

## 2014-03-06 DIAGNOSIS — R109 Unspecified abdominal pain: Secondary | ICD-10-CM | POA: Diagnosis not present

## 2014-03-06 DIAGNOSIS — E119 Type 2 diabetes mellitus without complications: Secondary | ICD-10-CM | POA: Insufficient documentation

## 2014-03-06 DIAGNOSIS — Z79899 Other long term (current) drug therapy: Secondary | ICD-10-CM | POA: Diagnosis not present

## 2014-03-06 DIAGNOSIS — Z9089 Acquired absence of other organs: Secondary | ICD-10-CM | POA: Diagnosis not present

## 2014-03-06 DIAGNOSIS — Z791 Long term (current) use of non-steroidal anti-inflammatories (NSAID): Secondary | ICD-10-CM | POA: Diagnosis not present

## 2014-03-06 DIAGNOSIS — N201 Calculus of ureter: Secondary | ICD-10-CM | POA: Diagnosis not present

## 2014-03-06 DIAGNOSIS — R10A1 Flank pain, right side: Secondary | ICD-10-CM

## 2014-03-06 LAB — CBC
HCT: 35.7 % — ABNORMAL LOW (ref 36.0–46.0)
Hemoglobin: 12.4 g/dL (ref 12.0–15.0)
MCH: 24.2 pg — ABNORMAL LOW (ref 26.0–34.0)
MCHC: 34.7 g/dL (ref 30.0–36.0)
MCV: 69.6 fL — ABNORMAL LOW (ref 78.0–100.0)
Platelets: 298 10*3/uL (ref 150–400)
RBC: 5.13 MIL/uL — ABNORMAL HIGH (ref 3.87–5.11)
RDW: 15.5 % (ref 11.5–15.5)
WBC: 15.5 10*3/uL — ABNORMAL HIGH (ref 4.0–10.5)

## 2014-03-06 LAB — URINE MICROSCOPIC-ADD ON

## 2014-03-06 LAB — URINALYSIS, ROUTINE W REFLEX MICROSCOPIC
Bilirubin Urine: NEGATIVE
Glucose, UA: NEGATIVE mg/dL
Ketones, ur: NEGATIVE mg/dL
Nitrite: NEGATIVE
Protein, ur: 30 mg/dL — AB
Specific Gravity, Urine: 1.024 (ref 1.005–1.030)
Urobilinogen, UA: 0.2 mg/dL (ref 0.0–1.0)
pH: 5 (ref 5.0–8.0)

## 2014-03-06 LAB — BASIC METABOLIC PANEL
Anion gap: 13 (ref 5–15)
BUN: 11 mg/dL (ref 6–23)
CO2: 27 mEq/L (ref 19–32)
Calcium: 9.6 mg/dL (ref 8.4–10.5)
Chloride: 101 mEq/L (ref 96–112)
Creatinine, Ser: 0.84 mg/dL (ref 0.50–1.10)
GFR calc Af Amer: >90 mL/min (ref >90)
GFR calc non Af Amer: 79 mL/min — ABNORMAL LOW (ref >90)
Glucose, Bld: 131 mg/dL — ABNORMAL HIGH (ref 70–99)
Potassium: 4.1 mEq/L (ref 3.7–5.3)
Sodium: 141 mEq/L (ref 137–147)

## 2014-03-06 MED ORDER — ONDANSETRON HCL 4 MG/2ML IJ SOLN
4.0000 mg | Freq: Once | INTRAMUSCULAR | Status: AC
  Administered 2014-03-06: 4 mg via INTRAVENOUS
  Filled 2014-03-06: qty 2

## 2014-03-06 MED ORDER — PROMETHAZINE HCL 25 MG PO TABS: 25.0000 mg | ORAL_TABLET | Freq: Four times a day (QID) | ORAL | Status: DC | PRN

## 2014-03-06 MED ORDER — HYDROCODONE-ACETAMINOPHEN 5-325 MG PO TABS: 1.0000 | ORAL_TABLET | Freq: Four times a day (QID) | ORAL | Status: DC | PRN

## 2014-03-06 MED ORDER — KETOROLAC TROMETHAMINE 30 MG/ML IJ SOLN
30.0000 mg | Freq: Once | INTRAMUSCULAR | Status: AC
  Administered 2014-03-06: 30 mg via INTRAVENOUS
  Filled 2014-03-06: qty 1

## 2014-03-06 MED ORDER — SODIUM CHLORIDE 0.9 % IV BOLUS (SEPSIS)
1000.0000 mL | Freq: Once | INTRAVENOUS | Status: AC
  Administered 2014-03-06: 1000 mL via INTRAVENOUS

## 2014-03-06 MED ORDER — TAMSULOSIN HCL 0.4 MG PO CAPS: 0.4000 mg | ORAL_CAPSULE | Freq: Every day | ORAL | Status: DC

## 2014-03-06 MED ORDER — MORPHINE SULFATE 4 MG/ML IJ SOLN
4.0000 mg | Freq: Once | INTRAMUSCULAR | Status: AC
  Administered 2014-03-06: 4 mg via INTRAVENOUS
  Filled 2014-03-06: qty 1

## 2014-03-06 NOTE — ED Notes (Signed)
Per pt, states blood in urine, and right flank pain since this am-some burning with urination

## 2014-03-06 NOTE — ED Provider Notes (Signed)
CSN: [id number]     Arrival date & time 03/06/14  1607 History   First MD Initiated Contact with Patient 03/06/14 1637     Chief Complaint  Patient presents with  . Hematuria  . Flank Pain  . Diarrhea     (Consider location/radiation/quality/duration/timing/severity/associated sxs/prior Treatment) Patient is a 53 y.o. female presenting with hematuria, flank pain, and diarrhea. The history is provided by the patient.  Hematuria This is a new problem. The problem occurs constantly. The problem has not changed since onset.Pertinent negatives include no chest pain, no abdominal pain, no headaches and no shortness of breath. Associated symptoms comments: Intermittent R flank pain. Nothing aggravates the symptoms. Nothing relieves the symptoms. She has tried nothing for the symptoms. The treatment provided no relief.  Flank Pain This is a new problem. The current episode started 12 to 24 hours ago. The problem occurs constantly. The problem has not changed since onset.Pertinent negatives include no chest pain, no abdominal pain, no headaches and no shortness of breath. Nothing aggravates the symptoms. Nothing relieves the symptoms. Treatments tried: Aleve. The treatment provided no relief.  Diarrhea Associated symptoms: no abdominal pain, no arthralgias, no chills, no diaphoresis, no fever, no headaches and no vomiting     Past Medical History  Diagnosis Date  . Diabetes mellitus without complication   . Hypertension    Past Surgical History  Procedure Laterality Date  . Appendectomy    . Abdominal hysterectomy     No family history on file. History  Substance Use Topics  . Smoking status: Former Smoker -- 0.70 packs/day for 30 years    Types: Cigarettes  . Smokeless tobacco: Not on file  . Alcohol Use: No   OB History   Grav Para Term Preterm Abortions TAB SAB Ect Mult Living                 Review of Systems  Constitutional: Negative for fever, chills, diaphoresis, activity  change, appetite change and fatigue.  HENT: Negative for congestion, facial swelling, rhinorrhea and sore throat.   Eyes: Negative for photophobia and discharge.  Respiratory: Negative for cough, chest tightness and shortness of breath.   Cardiovascular: Negative for chest pain, palpitations and leg swelling.  Gastrointestinal: Positive for diarrhea. Negative for nausea, vomiting and abdominal pain.  Endocrine: Negative for polydipsia and polyuria.  Genitourinary: Positive for hematuria and flank pain. Negative for dysuria, frequency, difficulty urinating and pelvic pain.  Musculoskeletal: Negative for arthralgias, back pain, neck pain and neck stiffness.  Skin: Negative for color change and wound.  Allergic/Immunologic: Negative for immunocompromised state.  Neurological: Negative for facial asymmetry, weakness, numbness and headaches.  Hematological: Does not bruise/bleed easily.  Psychiatric/Behavioral: Negative for confusion and agitation.      Allergies  Orange fruit and Penicillins  Home Medications   Prior to Admission medications   Medication Sig Start Date End Date Taking? Authorizing Provider  alum & mag hydroxide-simeth (MAALOX/MYLANTA) 200-200-20 MG/5ML suspension Take 30 mLs by mouth every 6 (six) hours as needed for indigestion or heartburn (gas).   Yes Historical Provider, MD  glyBURIDE (DIABETA) 5 MG tablet Take 1 tablet (5 mg total) by mouth daily with breakfast. 06/04/13  Yes Darlyne Russian, MD  losartan-hydrochlorothiazide (HYZAAR) 50-12.5 MG per tablet Take 1 tablet by mouth daily.   Yes Historical Provider, MD  naproxen sodium (ANAPROX) 220 MG tablet Take 220 mg by mouth daily as needed (pain).   Yes Historical Provider, MD  HYDROcodone-acetaminophen (NORCO) 5-325 MG  per tablet Take 1-2 tablets by mouth every 6 (six) hours as needed. 03/06/14   Ernestina Patches, MD  promethazine (PHENERGAN) 25 MG tablet Take 1 tablet (25 mg total) by mouth every 6 (six) hours as needed  for nausea or vomiting. 03/06/14   Ernestina Patches, MD  tamsulosin (FLOMAX) 0.4 MG CAPS capsule Take 1 capsule (0.4 mg total) by mouth daily. 03/06/14   Ernestina Patches, MD   BP 109/55  Pulse 72  Temp(Src) 98.6 F (37 C) (Oral)  Resp 16  SpO2 99% Physical Exam  Constitutional: She is oriented to person, place, and time. She appears well-developed and well-nourished. No distress.  HENT:  Head: Normocephalic and atraumatic.  Mouth/Throat: No oropharyngeal exudate.  Eyes: Pupils are equal, round, and reactive to light.  Neck: Normal range of motion. Neck supple.  Cardiovascular: Normal rate, regular rhythm and normal heart sounds.  Exam reveals no gallop and no friction rub.   No murmur heard. Pulmonary/Chest: Effort normal and breath sounds normal. No respiratory distress. She has no wheezes. She has no rales.  Abdominal: Soft. Bowel sounds are normal. She exhibits no distension and no mass. There is no tenderness. There is CVA tenderness (right). There is no rebound and no guarding.  Musculoskeletal: Normal range of motion. She exhibits no edema and no tenderness.  Neurological: She is alert and oriented to person, place, and time.  Skin: Skin is warm and dry.  Psychiatric: She has a normal mood and affect.    ED Course  Procedures (including critical care time) Labs Review Labs Reviewed  BASIC METABOLIC PANEL - Abnormal; Notable for the following:    Glucose, Bld 131 (*)    GFR calc non Af Amer 79 (*)    All other components within normal limits  CBC - Abnormal; Notable for the following:    WBC 15.5 (*)    RBC 5.13 (*)    HCT 35.7 (*)    MCV 69.6 (*)    MCH 24.2 (*)    All other components within normal limits  URINALYSIS, ROUTINE W REFLEX MICROSCOPIC - Abnormal; Notable for the following:    Color, Urine RED (*)    APPearance TURBID (*)    Hgb urine dipstick LARGE (*)    Protein, ur 30 (*)    Leukocytes, UA SMALL (*)    All other components within normal limits  URINE  MICROSCOPIC-ADD ON - Abnormal; Notable for the following:    Squamous Epithelial / LPF FEW (*)    Bacteria, UA FEW (*)    All other components within normal limits  URINE CULTURE    Imaging Review Ct Renal Stone Study  03/06/2014   CLINICAL DATA:  Right flank and back pain with hematuria beginning at 5 a.m. this morning.  EXAM: CT ABDOMEN AND PELVIS WITHOUT CONTRAST  TECHNIQUE: Multidetector CT imaging of the abdomen and pelvis was performed following the standard protocol without IV contrast.  COMPARISON:  CT abdomen and pelvis 05/09/2008.  FINDINGS: The lung bases are clear.  No pleural or pericardial effusion.  There is mild to moderate right hydronephrosis with stranding about the right kidney and ureter due to a 0.3 cm distal right ureteral stone. The left kidney appears normal. The urinary bladder, seminal vesicles and prostate gland are unremarkable.  The patient is status post cholecystectomy. The liver, spleen, adrenal glands and pancreas appear normal. The stomach and small and large bowel appear normal. The appendix is not visualized and may have been removed. No  evidence of inflammatory process is seen. Small pelvic sidewall lymph nodes are unchanged no pathologically enlarged lymph nodes mass is seen. Aortoiliac atherosclerosis without aneurysm is noted. No focal bony abnormality.  IMPRESSION: Mild to moderate right hydronephrosis due to a 0.3 cm distal right ureteral stone. No other acute abnormality.   Electronically Signed   By: Inge Rise M.D.   On: 03/06/2014 17:46     EKG Interpretation None      MDM   Final diagnoses:  Right flank pain  Ureterolithiasis    Pt is a 53 y.o. female with Pmhx as above who presents with one day of hematuria, dysuria and intermittent right flank pain. She's had associated chills but no fever. No nausea or vomiting. She's had multiple episodes of loose stool which is nonbloody. On physical exam vital signs are stable she is in no acute  distress. Abdominal exam is benign. She is right CVA tenderness.   Patient has 0.3 cm distal right ureteral stone. She is associated mild to moderate right-sided hydronephrosis. No other acute abnormality. Her creatinine is normal. Urine does not appear infected. She's feeling much better after IV fluids one dose of IV morphine. Will discharge home with Norco for pain, Flomax and Phenergan. Outpatient followup for urology given. Return precautions given for new or worsening symptoms including worsening or uncontrolled pain, fever, inability to tolerate liquids.    Ernestina Patches, MD 03/06/14 [phone]

## 2014-03-06 NOTE — Discharge Instructions (Signed)

## 2014-03-07 LAB — URINE CULTURE: Colony Count: 5000

## 2014-06-25 ENCOUNTER — Emergency Department (HOSPITAL_COMMUNITY)
Admission: EM | Admit: 2014-06-25 | Discharge: 2014-06-26 | Disposition: A | Discharge status: Home / Self Care | Payer: BLUE CROSS/BLUE SHIELD | Attending: Emergency Medicine | Admitting: Emergency Medicine

## 2014-06-25 ENCOUNTER — Encounter (HOSPITAL_COMMUNITY): Payer: Self-pay | Admitting: Emergency Medicine

## 2014-06-25 DIAGNOSIS — Z90710 Acquired absence of both cervix and uterus: Secondary | ICD-10-CM | POA: Insufficient documentation

## 2014-06-25 DIAGNOSIS — Z791 Long term (current) use of non-steroidal anti-inflammatories (NSAID): Secondary | ICD-10-CM | POA: Diagnosis not present

## 2014-06-25 DIAGNOSIS — Z87891 Personal history of nicotine dependence: Secondary | ICD-10-CM | POA: Diagnosis not present

## 2014-06-25 DIAGNOSIS — R1013 Epigastric pain: Secondary | ICD-10-CM | POA: Insufficient documentation

## 2014-06-25 DIAGNOSIS — Z79899 Other long term (current) drug therapy: Secondary | ICD-10-CM | POA: Insufficient documentation

## 2014-06-25 DIAGNOSIS — B349 Viral infection, unspecified: Secondary | ICD-10-CM | POA: Diagnosis not present

## 2014-06-25 DIAGNOSIS — Z880 Allergy status to penicillin: Secondary | ICD-10-CM | POA: Insufficient documentation

## 2014-06-25 DIAGNOSIS — I10 Essential (primary) hypertension: Secondary | ICD-10-CM | POA: Insufficient documentation

## 2014-06-25 DIAGNOSIS — R109 Unspecified abdominal pain: Secondary | ICD-10-CM | POA: Diagnosis present

## 2014-06-25 DIAGNOSIS — E119 Type 2 diabetes mellitus without complications: Secondary | ICD-10-CM | POA: Diagnosis not present

## 2014-06-25 MED ORDER — MORPHINE SULFATE 4 MG/ML IJ SOLN
4.0000 mg | Freq: Once | INTRAMUSCULAR | Status: AC
  Administered 2014-06-25: 4 mg via INTRAVENOUS
  Filled 2014-06-25: qty 1

## 2014-06-25 MED ORDER — ONDANSETRON HCL 4 MG/2ML IJ SOLN
4.0000 mg | Freq: Once | INTRAMUSCULAR | Status: AC
Start: 2014-06-25 — End: 2014-06-25
  Administered 2014-06-25: 4 mg via INTRAVENOUS
  Filled 2014-06-25: qty 2

## 2014-06-25 MED ORDER — SODIUM CHLORIDE 0.9 % IV BOLUS (SEPSIS)
1000.0000 mL | Freq: Once | INTRAVENOUS | Status: AC
  Administered 2014-06-25: 1000 mL via INTRAVENOUS

## 2014-06-25 NOTE — ED Notes (Signed)
Pt states that she had been having severe mid level ABD pain since this morning. Pt states she also had some n/v this morning but has not had any since. Pt states she has drank some ginger ale today without vomiting.

## 2014-06-26 LAB — CBC WITH DIFFERENTIAL/PLATELET
Basophils Absolute: 0 10*3/uL (ref 0.0–0.1)
Basophils Relative: 0 % (ref 0–1)
Eosinophils Absolute: 0.1 10*3/uL (ref 0.0–0.7)
Eosinophils Relative: 1 % (ref 0–5)
HCT: 36.3 % (ref 36.0–46.0)
Hemoglobin: 12.4 g/dL (ref 12.0–15.0)
Lymphocytes Relative: 24 % (ref 12–46)
Lymphs Abs: 2.4 10*3/uL (ref 0.7–4.0)
MCH: 24.4 pg — ABNORMAL LOW (ref 26.0–34.0)
MCHC: 34.2 g/dL (ref 30.0–36.0)
MCV: 71.3 fL — ABNORMAL LOW (ref 78.0–100.0)
Monocytes Absolute: 0.7 10*3/uL (ref 0.1–1.0)
Monocytes Relative: 6 % (ref 3–12)
Neutro Abs: 7.2 10*3/uL (ref 1.7–7.7)
Neutrophils Relative %: 69 % (ref 43–77)
Platelets: 254 10*3/uL (ref 150–400)
RBC: 5.09 MIL/uL (ref 3.87–5.11)
RDW: 15.4 % (ref 11.5–15.5)
WBC: 10.4 10*3/uL (ref 4.0–10.5)

## 2014-06-26 LAB — COMPREHENSIVE METABOLIC PANEL
ALT: 20 U/L (ref 0–35)
AST: 24 U/L (ref 0–37)
Albumin: 3.5 g/dL (ref 3.5–5.2)
Alkaline Phosphatase: 107 U/L (ref 39–117)
Anion gap: 9 (ref 5–15)
BUN: 10 mg/dL (ref 6–23)
CO2: 27 mmol/L (ref 19–32)
Calcium: 9.2 mg/dL (ref 8.4–10.5)
Chloride: 102 mmol/L (ref 96–112)
Creatinine, Ser: 0.89 mg/dL (ref 0.50–1.10)
GFR calc Af Amer: 85 mL/min — ABNORMAL LOW (ref >90)
GFR calc non Af Amer: 73 mL/min — ABNORMAL LOW (ref >90)
Glucose, Bld: 142 mg/dL — ABNORMAL HIGH (ref 70–99)
Potassium: 3.9 mmol/L (ref 3.5–5.1)
Sodium: 138 mmol/L (ref 135–145)
Total Bilirubin: 0.8 mg/dL (ref 0.3–1.2)
Total Protein: 8 g/dL (ref 6.0–8.3)

## 2014-06-26 LAB — LIPASE, BLOOD: Lipase: 21 U/L (ref 11–59)

## 2014-06-26 MED ORDER — HYDROCODONE-ACETAMINOPHEN 5-325 MG PO TABS: 2.0000 | ORAL_TABLET | ORAL | Status: DC | PRN

## 2014-06-26 MED ORDER — ONDANSETRON 4 MG PO TBDP: 4.0000 mg | ORAL_TABLET | Freq: Three times a day (TID) | ORAL | Status: DC | PRN

## 2014-06-26 NOTE — ED Provider Notes (Signed)
CSN: [id number]     Arrival date & time 06/25/14  2255 History   First MD Initiated Contact with Patient 06/25/14 2334     Chief Complaint  Patient presents with  . Abdominal Pain     (Consider location/radiation/quality/duration/timing/severity/associated sxs/prior Treatment) HPI Comments: Patient is a 54 year old female with a past medical history of diabetes and hypertension who presents with abdominal pain that started this morning. The pain is located in the epigastrium and does not radiate. The pain is described as aching and severe. The pain started gradually and progressively worsened since the onset. No alleviating/aggravating factors. The patient has tried ginger ale for symptoms without relief. Associated symptoms include nausea and vomiting. Patient denies fever, headache, diarrhea, chest pain, SOB, dysuria, constipation, abnormal vaginal bleeding/discharge. No previous abdominal surgeries.      Past Medical History  Diagnosis Date  . Diabetes mellitus without complication   . Hypertension    Past Surgical History  Procedure Laterality Date  . Appendectomy    . Abdominal hysterectomy     No family history on file. History  Substance Use Topics  . Smoking status: Former Smoker -- 0.70 packs/day for 30 years    Types: Cigarettes  . Smokeless tobacco: Never Used  . Alcohol Use: No   OB History    No data available     Review of Systems  Constitutional: Negative for fever, chills and fatigue.  HENT: Negative for trouble swallowing.   Eyes: Negative for visual disturbance.  Respiratory: Negative for shortness of breath.   Cardiovascular: Negative for chest pain and palpitations.  Gastrointestinal: Positive for nausea, vomiting and abdominal pain. Negative for diarrhea.  Genitourinary: Negative for dysuria and difficulty urinating.  Musculoskeletal: Negative for arthralgias and neck pain.  Skin: Negative for color change.  Neurological: Negative for dizziness and  weakness.  Psychiatric/Behavioral: Negative for dysphoric mood.      Allergies  Orange fruit and Penicillins  Home Medications   Prior to Admission medications   Medication Sig Start Date End Date Taking? Authorizing Provider  glyBURIDE (DIABETA) 5 MG tablet Take 1 tablet (5 mg total) by mouth daily with breakfast. 06/04/13  Yes Darlyne Russian, MD  losartan-hydrochlorothiazide (HYZAAR) 50-12.5 MG per tablet Take 1 tablet by mouth daily.   Yes Historical Provider, MD  alum & mag hydroxide-simeth (MAALOX/MYLANTA) 200-200-20 MG/5ML suspension Take 30 mLs by mouth every 6 (six) hours as needed for indigestion or heartburn (gas).    Historical Provider, MD  HYDROcodone-acetaminophen (NORCO) 5-325 MG per tablet Take 1-2 tablets by mouth every 6 (six) hours as needed. 03/06/14   Ernestina Patches, MD  naproxen sodium (ANAPROX) 220 MG tablet Take 220 mg by mouth daily as needed (pain).    Historical Provider, MD  promethazine (PHENERGAN) 25 MG tablet Take 1 tablet (25 mg total) by mouth every 6 (six) hours as needed for nausea or vomiting. 03/06/14   Ernestina Patches, MD  tamsulosin (FLOMAX) 0.4 MG CAPS capsule Take 1 capsule (0.4 mg total) by mouth daily. 03/06/14   Ernestina Patches, MD   BP 143/72 mmHg  Pulse 99  Temp(Src) 98.7 F (37.1 C) (Oral)  Resp 20  SpO2 100% Physical Exam  Constitutional: She is oriented to person, place, and time. She appears well-developed and well-nourished. No distress.  HENT:  Head: Normocephalic and atraumatic.  Eyes: Conjunctivae and EOM are normal.  Neck: Normal range of motion.  Cardiovascular: Normal rate and regular rhythm.  Exam reveals no gallop and no friction  rub.   No murmur heard. Pulmonary/Chest: Effort normal and breath sounds normal. She has no wheezes. She has no rales. She exhibits no tenderness.  Abdominal: Soft. She exhibits no distension. There is tenderness. There is no rebound.  Mild epigastric tenderness to palpation. No other focal  tenderness or peritoneal signs.   Musculoskeletal: Normal range of motion.  Neurological: She is alert and oriented to person, place, and time. Coordination normal.  Speech is goal-oriented. Moves limbs without ataxia.   Skin: Skin is warm and dry.  Psychiatric: She has a normal mood and affect. Her behavior is normal.  Nursing note and vitals reviewed.   ED Course  Procedures (including critical care time) Labs Review Labs Reviewed  CBC WITH DIFFERENTIAL/PLATELET - Abnormal; Notable for the following:    MCV 71.3 (*)    MCH 24.4 (*)    All other components within normal limits  COMPREHENSIVE METABOLIC PANEL - Abnormal; Notable for the following:    Glucose, Bld 142 (*)    GFR calc non Af Amer 73 (*)    GFR calc Af Amer 85 (*)    All other components within normal limits  LIPASE, BLOOD    Imaging Review No results found.   EKG Interpretation   Date/Time:  Friday June 25 2014 23:32:42 EST Ventricular Rate:  95 PR Interval:  138 QRS Duration: 84 QT Interval:  343 QTC Calculation: 431 R Axis:   38 Text Interpretation:  Sinus rhythm Abnormal R-wave progression, early  transition No significant change was found Confirmed by MOLPUS  MD, Jenny Reichmann  [phone]) on 06/25/2014 11:57:17 PM      MDM   Final diagnoses:  Epigastric pain  Viral illness    12:13 AM  Labs and urinalysis pending. Vitals stable and patient afebrile. Patient given morphine and zofran for symptoms.   Labs unremarkable for acute changes. Vitals stable and patient afebrile. Patient reports improvement of symptoms. Patient likely has a viral illness. Patient instructed to return with worsening or concerning symptoms.    Alvina Chou, PA-C 06/26/14 2132  Wynetta Fines, MD 07/03/14 2240

## 2014-06-26 NOTE — Discharge Instructions (Signed)
Take vicodin as needed for pain. Take zofran as needed for nausea. Refer to attached documents for more information. Return to the ED with worsening or concerning symptoms.

## 2014-11-06 ENCOUNTER — Emergency Department (INDEPENDENT_AMBULATORY_CARE_PROVIDER_SITE_OTHER)
Admission: EM | Admit: 2014-11-06 | Discharge: 2014-11-06 | Disposition: A | Discharge status: Home / Self Care | Payer: Worker's Compensation | Source: Home / Self Care | Attending: Family Medicine | Admitting: Family Medicine

## 2014-11-06 ENCOUNTER — Encounter (HOSPITAL_COMMUNITY): Payer: Self-pay | Admitting: *Deleted

## 2014-11-06 DIAGNOSIS — S60312A Abrasion of left thumb, initial encounter: Secondary | ICD-10-CM | POA: Diagnosis not present

## 2014-11-06 DIAGNOSIS — Z23 Encounter for immunization: Secondary | ICD-10-CM | POA: Diagnosis not present

## 2014-11-06 MED ORDER — TETANUS-DIPHTH-ACELL PERTUSSIS 5-2.5-18.5 LF-MCG/0.5 IM SUSP
0.5000 mL | Freq: Once | INTRAMUSCULAR | Status: AC
  Administered 2014-11-06: 0.5 mL via INTRAMUSCULAR

## 2014-11-06 MED ORDER — TETANUS-DIPHTH-ACELL PERTUSSIS 5-2.5-18.5 LF-MCG/0.5 IM SUSP
INTRAMUSCULAR | Status: AC
  Filled 2014-11-06: qty 0.5
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
## 2014-11-06 NOTE — Discharge Instructions (Signed)
Wash regularly, you had a tetanus booster, ok to work, return if any problems.

## 2014-11-08 ENCOUNTER — Other Ambulatory Visit: Payer: Self-pay

## 2014-11-08 DIAGNOSIS — Z1231 Encounter for screening mammogram for malignant neoplasm of breast: Secondary | ICD-10-CM

## 2014-11-26 ENCOUNTER — Ambulatory Visit (INDEPENDENT_AMBULATORY_CARE_PROVIDER_SITE_OTHER): Payer: BLUE CROSS/BLUE SHIELD | Admitting: Family Medicine

## 2014-11-26 ENCOUNTER — Ambulatory Visit (INDEPENDENT_AMBULATORY_CARE_PROVIDER_SITE_OTHER): Payer: BLUE CROSS/BLUE SHIELD

## 2014-11-26 VITALS — BP 106/68 | HR 88 | Temp 98.4°F | Resp 16 | Ht 65.25 in | Wt 215.4 lb

## 2014-11-26 DIAGNOSIS — N958 Other specified menopausal and perimenopausal disorders: Secondary | ICD-10-CM | POA: Diagnosis not present

## 2014-11-26 DIAGNOSIS — M546 Pain in thoracic spine: Secondary | ICD-10-CM

## 2014-11-26 DIAGNOSIS — G4700 Insomnia, unspecified: Secondary | ICD-10-CM

## 2014-11-26 DIAGNOSIS — E119 Type 2 diabetes mellitus without complications: Secondary | ICD-10-CM

## 2014-11-26 DIAGNOSIS — E8941 Symptomatic postprocedural ovarian failure: Secondary | ICD-10-CM

## 2014-11-26 DIAGNOSIS — I10 Essential (primary) hypertension: Secondary | ICD-10-CM | POA: Diagnosis not present

## 2014-11-26 DIAGNOSIS — Z1211 Encounter for screening for malignant neoplasm of colon: Secondary | ICD-10-CM | POA: Diagnosis not present

## 2014-11-26 DIAGNOSIS — F4010 Social phobia, unspecified: Secondary | ICD-10-CM | POA: Diagnosis not present

## 2014-11-26 LAB — POCT URINALYSIS DIPSTICK
Bilirubin, UA: NEGATIVE
Glucose, UA: NEGATIVE
Leukocytes, UA: NEGATIVE
Nitrite, UA: NEGATIVE
Spec Grav, UA: 1.025
Urobilinogen, UA: 0.2
pH, UA: 6

## 2014-11-26 LAB — POCT UA - MICROSCOPIC ONLY
Casts, Ur, LPF, POC: NEGATIVE
Crystals, Ur, HPF, POC: NEGATIVE
Mucus, UA: NEGATIVE
Yeast, UA: NEGATIVE

## 2014-11-26 LAB — LIPID PANEL
Cholesterol: 201 mg/dL — ABNORMAL HIGH (ref 0–200)
HDL: 44 mg/dL — ABNORMAL LOW (ref >=46)
LDL Cholesterol: 132 mg/dL — ABNORMAL HIGH (ref 0–99)
Total CHOL/HDL Ratio: 4.6 Ratio
Triglycerides: 126 mg/dL (ref <150)
VLDL: 25 mg/dL (ref 0–40)

## 2014-11-26 LAB — VITAMIN B12: Vitamin B-12: 817 pg/mL (ref 211–911)

## 2014-11-26 LAB — CBC WITH DIFFERENTIAL/PLATELET
Basophils Absolute: 0.1 10*3/uL (ref 0.0–0.1)
Basophils Relative: 1 % (ref 0–1)
Eosinophils Absolute: 0.2 10*3/uL (ref 0.0–0.7)
Eosinophils Relative: 2 % (ref 0–5)
HCT: 37.9 % (ref 36.0–46.0)
Hemoglobin: 12.7 g/dL (ref 12.0–15.0)
Lymphocytes Relative: 26 % (ref 12–46)
Lymphs Abs: 2.6 10*3/uL (ref 0.7–4.0)
MCH: 24 pg — ABNORMAL LOW (ref 26.0–34.0)
MCHC: 33.5 g/dL (ref 30.0–36.0)
MCV: 71.6 fL — ABNORMAL LOW (ref 78.0–100.0)
MPV: 9.8 fL (ref 8.6–12.4)
Monocytes Absolute: 0.7 10*3/uL (ref 0.1–1.0)
Monocytes Relative: 7 % (ref 3–12)
Neutro Abs: 6.3 10*3/uL (ref 1.7–7.7)
Neutrophils Relative %: 64 % (ref 43–77)
Platelets: 340 10*3/uL (ref 150–400)
RBC: 5.29 MIL/uL — ABNORMAL HIGH (ref 3.87–5.11)
RDW: 16.6 % — ABNORMAL HIGH (ref 11.5–15.5)
WBC: 9.9 10*3/uL (ref 4.0–10.5)

## 2014-11-26 LAB — COMPREHENSIVE METABOLIC PANEL
ALT: 19 U/L (ref 0–35)
AST: 15 U/L (ref 0–37)
Albumin: 4.1 g/dL (ref 3.5–5.2)
Alkaline Phosphatase: 125 U/L — ABNORMAL HIGH (ref 39–117)
BUN: 12 mg/dL (ref 6–23)
CO2: 28 mEq/L (ref 19–32)
Calcium: 10.4 mg/dL (ref 8.4–10.5)
Chloride: 98 mEq/L (ref 96–112)
Creat: 0.85 mg/dL (ref 0.50–1.10)
Glucose, Bld: 134 mg/dL — ABNORMAL HIGH (ref 70–99)
Potassium: 4.1 mEq/L (ref 3.5–5.3)
Sodium: 137 mEq/L (ref 135–145)
Total Bilirubin: 0.5 mg/dL (ref 0.2–1.2)
Total Protein: 8.4 g/dL — ABNORMAL HIGH (ref 6.0–8.3)

## 2014-11-26 LAB — MICROALBUMIN, URINE: Microalb, Ur: 1.3 mg/dL (ref <2.0)

## 2014-11-26 LAB — VITAMIN D 25 HYDROXY (VIT D DEFICIENCY, FRACTURES): Vit D, 25-Hydroxy: 7 ng/mL — ABNORMAL LOW (ref 30–100)

## 2014-11-26 LAB — GLUCOSE, POCT (MANUAL RESULT ENTRY): POC Glucose: 136 mg/dl — AB (ref 70–99)

## 2014-11-26 LAB — POCT GLYCOSYLATED HEMOGLOBIN (HGB A1C): Hemoglobin A1C: 7.4

## 2014-11-26 LAB — TSH: TSH: 2.506 u[IU]/mL (ref 0.350–4.500)

## 2014-11-26 MED ORDER — METHOCARBAMOL 500 MG PO TABS: 500.0000 mg | ORAL_TABLET | Freq: Four times a day (QID) | ORAL | Status: DC

## 2014-11-26 MED ORDER — SERTRALINE HCL 50 MG PO TABS
50.0000 mg | ORAL_TABLET | Freq: Every day | ORAL | Status: DC
Start: 2014-11-26 — End: 2015-06-03

## 2014-11-26 MED ORDER — LOSARTAN POTASSIUM-HCTZ 50-12.5 MG PO TABS
1.0000 | ORAL_TABLET | Freq: Every day | ORAL | Status: DC
Start: 2014-11-26 — End: 2017-04-13

## 2014-11-26 MED ORDER — GLYBURIDE 5 MG PO TABS: 5.0000 mg | ORAL_TABLET | Freq: Every day | ORAL | Status: DC

## 2014-11-26 MED ORDER — MELOXICAM 15 MG PO TABS
15.0000 mg | ORAL_TABLET | Freq: Every day | ORAL | Status: DC
Start: 2014-11-26 — End: 2015-06-03

## 2014-11-26 NOTE — Progress Notes (Addendum)
Patient ID: Karen [name], female   DOB: 10-20-1960, 54 y.o.   MRN: [id number]   Subjective:  This chart was scribed for Karen Forts, MD by [facility], medical scribe at [facility].The patient was seen in exam room 11 and the patient's care was started at 8:30 AM.   Patient ID: Karen [name], female    DOB: 04-25-1961, 54 y.o.   MRN: [id number]  11/26/2014  Back Pain; Insomnia; and Depression  HPI HPI Comments: ZAKHIA SERES is a 54 y.o. female who presents to [facility] complaining of left middle back pain rated 6/10 that began yesterday evening while at work. 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 She woke up at 6:30 AM due to the pain, movement worsens her pain. +Nausea, indigestion, constipation which began yesterday are associated with the pain. The pain is non radiating. Normal appetite. Last normal BM was this morning but this did not help her pain. She has taken gas-x, alka seltzer plus, tums earlier in the week for relief. History of kidney stones on the right side. She denies fever, chills sweats, vomiting, diarrhea, hematuria, increased urinary frequency, worsening nocturia  (typically gets up once a night), rash, chest pain, leg swelling, and shortness of breath.  History of hypertension and diabetes. She does take losartan but only when she has a headache. She does not take her medication for diabetes.  Last follow up for DMII in 05/2013.   Pt states she has been depressed since she was 68. Positive for anhedonia. She does not want to go to work, she would prefer to stay at home. She does not participate in family functions. She becomes nervous around large crowds. Pt only leaves her home for work; she does not miss work.   Pt also noticed she has been forgetting things. Never been through counseling, she thinks its time. No thoughts of self injury or suicidal ideation.    She complains of hot flashes and she is  afraid to take medication for hot flashes because of possibility of developing cancer.  Excessive sweating is embarrassing to patient thus she limits social outings and dating.  Onset of sweats in 20s after hysterectomy.  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 Smokes a pack a week, no alcohol, no illicit drug use and mild exercise.  No previous colonoscopy; agreeable; scheduled for mammogram this month. No recent CPE.  Review of Systems  Constitutional: Negative for fever, chills, diaphoresis and fatigue.  HENT: Negative for ear pain, postnasal drip, rhinorrhea, sinus pressure, sore throat and trouble swallowing.   Eyes: Negative for visual disturbance.  Respiratory: Negative for cough and shortness of breath.   Cardiovascular: Negative for chest pain, palpitations and leg swelling.  Gastrointestinal: Positive for nausea and constipation. Negative for vomiting, abdominal pain and diarrhea.  Endocrine: Negative for cold intolerance, heat intolerance, polydipsia, polyphagia and polyuria.  Genitourinary: Negative for dysuria, urgency, frequency, hematuria, genital sores and pelvic pain.  Musculoskeletal: Positive for back pain.  Skin: Negative for rash.  Neurological: Negative for dizziness, tremors, seizures, syncope, facial asymmetry, speech difficulty, weakness, light-headedness, numbness and headaches.  Psychiatric/Behavioral: Positive for sleep disturbance and dysphoric mood. Negative for suicidal ideas and self-injury. The patient is nervous/anxious.    Past Medical History  Diagnosis Date  . Diabetes mellitus without complication   . Hypertension    Past Surgical History  Procedure Laterality  Date  . Appendectomy    . Abdominal hysterectomy      PID; ovaries resected.  Age 68.   Allergies  Allergen Reactions  . Orange Fruit [Citrus] Hives  . Penicillins Hives   History   Social History  . Marital Status: Divorced     Spouse Name: N/A  . Number of Children: N/A  . Years of Education: N/A   Occupational History  . Not on file.   Social History Main Topics  . Smoking status: Former Smoker -- 0.70 packs/day for 30 years    Types: Cigarettes  . Smokeless tobacco: Never Used  . Alcohol Use: No  . Drug Use: No  . Sexual Activity: No   Other Topics Concern  . Not on file   Social History Narrative   Marital status: divorced; not dating      Children:  None      Lives: alone      Employment: Radio broadcast assistant       Tobacco: sometimes; smokes 1 pack per week       Alcohol:  None      Drugs: none      Exercise:  sporadic   Family History  Problem Relation Age of Onset  . Cancer Mother 63    uterine cancer  . Heart disease Sister 32    AMI/CAD with stenting  . Hypertension Sister        Objective:    BP 106/68 mmHg  Pulse 88  Temp(Src) 98.4 F (36.9 C) (Oral)  Resp 16  Ht 5' 5.25" (1.657 m)  Wt 215 lb 6.4 oz (97.705 kg)  BMI 35.59 kg/m2  SpO2 99% Physical Exam  Constitutional: She is oriented to person, place, and time. She appears well-developed and well-nourished. No distress.  obese  HENT:  Head: Normocephalic and atraumatic.  Right Ear: External ear normal.  Left Ear: External ear normal.  Nose: Nose normal.  Mouth/Throat: Oropharynx is clear and moist.  Eyes: Conjunctivae and EOM are normal. Pupils are equal, round, and reactive to light.  Neck: Normal range of motion. Neck supple. Carotid bruit is not present. No thyromegaly present.  Cardiovascular: Normal rate, regular rhythm, normal heart sounds and intact distal pulses.  Exam reveals no gallop and no friction rub.   No murmur heard. Pulmonary/Chest: Effort normal and breath sounds normal. She has no wheezes. She has no rales.  Abdominal: Soft. Bowel sounds are normal. She exhibits no distension and no mass. There is no tenderness. There is no rebound and no guarding.  Musculoskeletal:       Cervical back: Normal. She  exhibits normal range of motion, no tenderness, no bony tenderness, no pain, no spasm and normal pulse.       Thoracic back: She exhibits decreased range of motion, tenderness, pain and spasm. She exhibits no bony tenderness and no swelling.       Lumbar back: She exhibits normal range of motion, no tenderness, no bony tenderness, no pain, no spasm and normal pulse.  TTP at the thoracic perispinal muscles on the left.  Lymphadenopathy:    She has no cervical adenopathy.  Neurological: She is alert and oriented to person, place, and time. No cranial nerve deficit.  Skin: Skin is warm and dry. No rash noted. She is not diaphoretic. No erythema. No pallor.  Psychiatric: She has a normal mood and affect. Her behavior is normal.  tearful   Results for orders placed or performed in visit on 11/26/14  POCT urinalysis dipstick  Result Value Ref Range   Color, UA yellow    Clarity, UA slightly cloudy    Glucose, UA neg    Bilirubin, UA neg    Ketones, UA trace    Spec Grav, UA 1.025    Blood, UA trace-intact    pH, UA 6.0    Protein, UA trace    Urobilinogen, UA 0.2    Nitrite, UA neg    Leukocytes, UA Negative Negative  POCT UA - Microscopic Only  Result Value Ref Range   WBC, Ur, HPF, POC 0-1    RBC, urine, microscopic 0-1    Bacteria, U Microscopic few    Mucus, UA neg    Epithelial cells, urine per micros 2-3    Crystals, Ur, HPF, POC neg    Casts, Ur, LPF, POC neg    Yeast, UA neg   POCT glucose (manual entry)  Result Value Ref Range   POC Glucose 136 (A) 70 - 99 mg/dl  POCT glycosylated hemoglobin (Hb A1C)  Result Value Ref Range   Hemoglobin A1C 7.4      [facility] reading (PRIMARY) by  Dr. Tamala Julian.  THORACIC SPINE FILMS:  DEGENERATIVE CHANGES; NO COMPRESSION FRACTURE; NO LYTIC LESIONS.   Assessment & Plan:   1. Type 2 diabetes mellitus without complication   2. Essential hypertension   3. Left-sided thoracic back pain   4. Hot flashes due to surgical menopause   5. Insomnia    6. Social anxiety disorder   7. Screening for colon cancer     1. DMII: uncontrolled; intolerant to Metformin in the past; refill of Glyburide provided.  2.  HTN: controlled; obtain labs; refill provided. 3.  L sided thoracic strain:  New. Rx for Meloxicam and Robaxin provided; heat to area; frequent ambulation; recommend stretching. 4.  Hot flashes menopausal: New. Onset since age 68; rx for Zoloft provided.  Not a good candidate for HRT due to DMII, HTN. 5.  Insomnia: Chronic; treat anxiety and depression and reevaluate; works 2nd shift. 6.  Social anxiety: New.  Refer for therapy; rx for Zoloft; follow-up three months. 7.  Screening colon cancer: refer for colonoscopy/GI.   Meds ordered this encounter  Medications  . meloxicam (MOBIC) 15 MG tablet    Sig: Take 1 tablet (15 mg total) by mouth daily.    Dispense:  30 tablet    Refill:  0  . methocarbamol (ROBAXIN) 500 MG tablet    Sig: Take 1-2 tablets (500-1,000 mg total) by mouth 4 (four) times daily.    Dispense:  40 tablet    Refill:  0  . sertraline (ZOLOFT) 50 MG tablet    Sig: Take 1 tablet (50 mg total) by mouth at bedtime.    Dispense:  30 tablet    Refill:  5  . glyBURIDE (DIABETA) 5 MG tablet    Sig: Take 1 tablet (5 mg total) by mouth daily with breakfast.    Dispense:  30 tablet    Refill:  5  . losartan-hydrochlorothiazide (HYZAAR) 50-12.5 MG per tablet    Sig: Take 1 tablet by mouth daily.    Dispense:  30 tablet    Refill:  5    Return in about 3 months (around 02/26/2015) for complete physical examiniation.  I personally performed the services described in this documentation, which was scribed in my presence. The recorded information has been reviewed and considered.  Kristi Elayne Guerin, M.D. [facility]  [address], [place]  [phone] phone [phone] fax

## 2014-11-26 NOTE — Patient Instructions (Signed)
Thoracic Strain You have injured the muscles or tendons that attach to the upper part of your back behind your chest. This injury is called a thoracic strain, thoracic sprain, or mid-back strain.  CAUSES  The cause of thoracic strain varies. A less severe injury involves pulling a muscle or tendon without tearing it. A more severe injury involves tearing (rupturing) a muscle or tendon. With less severe injuries, there may be little loss of strength. Sometimes, there are breaks (fractures) in the bones to which the muscles are attached. These fractures are rare, unless there was a direct hit (trauma) or you have weak bones due to osteoporosis or age. Longstanding strains may be caused by overuse or improper form during certain movements. Obesity can also increase your risk for back injuries. Sudden strains may occur due to injury or not warming up properly before exercise. Often, there is no obvious cause for a thoracic strain. SYMPTOMS  The main symptom is pain, especially with movement, such as during exercise. DIAGNOSIS  Your caregiver can usually tell what is wrong by taking an X-ray and doing a physical exam. TREATMENT   Physical therapy may be helpful for recovery. Your caregiver can give you exercises to do or refer you to a physical therapist after your pain improves.  After your pain improves, strengthening and conditioning programs appropriate for your sport or occupation may be helpful.  Always warm up before physical activities or athletics. Stretching after physical activity may also help.  Certain over-the-counter medicines may also help. Ask your caregiver if there are medicines that would help you. If this is your first thoracic strain injury, proper care and proper healing time before starting activities should prevent long-term problems. Torn ligaments and tendons require as long to heal as broken bones. Average healing times may be only 1 week for a mild strain. For torn muscles  and tendons, healing time may be up to 6 weeks to 2 months. HOME CARE INSTRUCTIONS   Apply ice to the injured area. Ice massages may also be used as directed.  Put ice in a plastic bag.  Place a towel between your skin and the bag.  Leave the ice on for 15-20 minutes, 03-04 times a day, for the first 2 days.  Only take over-the-counter or prescription medicines for pain, discomfort, or fever as directed by your caregiver.  Keep your appointments for physical therapy if this was prescribed.  Use wraps and back braces as instructed. SEEK IMMEDIATE MEDICAL CARE IF:   You have an increase in bruising, swelling, or pain.  Your pain has not improved with medicines.  You develop new shortness of breath, chest pain, or fever.  Problems seem to be getting worse rather than better. MAKE SURE YOU:   Understand these instructions.  Will watch your condition.  Will get help right away if you are not doing well or get worse. Document Released: 07/28/2003 Document Revised: 07/30/2011 Document Reviewed: 06/23/2010 [facility] Patient Information 2015 [place], [place]. This information is not intended to replace advice given to you by your health care provider. Make sure you discuss any questions you have with your health care provider.

## 2014-12-13 ENCOUNTER — Ambulatory Visit
Admission: RE | Admit: 2014-12-13 | Discharge: 2014-12-13 | Disposition: A | Discharge status: Home / Self Care | Payer: BLUE CROSS/BLUE SHIELD | Source: Ambulatory Visit

## 2014-12-13 DIAGNOSIS — Z1231 Encounter for screening mammogram for malignant neoplasm of breast: Secondary | ICD-10-CM

## 2015-02-09 ENCOUNTER — Ambulatory Visit (INDEPENDENT_AMBULATORY_CARE_PROVIDER_SITE_OTHER): Payer: BLUE CROSS/BLUE SHIELD | Admitting: Family Medicine

## 2015-02-09 VITALS — BP 124/84 | HR 93 | Temp 98.8°F | Resp 18 | Ht 65.0 in | Wt 213.0 lb

## 2015-02-09 DIAGNOSIS — T50995A Adverse effect of other drugs, medicaments and biological substances, initial encounter: Secondary | ICD-10-CM | POA: Diagnosis not present

## 2015-02-09 DIAGNOSIS — E119 Type 2 diabetes mellitus without complications: Secondary | ICD-10-CM | POA: Diagnosis not present

## 2015-02-09 LAB — GLUCOSE, POCT (MANUAL RESULT ENTRY): POC Glucose: 124 mg/dl — AB (ref 70–99)

## 2015-02-09 MED ORDER — RANITIDINE HCL 150 MG PO TABS
150.0000 mg | ORAL_TABLET | Freq: Once | ORAL | Status: AC
  Administered 2015-02-09: 150 mg via ORAL

## 2015-02-09 MED ORDER — METHYLPREDNISOLONE SODIUM SUCC 125 MG IJ SOLR
125.0000 mg | Freq: Once | INTRAMUSCULAR | Status: AC
  Administered 2015-02-09: 125 mg via INTRAMUSCULAR

## 2015-02-09 MED ORDER — FLUOCINOLONE ACETONIDE 0.01 % EX SOLN: Freq: Two times a day (BID) | CUTANEOUS | Status: DC

## 2015-02-09 MED ORDER — PREDNISONE 20 MG PO TABS: ORAL_TABLET | ORAL | Status: DC

## 2015-02-09 MED ORDER — RANITIDINE HCL 150 MG PO TABS: 150.0000 mg | ORAL_TABLET | Freq: Two times a day (BID) | ORAL | Status: DC

## 2015-02-09 MED ORDER — CETIRIZINE HCL 10 MG PO TABS
10.0000 mg | ORAL_TABLET | Freq: Once | ORAL | Status: AC
  Administered 2015-02-09: 10 mg via ORAL

## 2015-02-09 NOTE — Patient Instructions (Addendum)
Wash her hair well with shampoo. Use warm rather than hot water. Hot Will make you itch more.  Apply the cortisone solution to the scalp as directed twice daily.  Take the prednisone beginning tomorrow morning 2 pills daily for 3 days, then 1 daily for 3 days  Take ranitidine 150 mg twice daily beginning tonight. You have received a first dose here in the office.  Take cetirizine (Zyrtec) 10 mg daily beginning tomorrow. You have received the first dose here in the office. This can be purchased over-the-counter.  If worse such as involvement of the lips or breathing difficulty go to the emergency room or call 911 if necessary. I expect this will start improving a lot by tomorrow, though I expect you to still itch a good deal tonight.  Return here as needed

## 2015-02-09 NOTE — Progress Notes (Signed)
Patient ID: Karen [name], female    DOB: July 17, 1960  Age: 54 y.o. MRN: [id number]  Chief Complaint  Patient presents with  . Allergic Reaction    hair dye-sores in head since yesterday   . Headache  . Facial Pain    Subjective:   Patient had her hair done yesterday. During the night she started itching a lot her head. If felt moist up there. She has not washed her hair anything, went to work today, and came on in here. She is itching badly in her scalp.  Current allergies, medications, problem list, past/family and social histories reviewed.  Objective:  BP 124/84 mmHg  Pulse 93  Temp(Src) 98.8 F (37.1 C) (Oral)  Resp 18  Ht 5\' 5"  (1.651 m)  Wt 213 lb (96.616 kg)  BMI 35.44 kg/m2  SpO2 98%  Rash in her scalp. Due to the tight heritage hard to see it well but it is wet. Moisture onto the gloves. It is obviously weeping. Has a little bit rash around the periphery of the scalp. The lobes of the ears are getting a little puffy. She has a history of previously reacting to a hair dye.  Assessment & Plan:   Assessment: 1. Allergic reaction to dye, initial encounter   2. Type 2 diabetes mellitus without complication       Plan: Orders Placed This Encounter  Procedures  . POCT glucose (manual entry)    Results for orders placed or performed in visit on 02/09/15  POCT glucose (manual entry)  Result Value Ref Range   POC Glucose 124 (A) 70 - 99 mg/dl     We'll give her a shot of Solu-Medrol and some additional oral prednisone. It will raise her blood sugars probably.  Patient Instructions  Wash her hair well with shampoo. Use warm rather than hot water. Hot Will make you itch more.  Apply the cortisone solution to the scalp as directed twice daily.  Take the prednisone beginning tomorrow morning 2 pills daily for 3 days, then 1 daily for 3 days  Take ranitidine 150 mg twice daily beginning tonight. You have received a first dose here in the office.  Take  cetirizine (Zyrtec) 10 mg daily beginning tomorrow. You have received the first dose here in the office. This can be purchased over-the-counter.  If worse such as involvement of the lips or breathing difficulty go to the emergency room or call 911 if necessary. I expect this will start improving a lot by tomorrow, though I expect you to still itch a good deal tonight.  Return here as needed     Return if symptoms worsen or fail to improve.   HOPPER,DAVID, MD 02/09/2015

## 2015-02-11 ENCOUNTER — Telehealth: Payer: Self-pay

## 2015-02-11 NOTE — Telephone Encounter (Signed)
Patient was seen for an allergy reaction to hair dye and the prescription for the solution that was sent to pharmacy. The pharmacy told her that it won't be ready until Tuesday. Patient states that her sores are running down her neck and they are very itchy. She also states that allergy medication have not been helping her situation. Please advise patient! [phone]

## 2015-02-13 NOTE — Telephone Encounter (Signed)
Which was not available yet?  The Synelar?  Ask the pharmacy if they have a topical triamcinolone solution that we could substitute.    If patient not improved from the solumedrol and prednisone she can return for recheck.  She was pretty miserable with itching.

## 2015-02-14 NOTE — Telephone Encounter (Signed)
Left message for pt to call back  °

## 2015-02-16 NOTE — Telephone Encounter (Signed)
She will RTC to be seen.

## 2015-02-16 NOTE — Telephone Encounter (Signed)
Spoke with pt, advised message from Dr. Linna Darner. She states it has gotten better since she has been on the prednisone. She does not want the medication as of right now.

## 2015-02-28 ENCOUNTER — Encounter: Payer: Self-pay | Admitting: Family Medicine

## 2015-05-16 ENCOUNTER — Encounter: Payer: BLUE CROSS/BLUE SHIELD | Admitting: Family Medicine

## 2015-05-23 ENCOUNTER — Encounter: Payer: BLUE CROSS/BLUE SHIELD | Admitting: Family Medicine

## 2015-06-03 ENCOUNTER — Ambulatory Visit (INDEPENDENT_AMBULATORY_CARE_PROVIDER_SITE_OTHER): Payer: BLUE CROSS/BLUE SHIELD

## 2015-06-03 ENCOUNTER — Ambulatory Visit (INDEPENDENT_AMBULATORY_CARE_PROVIDER_SITE_OTHER): Payer: BLUE CROSS/BLUE SHIELD | Admitting: Emergency Medicine

## 2015-06-03 VITALS — BP 118/76 | HR 92 | Temp 98.5°F | Resp 16 | Ht 65.0 in | Wt 215.0 lb

## 2015-06-03 DIAGNOSIS — J3089 Other allergic rhinitis: Secondary | ICD-10-CM | POA: Diagnosis not present

## 2015-06-03 DIAGNOSIS — R0981 Nasal congestion: Secondary | ICD-10-CM | POA: Diagnosis not present

## 2015-06-03 DIAGNOSIS — E119 Type 2 diabetes mellitus without complications: Secondary | ICD-10-CM | POA: Diagnosis not present

## 2015-06-03 LAB — GLUCOSE, POCT (MANUAL RESULT ENTRY): POC Glucose: 153 mg/dl — AB (ref 70–99)

## 2015-06-03 MED ORDER — AZELASTINE HCL 0.1 % NA SOLN: 2.0000 | Freq: Two times a day (BID) | NASAL | Status: DC

## 2015-06-03 MED ORDER — CETIRIZINE HCL 10 MG PO TABS: 10.0000 mg | ORAL_TABLET | Freq: Every day | ORAL | Status: DC

## 2015-06-03 MED ORDER — PREDNISONE 10 MG PO TABS: ORAL_TABLET | ORAL | Status: DC

## 2015-06-03 MED ORDER — FLUTICASONE PROPIONATE 50 MCG/ACT NA SUSP: 2.0000 | Freq: Every day | NASAL | Status: DC

## 2015-06-03 NOTE — Progress Notes (Addendum)
Patient ID: Karen [name], female   DOB: 11/20/1960, 55 y.o.   MRN: [id number]     By signing my name below, I, Zola Button, attest that this documentation has been prepared under the direction and in the presence of Arlyss Queen, MD.  Electronically Signed: Zola Button, Medical Scribe. 06/03/2015. 8:29 AM.   Chief Complaint:  Chief Complaint  Patient presents with  . Nasal Congestion    x 6 days     HPI: Karen [name] is a 55 y.o. female with a history of DM and HTN who reports to [facility] today complaining of gradual onset, intermittent nasal congestion that started 6 days ago. Patient reports having associated ear fullness, sinus pressure, and headache. She went to the ED last night, but she was not seen because she did not want to wait. Her symptoms are worse at night. She has not tried any nasal sprays. Patient denies sore throat, cough, and feeling ill. She also denies possibility of pregnancy. She had a hysterectomy at age 55. Patient has not received the flu shot this year. She notes she has not had any recent problems with her blood pressure or blood sugar. Patient states she cleaned out and of an 2 nights ago and yesterday at the group home she spent 2 hours cleaning with times all and other cleaning agents.   Patient works at a group home.  Past Medical History  Diagnosis Date  . Diabetes mellitus without complication ([place])   . Hypertension   . Allergy    Past Surgical History  Procedure Laterality Date  . Appendectomy    . Abdominal hysterectomy      PID; ovaries resected.  Age 89.   Social History   Social History  . Marital Status: Divorced    Spouse Name: N/A  . Number of Children: N/A  . Years of Education: N/A   Social History Main Topics  . Smoking status: Former Smoker -- 0.70 packs/day for 30 years    Types: Cigarettes  . Smokeless tobacco: Never Used  . Alcohol Use: No  . Drug Use: No  . Sexual Activity: No   Other Topics Concern  . None    Social History Narrative   Marital status: divorced; not dating      Children:  None      Lives: alone      Employment: Radio broadcast assistant       Tobacco: sometimes; smokes 1 pack per week       Alcohol:  None      Drugs: none      Exercise:  sporadic   Family History  Problem Relation Age of Onset  . Cancer Mother 76    uterine cancer  . Heart disease Sister 50    AMI/CAD with stenting  . Hypertension Sister    Allergies  Allergen Reactions  . Orange Fruit [Citrus] Hives  . Penicillins Hives   Prior to Admission medications   Medication Sig Start Date End Date Taking? Authorizing Provider  glyBURIDE (DIABETA) 5 MG tablet Take 1 tablet (5 mg total) by mouth daily with breakfast. 11/26/14  Yes Wardell Honour, MD  losartan-hydrochlorothiazide (HYZAAR) 50-12.5 MG per tablet Take 1 tablet by mouth daily. 11/26/14  Yes Wardell Honour, MD     ROS: The patient denies fevers, chills, night sweats, unintentional weight loss, chest pain, palpitations, wheezing, dyspnea on exertion, nausea, vomiting, abdominal pain, dysuria, hematuria, melena, numbness, weakness, or tingling.   All other systems have  been reviewed and were otherwise negative with the exception of those mentioned in the HPI and as above.    PHYSICAL EXAM: Filed Vitals:   06/03/15 0814  BP: 118/76  Pulse: 92  Temp: 98.5 F (36.9 C)  Resp: 16   Body mass index is 35.78 kg/(m^2).   General: Alert, no acute distress HEENT:  Normocephalic, atraumatic, oropharynx patent. Wax in right external auditory canal. Turbinates are exteremly swollen. Eye: [facility] Cardiovascular:  Regular rate and rhythm, no rubs murmurs or gallops.  No Carotid bruits, radial pulse intact. No pedal edema.  Respiratory: Clear to auscultation bilaterally.  No wheezes, rales, or rhonchi.  No cyanosis, no use of accessory musculature Abdominal: No organomegaly, abdomen is soft and non-tender, positive bowel sounds.  No  masses. Musculoskeletal: Gait intact. No edema, tenderness Skin: No rashes. Neurologic: Facial musculature symmetric. Psychiatric: Patient acts appropriately throughout our interaction. Lymphatic: No cervical or submandibular lymphadenopathy    LABS: Results for orders placed or performed in visit on 06/03/15  POCT glucose (manual entry)  Result Value Ref Range   POC Glucose 153 (A) 70 - 99 mg/dl     EKG/XRAY:   Primary read interpreted by Dr. Everlene Farrier at [facility]. There is mucosal thickening noted no air-fluid levels   ASSESSMENT/PLAN: We'll treat with combination of Astepro nasal spray along with Flonase. I will give her a very short taper of prednisone. No evidence of sinusitis seen on her films.I personally performed the services described in this documentation, which was scribed in my presence. The recorded information has been reviewed and is accurate. Blood sugar checked was 150 in the office. She was exposed to different cleaning fluids yesterday and I believe this was the trigger for her symptoms.    Gross sideeffects, risk and benefits, and alternatives of medications d/w patient. Patient is aware that all medications have potential sideeffects and we are unable to predict every sideeffect or drug-drug interaction that may occur.  Arlyss Queen MD 06/03/2015 8:29 AM

## 2015-06-03 NOTE — Patient Instructions (Signed)
Allergic Rhinitis Allergic rhinitis is when the mucous membranes in the nose respond to allergens. Allergens are particles in the air that cause your body to have an allergic reaction. This causes you to release allergic antibodies. Through a chain of events, these eventually cause you to release histamine into the blood stream. Although meant to protect the body, it is this release of histamine that causes your discomfort, such as frequent sneezing, congestion, and an itchy, runny nose.  CAUSES Seasonal allergic rhinitis (hay fever) is caused by pollen allergens that may come from grasses, trees, and weeds. Year-round allergic rhinitis (perennial allergic rhinitis) is caused by allergens such as house dust mites, pet dander, and mold spores. SYMPTOMS  Nasal stuffiness (congestion).  Itchy, runny nose with sneezing and tearing of the eyes. DIAGNOSIS Your health care provider can help you determine the allergen or allergens that trigger your symptoms. If you and your health care provider are unable to determine the allergen, skin or blood testing may be used. Your health care provider will diagnose your condition after taking your health history and performing a physical exam. Your health care provider may assess you for other related conditions, such as asthma, pink eye, or an ear infection. TREATMENT Allergic rhinitis does not have a cure, but it can be controlled by:  Medicines that block allergy symptoms. These may include allergy shots, nasal sprays, and oral antihistamines.  Avoiding the allergen. Hay fever may often be treated with antihistamines in pill or nasal spray forms. Antihistamines block the effects of histamine. There are over-the-counter medicines that may help with nasal congestion and swelling around the eyes. Check with your health care provider before taking or giving this medicine. If avoiding the allergen or the medicine prescribed do not work, there are many new medicines  your health care provider can prescribe. Stronger medicine may be used if initial measures are ineffective. Desensitizing injections can be used if medicine and avoidance does not work. Desensitization is when a patient is given ongoing shots until the body becomes less sensitive to the allergen. Make sure you follow up with your health care provider if problems continue. HOME CARE INSTRUCTIONS It is not possible to completely avoid allergens, but you can reduce your symptoms by taking steps to limit your exposure to them. It helps to know exactly what you are allergic to so that you can avoid your specific triggers. SEEK MEDICAL CARE IF:  You have a fever.  You develop a cough that does not stop easily (persistent).  You have shortness of breath.  You start wheezing.  Symptoms interfere with normal daily activities.   This information is not intended to replace advice given to you by your health care provider. Make sure you discuss any questions you have with your health care provider.   Document Released: 01/30/2001 Document Revised: 05/28/2014 Document Reviewed: 01/12/2013 Elsevier Interactive Patient Education 2016 [employer]vier Inc.  

## 2015-06-06 ENCOUNTER — Emergency Department (HOSPITAL_COMMUNITY)
Admission: EM | Admit: 2015-06-06 | Discharge: 2015-06-06 | Disposition: A | Discharge status: Home / Self Care | Payer: BLUE CROSS/BLUE SHIELD | Attending: Emergency Medicine | Admitting: Emergency Medicine

## 2015-06-06 ENCOUNTER — Encounter (HOSPITAL_COMMUNITY): Payer: Self-pay | Admitting: Family Medicine

## 2015-06-06 DIAGNOSIS — Z79899 Other long term (current) drug therapy: Secondary | ICD-10-CM | POA: Insufficient documentation

## 2015-06-06 DIAGNOSIS — Z7952 Long term (current) use of systemic steroids: Secondary | ICD-10-CM | POA: Insufficient documentation

## 2015-06-06 DIAGNOSIS — Z880 Allergy status to penicillin: Secondary | ICD-10-CM | POA: Diagnosis not present

## 2015-06-06 DIAGNOSIS — E119 Type 2 diabetes mellitus without complications: Secondary | ICD-10-CM | POA: Diagnosis not present

## 2015-06-06 DIAGNOSIS — Z7951 Long term (current) use of inhaled steroids: Secondary | ICD-10-CM | POA: Diagnosis not present

## 2015-06-06 DIAGNOSIS — I10 Essential (primary) hypertension: Secondary | ICD-10-CM | POA: Insufficient documentation

## 2015-06-06 DIAGNOSIS — Z7984 Long term (current) use of oral hypoglycemic drugs: Secondary | ICD-10-CM | POA: Diagnosis not present

## 2015-06-06 DIAGNOSIS — Z87891 Personal history of nicotine dependence: Secondary | ICD-10-CM | POA: Insufficient documentation

## 2015-06-06 DIAGNOSIS — R21 Rash and other nonspecific skin eruption: Secondary | ICD-10-CM

## 2015-06-06 MED ORDER — HYDROCORTISONE 1 % EX CREA: TOPICAL_CREAM | CUTANEOUS | Status: DC

## 2015-06-06 MED ORDER — HYDROCORTISONE 1 % EX CREA
TOPICAL_CREAM | Freq: Once | CUTANEOUS | Status: AC
  Administered 2015-06-06: 1 via TOPICAL
  Filled 2015-06-06: qty 28

## 2015-06-06 NOTE — ED Provider Notes (Signed)
CSN: [id number]     Arrival date & time 06/06/15  [id number] History   First MD Initiated Contact with Patient 06/06/15 [phone]     Chief Complaint  Patient presents with  . Rash     (Consider location/radiation/quality/duration/timing/severity/associated sxs/prior Treatment) HPI   Karen [name] is a 55 y.o. female, with a history of diabetes, presenting to the ED with a small rash just under her right nostril. Pt states she was seen at [facility] three days ago for an "allergic reaction" to some kind of cleaning supplies, was given prednisone, Zyrtec, and two nasal sprays, Flonase and Astepro 0.1%. Pt states that when she used her Astepro Saturday night she began to have irritation to the skin under her nostrils. The skin under the left nostril stopped hurting within a few minutes, but the skin under the right nostril developed a "bumpy rash." Pt states the rash has not spread, she has never had a rash on her face before, and she hasn't tried anything for it. Pt denies recent illness, N/V, fever/chills, difficulty breathing or swallowing, rashes anywhere else, or any other complaints.     Past Medical History  Diagnosis Date  . Diabetes mellitus without complication ([place])   . Hypertension   . Allergy    Past Surgical History  Procedure Laterality Date  . Appendectomy    . Abdominal hysterectomy      PID; ovaries resected.  Age 26.   Family History  Problem Relation Age of Onset  . Cancer Mother 75    uterine cancer  . Heart disease Sister 57    AMI/CAD with stenting  . Hypertension Sister    Social History  Substance Use Topics  . Smoking status: Former Smoker -- 0.70 packs/day for 30 years    Types: Cigarettes  . Smokeless tobacco: Never Used  . Alcohol Use: No   OB History    No data available     Review of Systems  Constitutional: Negative for fever and chills.  Respiratory: Negative for cough and shortness of breath.   Cardiovascular: Negative for chest pain.   Gastrointestinal: Negative for nausea and vomiting.  Skin: Positive for rash.  All other systems reviewed and are negative.     Allergies  Orange fruit and Penicillins  Home Medications   Prior to Admission medications   Medication Sig Start Date End Date Taking? Authorizing Provider  azelastine (ASTELIN) 0.1 % nasal spray Place 2 sprays into both nostrils 2 (two) times daily. Use in each nostril as directed 06/03/15   Darlyne Russian, MD  cetirizine (ZYRTEC) 10 MG tablet Take 1 tablet (10 mg total) by mouth daily. 06/03/15   Darlyne Russian, MD  fluticasone (FLONASE) 50 MCG/ACT nasal spray Place 2 sprays into both nostrils daily. 06/03/15   Darlyne Russian, MD  glyBURIDE (DIABETA) 5 MG tablet Take 1 tablet (5 mg total) by mouth daily with breakfast. 11/26/14   Wardell Honour, MD  hydrocortisone cream 1 % Apply to affected area 2 times daily 06/06/15   Shawn C Joy, PA-C  losartan-hydrochlorothiazide (HYZAAR) 50-12.5 MG per tablet Take 1 tablet by mouth daily. 11/26/14   Wardell Honour, MD  predniSONE (DELTASONE) 10 MG tablet Take 2 a day for 3 days then one a day for 3 days. 06/03/15   Darlyne Russian, MD   BP 140/76 mmHg  Pulse 81  Temp(Src) 98.3 F (36.8 C) (Oral)  Resp 20  Ht 5\' 4"  (1.626 m)  Wt 96.616  kg  BMI 36.54 kg/m2  SpO2 100% Physical Exam  Constitutional: She appears well-developed and well-nourished. No distress.  HENT:  Head: Normocephalic and atraumatic.  Eyes: Conjunctivae are normal. Pupils are equal, round, and reactive to light.  Neck: Normal range of motion. Neck supple.  Cardiovascular: Normal rate and regular rhythm.   Pulmonary/Chest: Effort normal. No respiratory distress.  Lymphadenopathy:    She has no cervical adenopathy.  Neurological: She is alert.  Skin: Skin is warm and dry. She is not diaphoretic.  Small, dime-sized area of vesicular rash noted on the upper lip, just under the right nare. No surrounding erythema or edema. No discharge.  Nursing note and  vitals reviewed.   ED Course  Procedures (including critical care time)   MDM   Final diagnoses:  Rash    Karen [name] presents with a small rash to her right upper lip for the last 3 days.  Suspect this patient's presentation is consistent with either mild local drug reaction versus herpes simplex outbreak. Patient meets no criteria for labs at this time. There are no signs of a systemic issue. Chart review reveals that the patient was actually treated for nasal congestion 3 days ago at urgent care rather than an actual allergic reaction. Patient adds during the interview that she "has very sensitive skin and tends to get rashes without much provocation." The patient will be tried on a course of topical hydrocortisone cream. She was instructed to follow-up with either urgent care or her PCP if symptoms do not improve.  Lorayne Bender, PA-C 06/06/15 0813  Daleen Bo, MD 06/06/15 [phone]

## 2015-06-06 NOTE — ED Notes (Signed)
Patient reports she went to Urgent Care on Friday. Dx with allergic reaction from cleaining supplies. Pt was placed on Prednisone, Zyrtec, and two different nasal spray. After using spray once of Azelastine 0.1%, pt developed a rash underneath right nostril. Burns but no itching.

## 2015-06-06 NOTE — Discharge Instructions (Signed)
You have been seen today for a rash. Follow up with PCP as needed. Return to ED should symptoms worsen. Use the hydrocortisone cream as directed. Follow up with Dr. Everlene Farrier or your PCP if symptoms do not improve.

## 2015-06-09 ENCOUNTER — Telehealth: Payer: Self-pay

## 2015-07-29 ENCOUNTER — Encounter: Payer: BLUE CROSS/BLUE SHIELD | Admitting: Family Medicine

## 2015-07-29 ENCOUNTER — Telehealth: Payer: Self-pay | Admitting: Family Medicine

## 2015-07-29 ENCOUNTER — Encounter: Payer: Self-pay | Admitting: Gastroenterology

## 2015-07-29 DIAGNOSIS — Z1211 Encounter for screening for malignant neoplasm of colon: Secondary | ICD-10-CM

## 2015-07-29 NOTE — Telephone Encounter (Signed)
Spoke with patient and we reordered the colonoscopy.  Made sure the phone number was correct in the chart so that the referral could be made with the  GI this time.   She is going to the eye doctor today and will have them fax [place] a copy of the report.

## 2015-08-09 ENCOUNTER — Encounter: Payer: BLUE CROSS/BLUE SHIELD | Admitting: Family Medicine

## 2015-08-10 ENCOUNTER — Ambulatory Visit: Payer: BLUE CROSS/BLUE SHIELD

## 2015-09-19 ENCOUNTER — Encounter: Payer: Self-pay | Admitting: Gastroenterology

## 2016-02-20 ENCOUNTER — Other Ambulatory Visit: Payer: Self-pay | Admitting: Family Medicine

## 2016-08-11 IMAGING — CR DG THORACIC SPINE 2V
3 series · 3 of 3 positions shown · non-contrast
Comparison: Chest radiograph December 03, 2013

CLINICAL DATA: Left-sided dorsalgia for 1 day

EXAM:
THORACIC SPINE - 2-3 VIEWS

[AP]
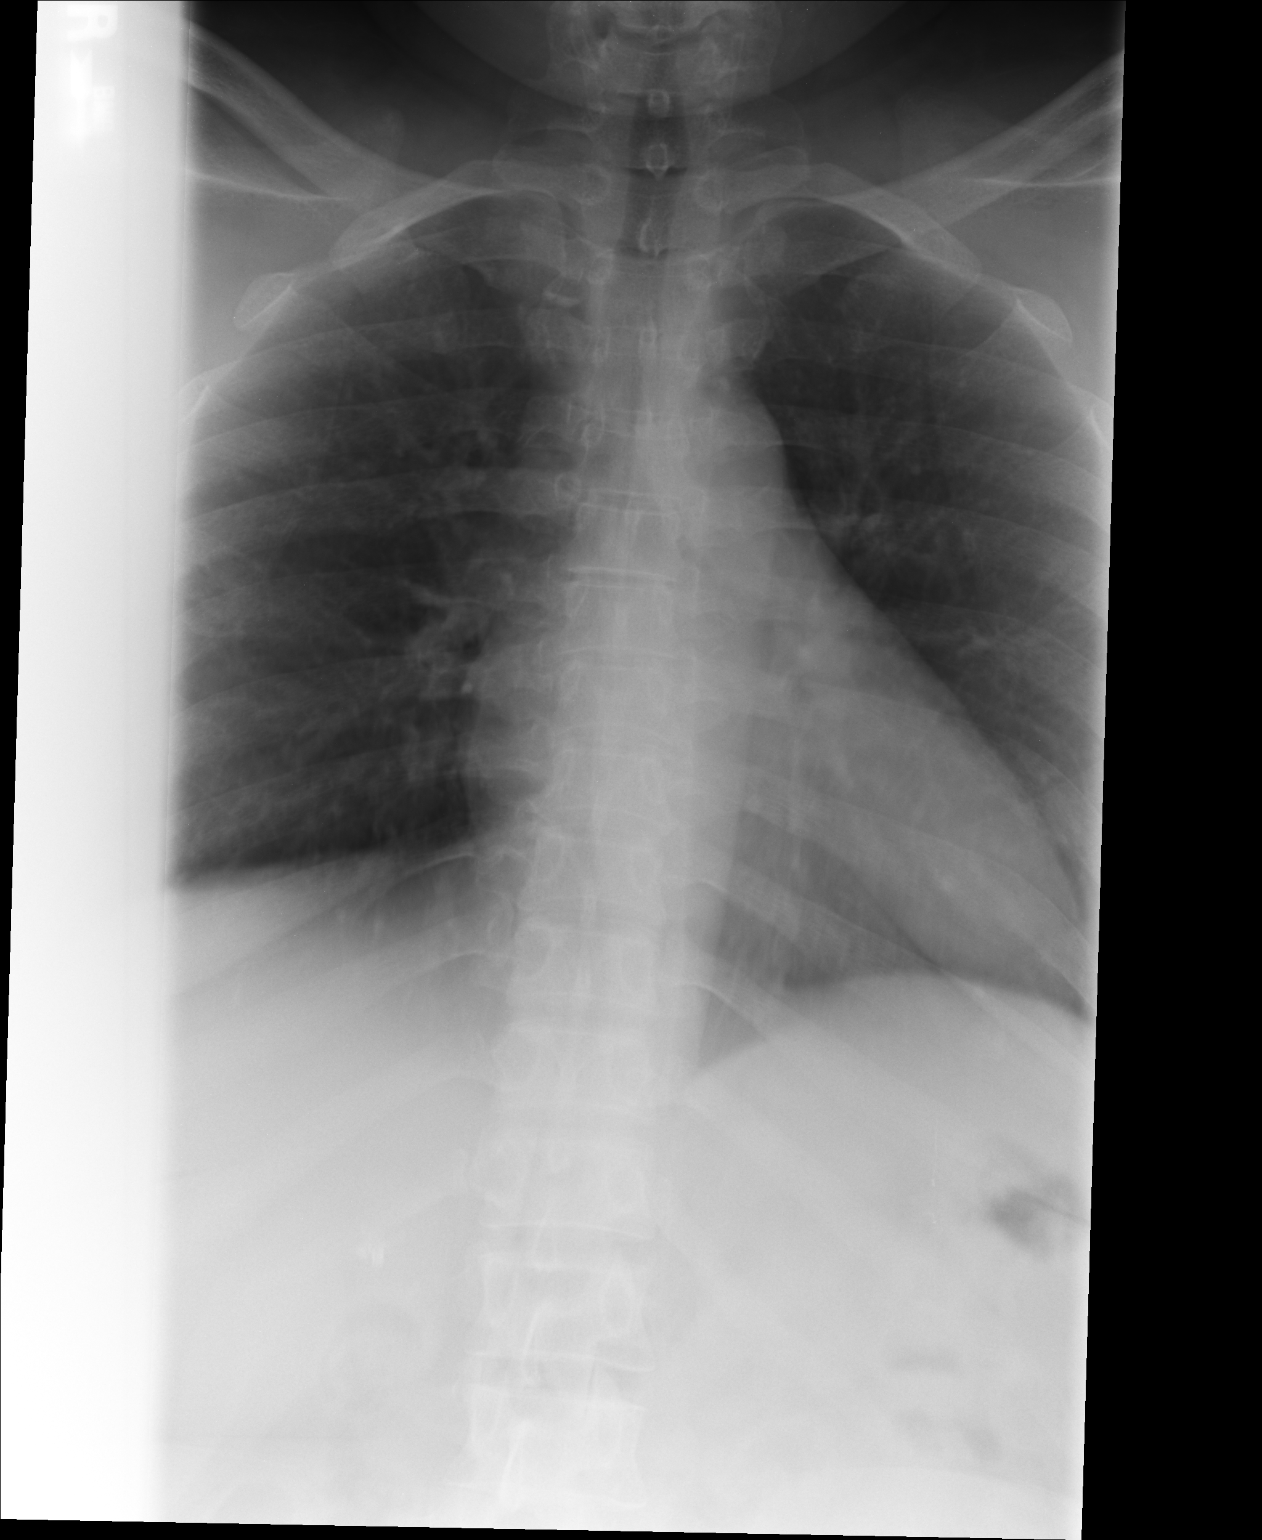

[lateral]
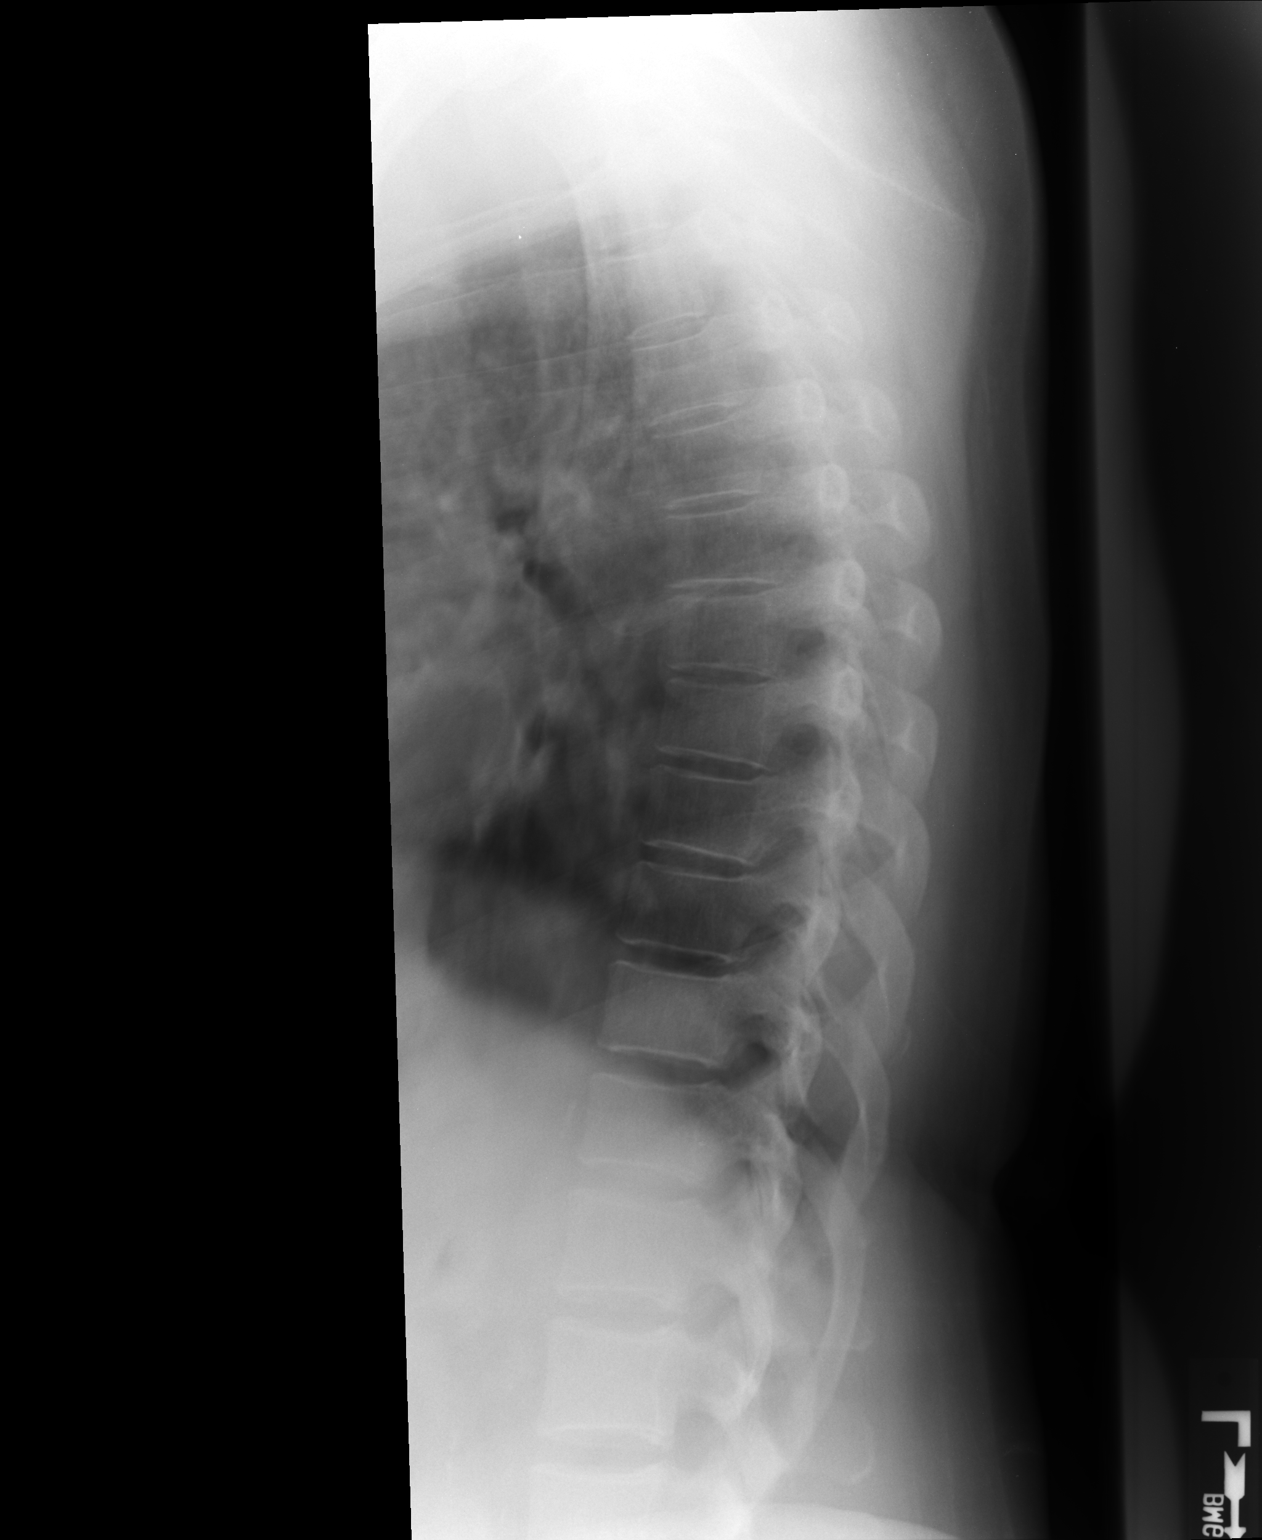

[swimmers]
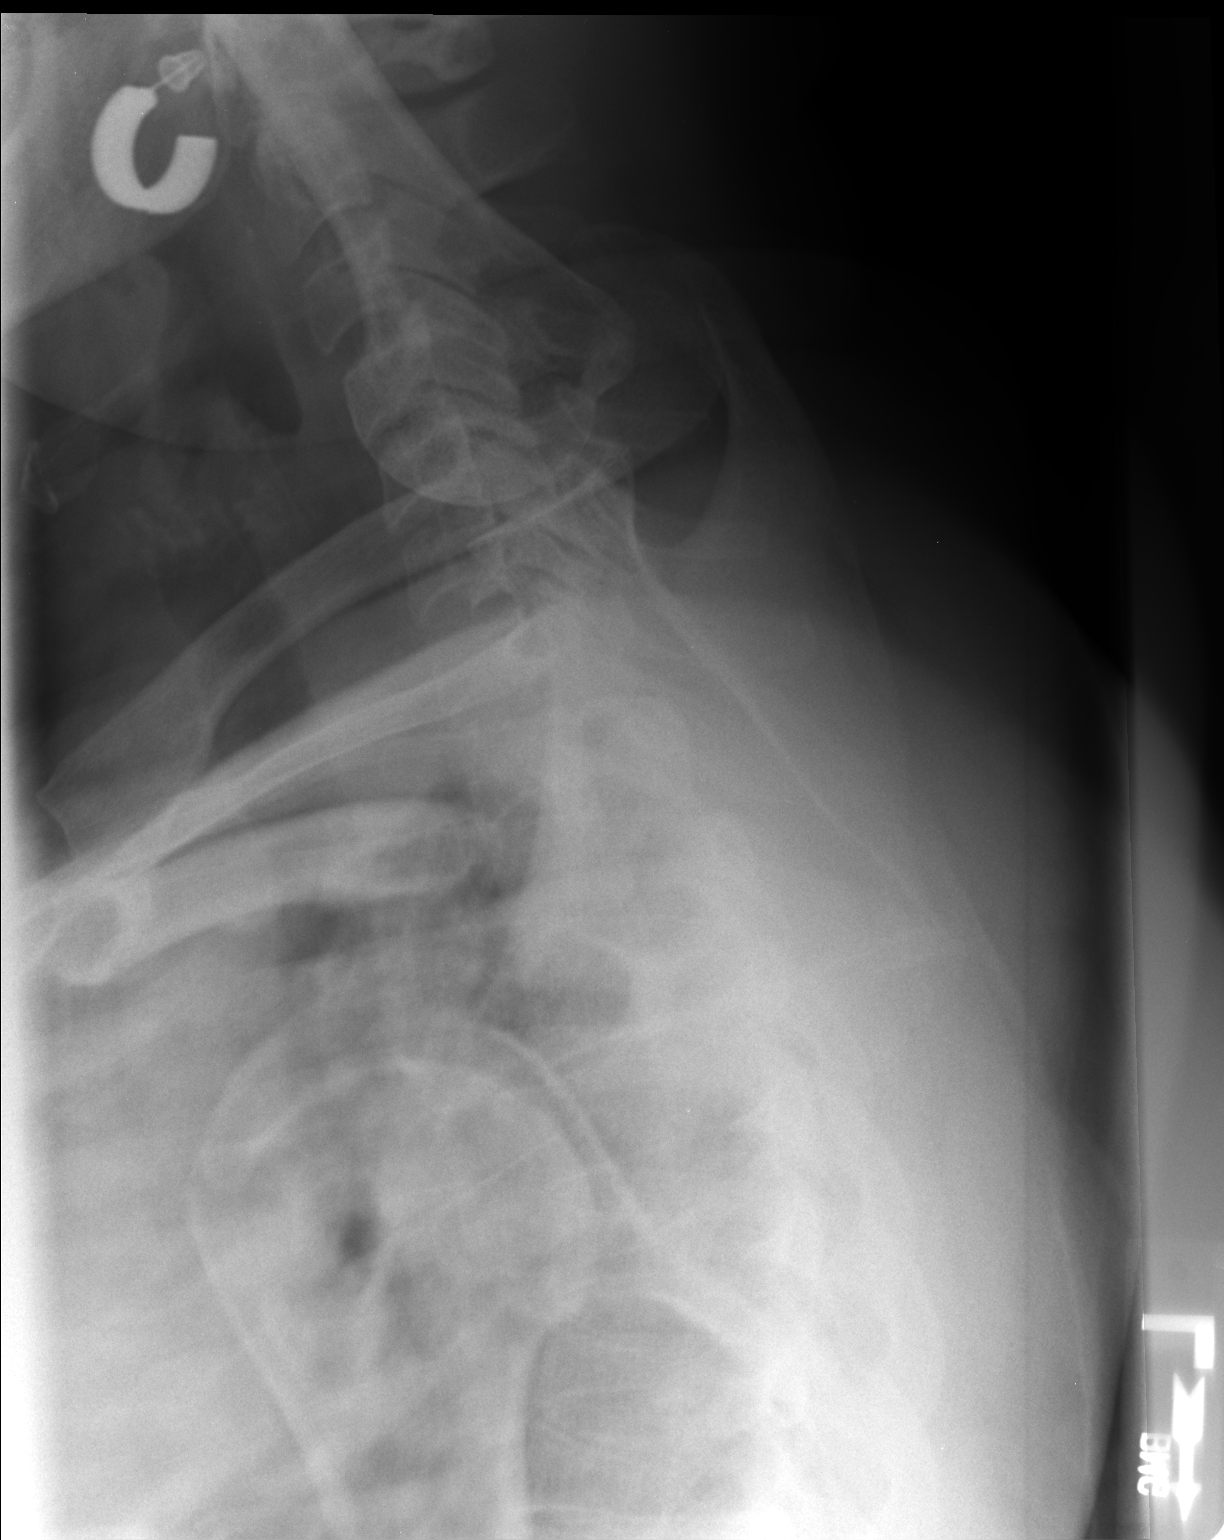

[3 of 3 positions shown; findings below may reference images not displayed]

FINDINGS: Frontal, lateral, and swimmer's views were obtained. There is no
fracture or spondylolisthesis. Disc spaces appear intact. No erosive
change.
IMPRESSION: No fracture or spondylolisthesis.  No appreciable arthropathy.

## 2017-04-13 ENCOUNTER — Encounter: Payer: Self-pay | Admitting: Family Medicine

## 2017-04-13 ENCOUNTER — Ambulatory Visit: Payer: BLUE CROSS/BLUE SHIELD | Admitting: Family Medicine

## 2017-04-13 VITALS — BP 126/74 | HR 84 | Temp 98.4°F | Resp 16 | Ht 63.0 in | Wt 216.0 lb

## 2017-04-13 DIAGNOSIS — T7840XA Allergy, unspecified, initial encounter: Secondary | ICD-10-CM | POA: Diagnosis not present

## 2017-04-13 MED ORDER — PREDNISONE 20 MG PO TABS: 60.0000 mg | ORAL_TABLET | Freq: Every day | ORAL | 0 refills | Status: DC

## 2017-04-13 MED ORDER — DIPHENHYDRAMINE HCL 25 MG PO CAPS: 25.0000 mg | ORAL_CAPSULE | Freq: Four times a day (QID) | ORAL | 0 refills | Status: DC | PRN

## 2017-04-13 NOTE — Progress Notes (Signed)
11/24/20189:38 AM  Karen [name] 06-Dec-1960, 56 y.o. female [id number]  Chief Complaint  Patient presents with  . Allergic Reaction    Rash on neck from hair dye    HPI:   Patient is a 56 y.o. female with past medical history significant for DM2 and HTN who presents today for allergic reaction after testing hair line behind right ear for hair dye given that she has had significant allergic reactions to hair dyes before. She states that patch test was done about 3 days ago and it has slowly been getting more swollen until last night it actually became painful as she could not find a comfortable position to sleep. She denies any SOB, swelling of lips or throat, nausea or vomiting. She has not seen a doctor for her chronic medical conditions in quite a while.   Depression screen [facility] 2/9 04/13/2017 06/03/2015 02/09/2015  Decreased Interest 0 0 0  Down, Depressed, Hopeless 0 0 0  PHQ - 2 Score 0 0 0  Altered sleeping - - -  Tired, decreased energy - - -  Change in appetite - - -  Feeling bad or failure about yourself  - - -  Trouble concentrating - - -  Moving slowly or fidgety/restless - - -  Suicidal thoughts - - -  PHQ-9 Score - - -  Difficult doing work/chores - - -    Allergies  Allergen Reactions  . Orange Fruit [Citrus] Hives  . Penicillins Hives    Prior to Admission medications   Medication Sig Start Date End Date Taking? Authorizing Provider  amLODipine (NORVASC) 5 MG tablet Take 5 mg by mouth daily.   Yes [provider]  cetirizine (ZYRTEC) 10 MG tablet Take 1 tablet (10 mg total) by mouth daily. 06/03/15  Yes Daub, Loura Back, MD  Dexlansoprazole (DEXILANT PO) Take by mouth.   Yes [provider]  glyBURIDE (DIABETA) 5 MG tablet Take 1 tablet (5 mg total) by mouth daily with breakfast. 11/26/14  Yes Wardell Honour, MD  fluticasone [facility]) 50 MCG/ACT nasal spray Place 2 sprays into both nostrils daily. Patient not taking: Reported on 04/13/2017  06/03/15   Darlyne Russian, MD  hydrocortisone cream 1 % Apply to affected area 2 times daily Patient not taking: Reported on 04/13/2017 06/06/15   Lorayne Bender, PA-C    Past Medical History:  Diagnosis Date  . Allergy   . Diabetes mellitus without complication ([place])   . Hypertension     Past Surgical History:  Procedure Laterality Date  . ABDOMINAL HYSTERECTOMY     PID; ovaries resected.  Age 82.  Marland Kitchen APPENDECTOMY      Social History   Tobacco Use  . Smoking status: Former Smoker    Packs/day: 0.70    Years: 30.00    Pack years: 21.00    Types: Cigarettes  . Smokeless tobacco: Never Used  Substance Use Topics  . Alcohol use: No    Family History  Problem Relation Age of Onset  . Cancer Mother 39       uterine cancer  . Heart disease Sister 37       AMI/CAD with stenting  . Hypertension Sister     ROS Per HPI  OBJECTIVE:  Blood pressure 126/74, pulse 84, temperature 98.4 F (36.9 C), temperature source Oral, resp. rate 16, height 5\' 3"  (1.6 m), weight 216 lb (98 kg), SpO2 99 %.  Physical Exam  Constitutional: She is oriented to person,  place, and time and well-developed, well-nourished, and in no distress.  HENT:  Head: Normocephalic and atraumatic.  Mouth/Throat: Oropharynx is clear and moist. No oropharyngeal exudate.  Eyes: EOM are normal. Pupils are equal, round, and reactive to light. No scleral icterus.  Neck: Neck supple.  Cardiovascular: Normal rate, regular rhythm and normal heart sounds. Exam reveals no gallop and no friction rub.  No murmur heard. Pulmonary/Chest: Effort normal and breath sounds normal. She has no wheezes. She has no rales.  Musculoskeletal: She exhibits no edema.  Neurological: She is alert and oriented to person, place, and time. Gait normal.  Skin: Skin is warm and dry. There is erythema (right post auricular area with large welt).     ASSESSMENT and PLAN  1. Allergic reaction, initial encounter Discussed supportive  measures, new meds r/se/b and RTC precautions. Patient educational handout given. - predniSONE (DELTASONE) 20 MG tablet; Take 3 tablets (60 mg total) by mouth daily with breakfast. - diphenhydrAMINE (BENADRYL) 25 mg capsule; Take 1 capsule (25 mg total) by mouth every 6 (six) hours as needed.  Return for establish care.    Rutherford Guys, MD [facility] [place], [place] [address] Ph.  [phone] Fax [phone]

## 2017-04-13 NOTE — Patient Instructions (Addendum)
     IF you received an x-ray today, you will receive an invoice from [facility]. Please contact [facility] at [phone] with questions or concerns regarding your invoice.   IF you received labwork today, you will receive an invoice from [facility]. Please contact [facility] at [phone] with questions or concerns regarding your invoice.   Our billing staff will not be able to assist you with questions regarding bills from these companies.  You will be contacted with the lab results as soon as they are available. The fastest way to get your results is to activate your My Chart account. Instructions are located on the last page of this paperwork. If you have not heard from us regarding the results in 2 weeks, please contact this office.     Contact Dermatitis Dermatitis is redness, soreness, and swelling (inflammation) of the skin. Contact dermatitis is a reaction to certain substances that touch the skin. You either touched something that irritated your skin, or you have allergies to something you touched. Follow these instructions at home: Skin Care  Moisturize your skin as needed.  Apply cool compresses to the affected areas.  Try taking a bath with: ? Epsom salts. Follow the instructions on the package. You can get these at a pharmacy or grocery store. ? Baking soda. Pour a small amount into the bath as told by your doctor. ? Colloidal oatmeal. Follow the instructions on the package. You can get this at a pharmacy or grocery store.  Try applying baking soda paste to your skin. Stir water into baking soda until it looks like paste.  Do not scratch your skin.  Bathe less often.  Bathe in lukewarm water. Avoid using hot water. Medicines  Take or apply over-the-counter and prescription medicines only as told by your doctor.  If you were prescribed an antibiotic medicine, take or apply your antibiotic as told by your doctor. Do not stop taking the antibiotic  even if your condition starts to get better. General instructions  Keep all follow-up visits as told by your doctor. This is important.  Avoid the substance that caused your reaction. If you do not know what caused it, keep a journal to try to track what caused it. Write down: ? What you eat. ? What cosmetic products you use. ? What you drink. ? What you wear in the affected area. This includes jewelry.  If you were given a bandage (dressing), take care of it as told by your doctor. This includes when to change and remove it. Contact a doctor if:  You do not get better with treatment.  Your condition gets worse.  You have signs of infection such as: ? Swelling. ? Tenderness. ? Redness. ? Soreness. ? Warmth.  You have a fever.  You have new symptoms. Get help right away if:  You have a very bad headache.  You have neck pain.  Your neck is stiff.  You throw up (vomit).  You feel very sleepy.  You see red streaks coming from the affected area.  Your bone or joint underneath the affected area becomes painful after the skin has healed.  The affected area turns darker.  You have trouble breathing. This information is not intended to replace advice given to you by your health care provider. Make sure you discuss any questions you have with your health care provider. Document Released: 03/04/2009 Document Revised: 10/13/2015 Document Reviewed: 09/22/2014 Elsevier Interactive Patient Education  2018 [employer].  

## 2017-04-19 ENCOUNTER — Ambulatory Visit: Payer: BLUE CROSS/BLUE SHIELD | Admitting: Family Medicine

## 2017-04-19 ENCOUNTER — Other Ambulatory Visit: Payer: Self-pay

## 2017-04-19 ENCOUNTER — Encounter: Payer: Self-pay | Admitting: Family Medicine

## 2017-04-19 VITALS — BP 136/82 | HR 90 | Temp 98.3°F | Resp 18 | Ht 64.76 in | Wt 216.2 lb

## 2017-04-19 DIAGNOSIS — E119 Type 2 diabetes mellitus without complications: Secondary | ICD-10-CM

## 2017-04-19 DIAGNOSIS — R350 Frequency of micturition: Secondary | ICD-10-CM

## 2017-04-19 DIAGNOSIS — I10 Essential (primary) hypertension: Secondary | ICD-10-CM | POA: Diagnosis not present

## 2017-04-19 DIAGNOSIS — K219 Gastro-esophageal reflux disease without esophagitis: Secondary | ICD-10-CM | POA: Insufficient documentation

## 2017-04-19 DIAGNOSIS — J302 Other seasonal allergic rhinitis: Secondary | ICD-10-CM | POA: Insufficient documentation

## 2017-04-19 DIAGNOSIS — N898 Other specified noninflammatory disorders of vagina: Secondary | ICD-10-CM

## 2017-04-19 LAB — POCT WET + KOH PREP
Trich by wet prep: ABSENT
Yeast by KOH: ABSENT
Yeast by wet prep: ABSENT

## 2017-04-19 LAB — POCT URINALYSIS DIP (MANUAL ENTRY)
Bilirubin, UA: NEGATIVE
Glucose, UA: 500 mg/dL — AB
Ketones, POC UA: NEGATIVE mg/dL
Leukocytes, UA: NEGATIVE
Nitrite, UA: NEGATIVE
Spec Grav, UA: 1.025 (ref 1.010–1.025)
Urobilinogen, UA: 0.2 E.U./dL
pH, UA: 5.5 (ref 5.0–8.0)

## 2017-04-19 LAB — GLUCOSE, POCT (MANUAL RESULT ENTRY): POC Glucose: 232 mg/dl — AB (ref 70–99)

## 2017-04-19 MED ORDER — AZELASTINE HCL 0.1 % NA SOLN: 2.0000 | Freq: Two times a day (BID) | NASAL | 2 refills | Status: DC

## 2017-04-19 MED ORDER — DEXLANSOPRAZOLE 30 MG PO CPDR: 30.0000 mg | DELAYED_RELEASE_CAPSULE | Freq: Every day | ORAL | 2 refills | Status: DC

## 2017-04-19 MED ORDER — FLUTICASONE PROPIONATE 50 MCG/ACT NA SUSP: 2.0000 | Freq: Every day | NASAL | 6 refills | Status: DC

## 2017-04-19 MED ORDER — CETIRIZINE HCL 10 MG PO TABS: 10.0000 mg | ORAL_TABLET | Freq: Every day | ORAL | 11 refills | Status: DC

## 2017-04-19 MED ORDER — GLYBURIDE 5 MG PO TABS: 5.0000 mg | ORAL_TABLET | Freq: Every day | ORAL | 5 refills | Status: DC

## 2017-04-19 NOTE — Progress Notes (Signed)
11/30/201810:07 AM  Karen [name] March 30, 1961, 56 y.o. female [id number]  Chief Complaint  Patient presents with  . Sinus Problem    X 1 day  . Urinary Frequency    X 2 days  . Medication Refill    Cetirzine   . Vaginal Itching    X 2 days  . Abdominal Pain    X 2 days    HPI:   Patient is a 56 y.o. female with past medical history significant for allergic rhinitis, DM2 and HTN, noncompliant, has not taken meds for years, recently treated with short course of oral prednisone who presents today with several concerns.  Yesterday started having nasal congestion, sinus burning, PND, cough, that started last night. Takes cetrizine every day. Last year ended getting a sinus infection, she does not want it to get so bad, so is requesting refills of nasal sprays she used to be on in the past.   She is also c/o vaginal irritation, lower abd pain and urinary frequency for past 2 days. Symptoms started after completion of oral steroids. She denies any hematuria, fever, chills, nausea, vomiting.   Depression screen [facility] 2/9 04/19/2017 04/13/2017 06/03/2015  Decreased Interest 0 0 0  Down, Depressed, Hopeless 0 0 0  PHQ - 2 Score 0 0 0  Altered sleeping - - -  Tired, decreased energy - - -  Change in appetite - - -  Feeling bad or failure about yourself  - - -  Trouble concentrating - - -  Moving slowly or fidgety/restless - - -  Suicidal thoughts - - -  PHQ-9 Score - - -  Difficult doing work/chores - - -    Allergies  Allergen Reactions  . Orange Fruit [Citrus] Hives  . Penicillins Hives    Prior to Admission medications   Medication Sig Start Date End Date Taking? Authorizing Provider  amLODipine (NORVASC) 5 MG tablet Take 5 mg by mouth daily.   Yes [provider]  cetirizine (ZYRTEC) 10 MG tablet Take 1 tablet (10 mg total) by mouth daily. 06/03/15  Yes Darlyne Russian, MD  diphenhydrAMINE (BENADRYL) 25 mg capsule Take 1 capsule (25 mg total) by mouth every 6  (six) hours as needed. 04/13/17  Yes Rutherford Guys, MD  glyBURIDE (DIABETA) 5 MG tablet Take 1 tablet (5 mg total) by mouth daily with breakfast. 11/26/14  Yes Wardell Honour, MD  Dexlansoprazole (DEXILANT PO) Take by mouth.    [provider]  fluticasone (FLONASE) 50 MCG/ACT nasal spray Place 2 sprays into both nostrils daily. Patient not taking: Reported on 04/13/2017 06/03/15   Darlyne Russian, MD  hydrocortisone cream 1 % Apply to affected area 2 times daily Patient not taking: Reported on 04/13/2017 06/06/15   Lorayne Bender, PA-C  predniSONE (DELTASONE) 20 MG tablet Take 3 tablets (60 mg total) by mouth daily with breakfast. Patient not taking: Reported on 04/19/2017 04/13/17   Rutherford Guys, MD    Past Medical History:  Diagnosis Date  . Allergy   . Diabetes mellitus without complication ([place])   . Hypertension     Past Surgical History:  Procedure Laterality Date  . ABDOMINAL HYSTERECTOMY     PID; ovaries resected.  Age 48.  Marland Kitchen APPENDECTOMY      Social History   Tobacco Use  . Smoking status: Former Smoker    Packs/day: 0.70    Years: 30.00    Pack years: 21.00    Types: Cigarettes  .  Smokeless tobacco: Never Used  Substance Use Topics  . Alcohol use: No    Family History  Problem Relation Age of Onset  . Cancer Mother 11       uterine cancer  . Heart disease Sister 73       AMI/CAD with stenting  . Hypertension Sister     ROS Per hpi  OBJECTIVE:  Blood pressure 136/82, pulse 90, temperature 98.3 F (36.8 C), temperature source Oral, resp. rate 18, height 5' 4.76" (1.645 m), weight 216 lb 3.2 oz (98.1 kg), SpO2 99 %.  Physical Exam  Constitutional: She is oriented to person, place, and time and well-developed, well-nourished, and in no distress.  HENT:  Head: Normocephalic and atraumatic.  Right Ear: Hearing, tympanic membrane, external ear and ear canal normal.  Left Ear: Hearing, tympanic membrane, external ear and ear canal normal.    Mouth/Throat: Oropharynx is clear and moist.  Eyes: EOM are normal. Pupils are equal, round, and reactive to light.  Neck: Neck supple.  Cardiovascular: Normal rate, regular rhythm and normal heart sounds. Exam reveals no gallop and no friction rub.  No murmur heard. Pulmonary/Chest: Effort normal and breath sounds normal. She has no wheezes. She has no rales.  Abdominal: Soft. Bowel sounds are normal. She exhibits no distension. There is no tenderness.  Lymphadenopathy:    She has no cervical adenopathy.  Neurological: She is alert and oriented to person, place, and time. Gait normal.  Skin: Skin is warm and dry.      Results for orders placed or performed in visit on 04/19/17 (from the past 24 hour(s))  POCT urinalysis dipstick     Status: Abnormal   Collection Time: 04/19/17 10:04 AM  Result Value Ref Range   Color, UA yellow yellow   Clarity, UA clear clear   Glucose, UA =500 (A) negative mg/dL   Bilirubin, UA negative negative   Ketones, POC UA negative negative mg/dL   Spec Grav, UA 1.025 1.010 - 1.025   Blood, UA trace-intact (A) negative   pH, UA 5.5 5.0 - 8.0   Protein Ur, POC trace (A) negative mg/dL   Urobilinogen, UA 0.2 0.2 or 1.0 E.U./dL   Nitrite, UA Negative Negative   Leukocytes, UA Negative Negative  POCT Wet + KOH Prep     Status: Abnormal   Collection Time: 04/19/17 10:49 AM  Result Value Ref Range   Yeast by KOH Absent Absent   Yeast by wet prep Absent Absent   WBC by wet prep None (A) Few   Clue Cells Wet Prep HPF POC None None   Trich by wet prep Absent Absent   Bacteria Wet Prep HPF POC Few Few   Epithelial Cells By [employer] ([facility]) None None, Few, Too numerous to count   RBC,UR,HPF,POC None None RBC/hpf  POCT glucose (manual entry)     Status: Abnormal   Collection Time: 04/19/17 10:57 AM  Result Value Ref Range   POC Glucose 232 (A) 70 - 99 mg/dl    No results found.   ASSESSMENT and PLAN 1. Urinary frequency Discussed with patient that  her symptoms are most likely related to glucosuria as no signs of infections noted. Patient has not been managing her DM for over a year. Checking labs today, fu 1 week. RTC precautions discussed.  - POCT urinalysis dipstick - + glucose  2. Vaginal discharge - POCT Wet + KOH Prep - no infections  3. Type 2 diabetes mellitus without complication, without long-term  current use of insulin (HCC) - CBC with Differential - Comprehensive metabolic panel - Lipid panel - TSH - Hemoglobin A1c - Microalbumin/Creatinine Ratio, Urine - POCT glucose (manual entry) - glyBURIDE (DIABETA) 5 MG tablet; Take 1 tablet (5 mg total) by mouth daily with breakfast.  4. Seasonal allergies - cetirizine (ZYRTEC) 10 MG tablet; Take 1 tablet (10 mg total) by mouth daily. - fluticasone (FLONASE) 50 MCG/ACT nasal spray; Place 2 sprays into both nostrils daily. - azelastine (ASTELIN) 0.1 % nasal spray; Place 2 sprays into both nostrils 2 (two) times daily. Use in each nostril as directed  5. Gastroesophageal reflux disease, esophagitis presence not specified Well controlled on PPI, refilled.   6. Essential hypertension - CBC with Differential - Comprehensive metabolic panel - Lipid panel - TSH     Return in about 1 week (around 04/26/2017).    Rutherford Guys, MD Primary Care at [place], [place] [address] Ph.  [phone] Fax [phone]

## 2017-04-19 NOTE — Patient Instructions (Signed)
     IF you received an x-ray today, you will receive an invoice from [facility]. Please contact [facility] at [phone] with questions or concerns regarding your invoice.   IF you received labwork today, you will receive an invoice from [facility]. Please contact [facility] at [phone] with questions or concerns regarding your invoice.   Our billing staff will not be able to assist you with questions regarding bills from these companies.  You will be contacted with the lab results as soon as they are available. The fastest way to get your results is to activate your My Chart account. Instructions are located on the last page of this paperwork. If you have not heard from us regarding the results in 2 weeks, please contact this office.     

## 2017-04-20 LAB — COMPREHENSIVE METABOLIC PANEL
ALT: 11 IU/L (ref 0–32)
AST: 10 IU/L (ref 0–40)
Albumin/Globulin Ratio: 1 — ABNORMAL LOW (ref 1.2–2.2)
Albumin: 4.1 g/dL (ref 3.5–5.5)
Alkaline Phosphatase: 150 IU/L — ABNORMAL HIGH (ref 39–117)
BUN/Creatinine Ratio: 13 (ref 9–23)
BUN: 11 mg/dL (ref 6–24)
Bilirubin Total: 0.3 mg/dL (ref 0.0–1.2)
CO2: 23 mmol/L (ref 20–29)
Calcium: 9.5 mg/dL (ref 8.7–10.2)
Chloride: 99 mmol/L (ref 96–106)
Creatinine, Ser: 0.82 mg/dL (ref 0.57–1.00)
GFR calc Af Amer: 93 mL/min/{1.73_m2} (ref >59)
GFR calc non Af Amer: 81 mL/min/{1.73_m2} (ref >59)
Globulin, Total: 4.1 g/dL (ref 1.5–4.5)
Glucose: 229 mg/dL — ABNORMAL HIGH (ref 65–99)
Potassium: 4.3 mmol/L (ref 3.5–5.2)
Sodium: 140 mmol/L (ref 134–144)
Total Protein: 8.2 g/dL (ref 6.0–8.5)

## 2017-04-20 LAB — CBC WITH DIFFERENTIAL/PLATELET
Basophils Absolute: 0 10*3/uL (ref 0.0–0.2)
Basos: 0 %
EOS (ABSOLUTE): 0.2 10*3/uL (ref 0.0–0.4)
Eos: 2 %
Hematocrit: 37.6 % (ref 34.0–46.6)
Hemoglobin: 12.3 g/dL (ref 11.1–15.9)
Immature Grans (Abs): 0 10*3/uL (ref 0.0–0.1)
Immature Granulocytes: 0 %
Lymphocytes Absolute: 3.3 10*3/uL — ABNORMAL HIGH (ref 0.7–3.1)
Lymphs: 31 %
MCH: 24.2 pg — ABNORMAL LOW (ref 26.6–33.0)
MCHC: 32.7 g/dL (ref 31.5–35.7)
MCV: 74 fL — ABNORMAL LOW (ref 79–97)
Monocytes Absolute: 0.4 10*3/uL (ref 0.1–0.9)
Monocytes: 4 %
Neutrophils Absolute: 6.5 10*3/uL (ref 1.4–7.0)
Neutrophils: 63 %
Platelets: 297 10*3/uL (ref 150–379)
RBC: 5.09 x10E6/uL (ref 3.77–5.28)
RDW: 16.6 % — ABNORMAL HIGH (ref 12.3–15.4)
WBC: 10.4 10*3/uL (ref 3.4–10.8)

## 2017-04-20 LAB — MICROALBUMIN / CREATININE URINE RATIO
Creatinine, Urine: 180.2 mg/dL
Microalb/Creat Ratio: 25.1 mg/g creat (ref 0.0–30.0)
Microalbumin, Urine: 45.2 ug/mL

## 2017-04-20 LAB — LIPID PANEL
Chol/HDL Ratio: 4.1 ratio (ref 0.0–4.4)
Cholesterol, Total: 201 mg/dL — ABNORMAL HIGH (ref 100–199)
HDL: 49 mg/dL (ref >39)
LDL Calculated: 120 mg/dL — ABNORMAL HIGH (ref 0–99)
Triglycerides: 158 mg/dL — ABNORMAL HIGH (ref 0–149)
VLDL Cholesterol Cal: 32 mg/dL (ref 5–40)

## 2017-04-20 LAB — HEMOGLOBIN A1C
Est. average glucose Bld gHb Est-mCnc: 229 mg/dL
Hgb A1c MFr Bld: 9.6 % — ABNORMAL HIGH (ref 4.8–5.6)

## 2017-04-20 LAB — TSH: TSH: 1.15 u[IU]/mL (ref 0.450–4.500)

## 2017-05-03 ENCOUNTER — Ambulatory Visit: Payer: BLUE CROSS/BLUE SHIELD | Admitting: Family Medicine

## 2017-05-15 ENCOUNTER — Telehealth: Payer: Self-pay | Admitting: Family Medicine

## 2017-05-15 NOTE — Telephone Encounter (Signed)
Copied from [place] [id number]. Topic: Quick Communication - See Telephone Encounter >> May 15, 2017 10:50 AM Oneta Rack wrote: CRM for notification. See Telephone encounter for:   05/15/17.  Relation to pt: self Call back number: [phone] Pharmacy: [facility], [place]. [phone] (Phone) [phone] (Fax)    Reason for call:  Patient requesting Dexlansoprazole (DEXILANT) 30 MG capsule, advised patient in the future please contact pharmacy, please advise

## 2017-05-17 NOTE — Telephone Encounter (Signed)
Pt calling again today to see if medication can be refilled.

## 2017-05-17 NOTE — Telephone Encounter (Signed)
Pt was calling pharmacy on old prescription bottle from Dr. Ronnald Ramp Advised pt Dr. Pamella Pert sent in new rx 04/19/2017 for #30 with 2RF Pt will call pharmacy

## 2017-07-23 ENCOUNTER — Encounter: Payer: Self-pay | Admitting: Family Medicine

## 2017-07-23 ENCOUNTER — Ambulatory Visit: Payer: BLUE CROSS/BLUE SHIELD | Admitting: Family Medicine

## 2017-07-23 ENCOUNTER — Other Ambulatory Visit: Payer: Self-pay

## 2017-07-23 VITALS — BP 120/82 | HR 97 | Temp 98.4°F | Ht 63.0 in | Wt 215.6 lb

## 2017-07-23 DIAGNOSIS — Z1231 Encounter for screening mammogram for malignant neoplasm of breast: Secondary | ICD-10-CM | POA: Diagnosis not present

## 2017-07-23 DIAGNOSIS — R110 Nausea: Secondary | ICD-10-CM

## 2017-07-23 DIAGNOSIS — N764 Abscess of vulva: Secondary | ICD-10-CM

## 2017-07-23 DIAGNOSIS — E119 Type 2 diabetes mellitus without complications: Secondary | ICD-10-CM

## 2017-07-23 LAB — POCT GLYCOSYLATED HEMOGLOBIN (HGB A1C): Hemoglobin A1C: 8

## 2017-07-23 MED ORDER — ONDANSETRON HCL 4 MG PO TABS: 4.0000 mg | ORAL_TABLET | Freq: Three times a day (TID) | ORAL | 0 refills | Status: DC | PRN

## 2017-07-23 NOTE — Patient Instructions (Signed)
     IF you received an x-ray today, you will receive an invoice from [facility]. Please contact [facility] at [phone] with questions or concerns regarding your invoice.   IF you received labwork today, you will receive an invoice from [facility]. Please contact [facility] at [phone] with questions or concerns regarding your invoice.   Our billing staff will not be able to assist you with questions regarding bills from these companies.  You will be contacted with the lab results as soon as they are available. The fastest way to get your results is to activate your My Chart account. Instructions are located on the last page of this paperwork. If you have not heard from us regarding the results in 2 weeks, please contact this office.     

## 2017-07-23 NOTE — Progress Notes (Signed)
3/5/20193:00 PM  Karen [name] Oct 13, 1960, 57 y.o. female [id number]  Chief Complaint  Patient presents with  . Nausea    having nausea, [facility]. Still has 2 days of tamaflu left to take.   Marland Kitchen Hypertension    no longer taking the medication for bp, doesnt feel she needs it anymore. Last time taken at the end of Feb.   . Referral    wants referral for mammagram    HPI:   Patient is a 57 y.o. female with past medical history significant for DM2 who presents today for mild nausea and diarrhea that started after being prescribed tamiflu, she has 2 more pills left to complete treatment. No abd pain or vomiting, no blood in stool. Eating and drinking ok. No fever.   She has also been taking glyburide as prescribed and working on her diet. Denies any ssx of hypoglycemia.  She is also c/o of bump on labia, been present for several months but of recent it has grown and slightly tender. Denies any fever or chills, any drainage.   She is also requesting referral for mammogram, denies abnormal, last one > 2 years ago, denies any breast sx  Depression screen [facility] 2/9 07/23/2017 04/19/2017 04/13/2017  Decreased Interest 0 0 0  Down, Depressed, Hopeless 0 0 0  PHQ - 2 Score 0 0 0  Altered sleeping - - -  Tired, decreased energy - - -  Change in appetite - - -  Feeling bad or failure about yourself  - - -  Trouble concentrating - - -  Moving slowly or fidgety/restless - - -  Suicidal thoughts - - -  PHQ-9 Score - - -  Difficult doing work/chores - - -    Allergies  Allergen Reactions  . Orange Fruit [Citrus] Hives  . Penicillins Hives    Prior to Admission medications   Medication Sig Start Date End Date Taking? Authorizing Provider  amLODipine (NORVASC) 5 MG tablet Take 5 mg by mouth daily.   Yes [provider]  fluticasone (FLONASE) 50 MCG/ACT nasal spray Place 2 sprays into both nostrils daily. 04/19/17  Yes Rutherford Guys,  MD  glyBURIDE (DIABETA) 5 MG tablet Take 1 tablet (5 mg total) by mouth daily with breakfast. 04/19/17  Yes Rutherford Guys, MD  hydrocortisone cream 1 % Apply to affected area 2 times daily 06/06/15  Yes Joy, Shawn C, PA-C  Dexlansoprazole (DEXILANT) 30 MG capsule Take 1 capsule (30 mg total) by mouth daily.  04/19/17   Rutherford Guys, MD    Past Medical History:  Diagnosis Date  . Allergy   . Diabetes mellitus without complication ([place])   . Hypertension     Past Surgical History:  Procedure Laterality Date  . ABDOMINAL HYSTERECTOMY     PID; ovaries resected.  Age 27.  Marland Kitchen APPENDECTOMY      Social History   Tobacco Use  . Smoking status: Former Smoker    Packs/day: 0.70    Years: 30.00    Pack years: 21.00    Types: Cigarettes  . Smokeless tobacco: Never Used  Substance Use Topics  . Alcohol use: No    Family History  Problem Relation Age of Onset  . Cancer Mother 7       uterine cancer  . Heart disease Sister 49       AMI/CAD with stenting  . Hypertension Sister     Review  of Systems  Constitutional: Negative for chills and fever.  Respiratory: Negative for cough and shortness of breath.   Cardiovascular: Negative for chest pain, palpitations and leg swelling.  Gastrointestinal: Positive for diarrhea and nausea. Negative for abdominal pain, blood in stool, heartburn, melena and vomiting.   Per hpi  OBJECTIVE:  Blood pressure 120/82, pulse 97, temperature 98.4 F (36.9 C), temperature source Oral, height 5\' 3"  (1.6 m), weight 215 lb 9.6 oz (97.8 kg), SpO2 100 %.  Physical Exam  Constitutional: She is oriented to person, place, and time and well-developed, well-nourished, and in no distress.  HENT:  Head: Normocephalic and atraumatic.  Mouth/Throat: Oropharynx is clear and moist. No oropharyngeal exudate.  Eyes: EOM are normal. Pupils are equal, round, and reactive to light. No scleral icterus.  Neck: Neck supple.  Cardiovascular: Normal rate, regular  rhythm and normal heart sounds. Exam reveals no gallop and no friction rub.  No murmur heard. Pulmonary/Chest: Effort normal and breath sounds normal. She has no wheezes. She has no rales.  Abdominal: Soft. Bowel sounds are normal. She exhibits no distension. There is no tenderness.  Genitourinary:  Genitourinary Comments: Right labia with a 2 cm indurated mass with a small pustule with drainage. No erythema, warmth or tenderness  Musculoskeletal: She exhibits no edema.  Neurological: She is alert and oriented to person, place, and time. Gait normal.  Skin: Skin is warm and dry.    Results for orders placed or performed in visit on 07/23/17 (from the past 24 hour(s))  POCT glycosylated hemoglobin (Hb A1C)     Status: Abnormal   Collection Time: 07/23/17  3:05 PM  Result Value Ref Range   Hemoglobin A1C 8.0     ASSESSMENT and PLAN  1. Type 2 diabetes mellitus without complication, without long-term current use of insulin (HCC) a1c improved, cont with LFM - POCT glycosylated hemoglobin (Hb A1C)  2. Abscess of right genital labia Discussed sitz bath, referring to gyn - Ambulatory referral to Obstetrics / Gynecology  3. Nausea without vomiting Most likely from tamiflu, discussed supportive measures, zofran prn, RTC precautions given.  4. Visit for screening mammogram - MM DIGITAL SCREENING BILATERAL; Future  Other orders - ondansetron (ZOFRAN) 4 MG tablet; Take 1 tablet (4 mg total) by mouth every 8 (eight) hours as needed for nausea or vomiting.  Return in about 3 months (around 10/23/2017).    Rutherford Guys, MD Primary Care at [place], [place] [address] Ph.  [phone] Fax [phone]

## 2017-08-26 ENCOUNTER — Ambulatory Visit: Payer: Self-pay

## 2017-08-27 ENCOUNTER — Encounter: Payer: Self-pay | Admitting: Obstetrics and Gynecology

## 2017-09-13 ENCOUNTER — Other Ambulatory Visit: Payer: Self-pay | Admitting: Family Medicine

## 2017-09-13 NOTE — Telephone Encounter (Signed)
Copied from [place] [id number]. Topic: Quick Communication - See Telephone Encounter >> Sep 13, 2017 11:26 AM Hewitt Shorts wrote: CRM for notification. See Telephone encounter for: 09/13/17.pt is looking to refill her dexilant 90 day supply [facility] on [address] [id number]  Best number [phone]

## 2017-09-13 NOTE — Telephone Encounter (Signed)
Pt requesting refill of Dexlansoprazole(Dexilant) 30mg   LOV:07/23/17  PCP: Dr. Carol Ada on [address]

## 2017-09-16 NOTE — Telephone Encounter (Signed)
Already filled in another encounter

## 2017-09-19 ENCOUNTER — Telehealth: Payer: Self-pay

## 2017-09-19 NOTE — Telephone Encounter (Signed)
Pharmacist is saying that this medication is too expensive for the pt and to please change it. Dexilant 30mg 

## 2017-09-20 MED ORDER — PANTOPRAZOLE SODIUM 20 MG PO TBEC: 20.0000 mg | DELAYED_RELEASE_TABLET | Freq: Every day | ORAL | 2 refills | Status: DC

## 2017-09-20 NOTE — Telephone Encounter (Signed)
What medication is the pharmacist referring to? thanks

## 2017-09-20 NOTE — Telephone Encounter (Signed)
Dexilant 30mg  cost to much, insurance is not paying. Requesting a protonix?

## 2017-09-20 NOTE — Telephone Encounter (Signed)
done

## 2017-09-25 ENCOUNTER — Ambulatory Visit: Payer: Self-pay

## 2017-09-26 ENCOUNTER — Ambulatory Visit
Admission: RE | Admit: 2017-09-26 | Discharge: 2017-09-26 | Disposition: A | Discharge status: Home / Self Care | Payer: BLUE CROSS/BLUE SHIELD | Source: Ambulatory Visit | Attending: Family Medicine | Admitting: Family Medicine

## 2017-09-26 DIAGNOSIS — Z1231 Encounter for screening mammogram for malignant neoplasm of breast: Secondary | ICD-10-CM

## 2017-09-27 ENCOUNTER — Ambulatory Visit: Payer: Self-pay

## 2017-09-30 ENCOUNTER — Encounter: Payer: Self-pay | Admitting: Obstetrics and Gynecology

## 2017-10-14 ENCOUNTER — Encounter: Payer: Self-pay | Admitting: Family Medicine

## 2017-10-24 ENCOUNTER — Encounter: Payer: Self-pay | Admitting: Obstetrics and Gynecology

## 2017-11-06 ENCOUNTER — Ambulatory Visit: Payer: BLUE CROSS/BLUE SHIELD | Admitting: Family Medicine

## 2017-11-08 ENCOUNTER — Encounter: Payer: Self-pay | Admitting: Obstetrics and Gynecology

## 2017-11-14 ENCOUNTER — Other Ambulatory Visit: Payer: Self-pay

## 2017-11-14 ENCOUNTER — Encounter: Payer: Self-pay | Admitting: Family Medicine

## 2017-11-14 ENCOUNTER — Ambulatory Visit: Payer: BLUE CROSS/BLUE SHIELD | Admitting: Family Medicine

## 2017-11-14 VITALS — BP 138/84 | HR 85 | Temp 98.5°F | Ht 63.0 in | Wt 220.6 lb

## 2017-11-14 DIAGNOSIS — J3489 Other specified disorders of nose and nasal sinuses: Secondary | ICD-10-CM | POA: Diagnosis not present

## 2017-11-14 DIAGNOSIS — E119 Type 2 diabetes mellitus without complications: Secondary | ICD-10-CM | POA: Diagnosis not present

## 2017-11-14 DIAGNOSIS — I10 Essential (primary) hypertension: Secondary | ICD-10-CM

## 2017-11-14 DIAGNOSIS — E785 Hyperlipidemia, unspecified: Secondary | ICD-10-CM

## 2017-11-14 LAB — POCT GLYCOSYLATED HEMOGLOBIN (HGB A1C): Hemoglobin A1C: 9 % — AB (ref 4.0–5.6)

## 2017-11-14 MED ORDER — MUPIROCIN 2 % EX OINT: 1.0000 "application " | TOPICAL_OINTMENT | Freq: Three times a day (TID) | CUTANEOUS | 1 refills | Status: DC

## 2017-11-14 MED ORDER — GLYBURIDE 5 MG PO TABS: 5.0000 mg | ORAL_TABLET | Freq: Every day | ORAL | 1 refills | Status: DC

## 2017-11-14 MED ORDER — METFORMIN HCL ER 500 MG PO TB24: 500.0000 mg | ORAL_TABLET | Freq: Every day | ORAL | 1 refills | Status: DC

## 2017-11-14 MED ORDER — AMLODIPINE BESYLATE 5 MG PO TABS: 5.0000 mg | ORAL_TABLET | Freq: Every day | ORAL | 1 refills | Status: DC

## 2017-11-14 NOTE — Progress Notes (Signed)
6/27/201911:38 AM  Karen [name] July 02, 1960, 57 y.o. female [id number]  Chief Complaint  Patient presents with  . Follow-up    chronic conditions. Having soreness on the inside of the nose, Used a spray for sinusitis that caused sores and soreness. Has been sore for 2 weeks    HPI:   Patient is a 57 y.o. female with past medical history significant for DM2, HTN, HLP who presents today for followup of chronic medical conditions   Diabetes Mellitus Type II, Follow-up: Patient here for follow-up of Type 2 diabetes mellitus.  Current symptoms/problems include increase appetite, waking up in the middle of her sleep to eat  Known diabetic complications: none Cardiovascular risk factors: diabetes mellitus, dyslipidemia and hypertension Current diabetic medications include oral agent (monotherapy): glyburide, taking when she goes to sleep, works 3rd shift Eye exam current (within one year): referral made today Weight trend: increasing steadily Prior visit with dietician: no Current diet: in general, an "unhealthy" diet drinks several sodas a day, eats fast food Current exercise: none  Current monitoring regimen: none Any episodes of hypoglycemia? Potentially, middle of the night, awakening hungry  Is She on ACE inhibitor or angiotensin II receptor blocker?  No, Not Indicated, normal urine microalb/crt ratio, nov 2018  Last a1c 8.0  HTN - taking meds as prescribed, denies any side effects  HLP - last LDL 120, had improved, was managing with LFM which she is not doing anymore  Nasal sore - right nares for about the past 2 weeks, started after using a nasal spray for sinus issues, she noticed it had expired over a year ago, has stopped using it since, not healed yet  Fall Risk  11/14/2017 07/23/2017 04/19/2017 04/13/2017  Falls in the past year? No No No No     Depression screen [facility] 2/9 11/14/2017 07/23/2017 04/19/2017  Decreased Interest 0 0 0  Down, Depressed, Hopeless 0 0 0    PHQ - 2 Score 0 0 0  Altered sleeping - - -  Tired, decreased energy - - -  Change in appetite - - -  Feeling bad or failure about yourself  - - -  Trouble concentrating - - -  Moving slowly or fidgety/restless - - -  Suicidal thoughts - - -  PHQ-9 Score - - -  Difficult doing work/chores - - -    Allergies  Allergen Reactions  . Orange Fruit [Citrus] Hives  . Penicillins Hives    Prior to Admission medications   Medication Sig Start Date End Date Taking? Authorizing Provider  amLODipine (NORVASC) 5 MG tablet Take 5 mg by mouth daily.   Yes [provider]  fluticasone (FLONASE) 50 MCG/ACT nasal spray Place 2 sprays into both nostrils daily. 04/19/17  Yes Rutherford Guys, MD  glyBURIDE (DIABETA) 5 MG tablet Take 1 tablet (5 mg total) by mouth daily with breakfast. 04/19/17  Yes Rutherford Guys, MD  hydrocortisone cream 1 % Apply to affected area 2 times daily 06/06/15  Yes Joy, Shawn C, PA-C  ondansetron (ZOFRAN) 4 MG tablet Take 1 tablet (4 mg total) by mouth every 8 (eight) hours as needed for nausea or vomiting. 07/23/17  Yes Rutherford Guys, MD  pantoprazole (PROTONIX) 20 MG tablet Take 1 tablet (20 mg total) by mouth daily. 09/20/17  Yes Rutherford Guys, MD    Past Medical History:  Diagnosis Date  . Allergy   . Diabetes mellitus without complication ([place])   . Hypertension  Past Surgical History:  Procedure Laterality Date  . ABDOMINAL HYSTERECTOMY     PID; ovaries resected.  Age 76.  Marland Kitchen APPENDECTOMY      Social History   Tobacco Use  . Smoking status: Former Smoker    Packs/day: 0.70    Years: 30.00    Pack years: 21.00    Types: Cigarettes  . Smokeless tobacco: Never Used  Substance Use Topics  . Alcohol use: No    Family History  Problem Relation Age of Onset  . Cancer Mother 65       uterine cancer  . Heart disease Sister 46       AMI/CAD with stenting  . Hypertension Sister   . Breast cancer Maternal Aunt     Review of Systems   Constitutional: Negative for chills and fever.  Respiratory: Negative for cough and shortness of breath.   Cardiovascular: Negative for chest pain, palpitations and leg swelling.  Gastrointestinal: Negative for abdominal pain, nausea and vomiting.  Endo/Heme/Allergies: Negative for polydipsia.     OBJECTIVE:  Blood pressure 138/84, pulse 85, temperature 98.5 F (36.9 C), temperature source Oral, height 5\' 3"  (1.6 m), weight 220 lb 9.6 oz (100.1 kg), SpO2 98 %.  Wt Readings from Last 3 Encounters:  11/14/17 220 lb 9.6 oz (100.1 kg)  07/23/17 215 lb 9.6 oz (97.8 kg)  04/19/17 216 lb 3.2 oz (98.1 kg)    Physical Exam  Constitutional: She is oriented to person, place, and time.  HENT:  Head: Normocephalic and atraumatic.  Mouth/Throat: Oropharynx is clear and moist. No oropharyngeal exudate.  Right nares with a small ulceration at tip, no bleeding or crusting noted  Eyes: Pupils are equal, round, and reactive to light. EOM are normal. No scleral icterus.  Neck: Neck supple.  Cardiovascular: Normal rate, regular rhythm and normal heart sounds. Exam reveals no gallop and no friction rub.  No murmur heard. Pulmonary/Chest: Effort normal and breath sounds normal. She has no wheezes. She has no rales.  Musculoskeletal: She exhibits no edema.  Neurological: She is alert and oriented to person, place, and time.  Skin: Skin is warm and dry.   Results for orders placed or performed in visit on 11/14/17 (from the past 24 hour(s))  POCT glycosylated hemoglobin (Hb A1C)     Status: Abnormal   Collection Time: 11/14/17 11:44 AM  Result Value Ref Range   Hemoglobin A1C 9.0 (A) 4.0 - 5.6 %   HbA1c POC (<> result, manual entry)  4.0 - 5.6 %   HbA1c, POC (prediabetic range)  5.7 - 6.4 %   HbA1c, POC (controlled diabetic range)  0.0 - 7.0 %    Diabetic Foot Exam - Simple   Simple Foot Form Visual Inspection No deformities, no ulcerations, no other skin breakdown bilaterally:   Yes Sensation Testing Intact to touch and monofilament testing bilaterally:  Yes Pulse Check Comments Felt all pricks with the filament, no broken skin     ASSESSMENT and PLAN  1. Type 2 diabetes mellitus without complication, without long-term current use of insulin (HCC) Uncontrolled, worse. Discussed proper timing of when to take medication. Adding low dose of metformin, h/o hypoglycemia before, consider GLP1 weekly, would make more sense with her work schedule and aide with weight loss. Referring to nutrition to help with food choices/meal planning.  - Ambulatory referral to Ophthalmology - POCT glycosylated hemoglobin (Hb A1C) - Comprehensive metabolic panel - Referral to Nutrition and Diabetes Services  2. Essential hypertension Controlled. Continue  current regime.   3. Dyslipidemia Expecting it to be worse given recent unhealthy diet habits. Consider statin as she is DM. - Comprehensive metabolic panel - Lipid panel  4. Nasal sore - mupirocin ointment (BACTROBAN) 2 %; Apply 1 application topically 3 (three) times daily. RTC precautions reviewed.  Other orders - metFORMIN (GLUCOPHAGE-XR) 500 MG 24 hr tablet; Take 1 tablet (500 mg total) by mouth daily. With meal prior to going to work - amLODipine (NORVASC) 5 MG tablet; Take 1 tablet (5 mg total) by mouth daily. - glyBURIDE (DIABETA) 5 MG tablet; Take 1 tablet (5 mg total) by mouth daily. Take with meal prior to going to work   Return in about 3 months (around 02/14/2018).    Rutherford Guys, MD Primary Care at [facility] [place], [place] [address] Ph.  [phone] Fax [phone]

## 2017-11-14 NOTE — Patient Instructions (Signed)
     IF you received an x-ray today, you will receive an invoice from [facility]. Please contact [facility] at [phone] with questions or concerns regarding your invoice.   IF you received labwork today, you will receive an invoice from [facility]. Please contact [facility] at [phone] with questions or concerns regarding your invoice.   Our billing staff will not be able to assist you with questions regarding bills from these companies.  You will be contacted with the lab results as soon as they are available. The fastest way to get your results is to activate your My Chart account. Instructions are located on the last page of this paperwork. If you have not heard from us regarding the results in 2 weeks, please contact this office.     

## 2017-11-15 LAB — COMPREHENSIVE METABOLIC PANEL
ALT: 15 IU/L (ref 0–32)
AST: 12 IU/L (ref 0–40)
Albumin/Globulin Ratio: 1 — ABNORMAL LOW (ref 1.2–2.2)
Albumin: 4 g/dL (ref 3.5–5.5)
Alkaline Phosphatase: 133 IU/L — ABNORMAL HIGH (ref 39–117)
BUN/Creatinine Ratio: 13 (ref 9–23)
BUN: 10 mg/dL (ref 6–24)
Bilirubin Total: 0.2 mg/dL (ref 0.0–1.2)
CO2: 23 mmol/L (ref 20–29)
Calcium: 9.5 mg/dL (ref 8.7–10.2)
Chloride: 105 mmol/L (ref 96–106)
Creatinine, Ser: 0.75 mg/dL (ref 0.57–1.00)
GFR calc Af Amer: 104 mL/min/{1.73_m2} (ref >59)
GFR calc non Af Amer: 90 mL/min/{1.73_m2} (ref >59)
Globulin, Total: 3.9 g/dL (ref 1.5–4.5)
Glucose: 212 mg/dL — ABNORMAL HIGH (ref 65–99)
Potassium: 4 mmol/L (ref 3.5–5.2)
Sodium: 141 mmol/L (ref 134–144)
Total Protein: 7.9 g/dL (ref 6.0–8.5)

## 2017-11-15 LAB — LIPID PANEL
Chol/HDL Ratio: 4 ratio (ref 0.0–4.4)
Cholesterol, Total: 168 mg/dL (ref 100–199)
HDL: 42 mg/dL (ref >39)
LDL Calculated: 97 mg/dL (ref 0–99)
Triglycerides: 145 mg/dL (ref 0–149)
VLDL Cholesterol Cal: 29 mg/dL (ref 5–40)

## 2017-11-29 ENCOUNTER — Encounter: Payer: Self-pay | Admitting: Family Medicine

## 2017-12-16 ENCOUNTER — Ambulatory Visit: Payer: Self-pay | Admitting: Skilled Nursing Facility1

## 2018-01-14 ENCOUNTER — Encounter: Payer: Self-pay | Admitting: Family Medicine

## 2018-01-14 ENCOUNTER — Other Ambulatory Visit: Payer: Self-pay

## 2018-01-14 ENCOUNTER — Ambulatory Visit: Payer: BLUE CROSS/BLUE SHIELD | Admitting: Family Medicine

## 2018-01-14 VITALS — BP 120/74 | HR 96 | Temp 99.2°F | Ht 63.0 in | Wt 212.6 lb

## 2018-01-14 DIAGNOSIS — J0100 Acute maxillary sinusitis, unspecified: Secondary | ICD-10-CM

## 2018-01-14 DIAGNOSIS — R05 Cough: Secondary | ICD-10-CM | POA: Diagnosis not present

## 2018-01-14 DIAGNOSIS — R062 Wheezing: Secondary | ICD-10-CM | POA: Diagnosis not present

## 2018-01-14 DIAGNOSIS — R059 Cough, unspecified: Secondary | ICD-10-CM

## 2018-01-14 MED ORDER — DOXYCYCLINE HYCLATE 100 MG PO TABS: 100.0000 mg | ORAL_TABLET | Freq: Two times a day (BID) | ORAL | 0 refills | Status: DC

## 2018-01-14 MED ORDER — ALBUTEROL SULFATE (2.5 MG/3ML) 0.083% IN NEBU
2.5000 mg | INHALATION_SOLUTION | Freq: Once | RESPIRATORY_TRACT | Status: AC
  Administered 2018-01-14: 2.5 mg via RESPIRATORY_TRACT

## 2018-01-14 MED ORDER — ALBUTEROL SULFATE HFA 108 (90 BASE) MCG/ACT IN AERS
2.0000 | INHALATION_SPRAY | Freq: Four times a day (QID) | RESPIRATORY_TRACT | 0 refills | Status: DC | PRN
Start: 2018-01-14 — End: 2018-12-30

## 2018-01-14 NOTE — Progress Notes (Signed)
8/27/201910:33 AM  Karen [name] 10/21/1960, 57 y.o. female [id number]  Chief Complaint  Patient presents with  . Nasal Congestion    congestion pressure on the right side of head and ear since Friday. Stomach pain from coughing.  taking tylenol sinus and bendryl with no resolve. The bactrofan for the nose is not working either. Want information on the use f having a pneumo shot.    HPI:   Patient is a 57 y.o. female with past medical history significant for DM2 who presents today for 5 days of cough, congestion  Patient reports 5 days of cough, nasal congestion, right sided facial pain + wheezing that started last night Denies SOB Has some nausea and pain from all the coughing Takes zyrtec daily, taking OTC cold meds Not feeling any better Former smoker  Fall Risk  01/14/2018 11/14/2017 07/23/2017 04/19/2017 04/13/2017  Falls in the past year? No No No No No     Depression screen [facility] 2/9 01/14/2018 11/14/2017 07/23/2017  Decreased Interest 0 0 0  Down, Depressed, Hopeless 0 0 0  PHQ - 2 Score 0 0 0  Altered sleeping - - -  Tired, decreased energy - - -  Change in appetite - - -  Feeling bad or failure about yourself  - - -  Trouble concentrating - - -  Moving slowly or fidgety/restless - - -  Suicidal thoughts - - -  PHQ-9 Score - - -  Difficult doing work/chores - - -    Allergies  Allergen Reactions  . Orange Fruit [Citrus] Hives  . Penicillins Hives    Prior to Admission medications   Medication Sig Start Date End Date Taking? Authorizing Provider  amLODipine (NORVASC) 5 MG tablet Take 1 tablet (5 mg total) by mouth daily. 11/14/17  Yes Rutherford Guys, MD  glyBURIDE (DIABETA) 5 MG tablet Take 1 tablet (5 mg total) by mouth daily. Take with meal prior to going to work 11/14/17  Yes Pamella Pert, Lilia Argue, MD  hydrocortisone cream 1 % Apply to affected area 2 times daily 06/06/15  Yes Joy, Shawn C, PA-C  metFORMIN (GLUCOPHAGE-XR) 500 MG 24 hr tablet Take 1 tablet (500  mg total) by mouth daily. With meal prior to going to work 11/14/17  Yes Pamella Pert, Lilia Argue, MD  mupirocin ointment (BACTROBAN) 2 % Apply 1 application topically 3 (three) times daily. 11/14/17  Yes Rutherford Guys, MD  ondansetron (ZOFRAN) 4 MG tablet Take 1 tablet (4 mg total) by mouth every 8 (eight) hours as needed for nausea or vomiting. 07/23/17  Yes Rutherford Guys, MD  pantoprazole (PROTONIX) 20 MG tablet Take 1 tablet (20 mg total) by mouth daily. 09/20/17  Yes Rutherford Guys, MD    Past Medical History:  Diagnosis Date  . Allergy   . Diabetes mellitus without complication ([place])   . Hypertension     Past Surgical History:  Procedure Laterality Date  . ABDOMINAL HYSTERECTOMY     PID; ovaries resected.  Age 24.  Marland Kitchen APPENDECTOMY      Social History   Tobacco Use  . Smoking status: Former Smoker    Packs/day: 0.70    Years: 30.00    Pack years: 21.00    Types: Cigarettes  . Smokeless tobacco: Never Used  Substance Use Topics  . Alcohol use: No    Family History  Problem Relation Age of Onset  . Cancer Mother 60       uterine cancer  .  Heart disease Sister 64       AMI/CAD with stenting  . Hypertension Sister   . Breast cancer Maternal Aunt     ROS Per hpi  OBJECTIVE:  Blood pressure 120/74, pulse 96, temperature 99.2 F (37.3 C), temperature source Oral, height 5\' 3"  (1.6 m), weight 212 lb 9.6 oz (96.4 kg), SpO2 100 %. Body mass index is 37.66 kg/m.   Physical Exam  Constitutional: She is oriented to person, place, and time. She appears well-developed and well-nourished. She has a sickly appearance.  HENT:  Head: Normocephalic and atraumatic.  Right Ear: Hearing, tympanic membrane, external ear and ear canal normal.  Left Ear: Hearing, tympanic membrane, external ear and ear canal normal.  Nose: Mucosal edema and rhinorrhea present. Right sinus exhibits maxillary sinus tenderness.  Mouth/Throat: Oropharynx is clear and moist.  Eyes: Pupils are equal,  round, and reactive to light. Conjunctivae and EOM are normal.  Neck: Neck supple.  Cardiovascular: Normal rate, regular rhythm and normal heart sounds. Exam reveals no gallop and no friction rub.  No murmur heard. Pulmonary/Chest: Effort normal. She has wheezes. She has no rales.  Musculoskeletal: She exhibits no edema.  Lymphadenopathy:    She has no cervical adenopathy.  Neurological: She is alert and oriented to person, place, and time.  Skin: Skin is warm and dry.  Psychiatric: She has a normal mood and affect.  Nursing note and vitals reviewed. minimal response to albuterol  ASSESSMENT and PLAN  1. Acute non-recurrent maxillary sinusitis Discussed supportive measures, new meds r/se/b and RTC precautions. Patient educational handout given. 2. Cough - albuterol (PROVENTIL) (2.5 MG/3ML) 0.083% nebulizer solution 2.5 mg  3. Wheezing - albuterol (PROVENTIL) (2.5 MG/3ML) 0.083% nebulizer solution 2.5 mg  Other orders - doxycycline (VIBRA-TABS) 100 MG tablet; Take 1 tablet (100 mg total) by mouth 2 (two) times daily. - albuterol (PROVENTIL HFA;VENTOLIN HFA) 108 (90 Base) MCG/ACT inhaler; Inhale 2 puffs into the lungs every 6 (six) hours as needed for wheezing or shortness of breath.  Return in about 2 days (around 01/16/2018) for wheezing.    Rutherford Guys, MD Primary Care at [address] [place], [address] [address] Ph.  [phone] Fax [phone]

## 2018-01-14 NOTE — Patient Instructions (Addendum)
   If you have lab work done today you will be contacted with your lab results within the next 2 weeks.  If you have not heard from us then please contact us. The fastest way to get your results is to register for My Chart.   IF you received an x-ray today, you will receive an invoice from [facility]. Please contact [facility] at [phone] with questions or concerns regarding your invoice.   IF you received labwork today, you will receive an invoice from [facility]. Please contact [facility] at [phone] with questions or concerns regarding your invoice.   Our billing staff will not be able to assist you with questions regarding bills from these companies.  You will be contacted with the lab results as soon as they are available. The fastest way to get your results is to activate your My Chart account. Instructions are located on the last page of this paperwork. If you have not heard from us regarding the results in 2 weeks, please contact this office.     Sinusitis, Adult Sinusitis is soreness and inflammation of your sinuses. Sinuses are hollow spaces in the bones around your face. They are located:  Around your eyes.  In the middle of your forehead.  Behind your nose.  In your cheekbones.  Your sinuses and nasal passages are lined with a stringy fluid (mucus). Mucus normally drains out of your sinuses. When your nasal tissues get inflamed or swollen, the mucus can get trapped or blocked so air cannot flow through your sinuses. This lets bacteria, viruses, and funguses grow, and that leads to infection. Follow these instructions at home: Medicines  Take, use, or apply over-the-counter and prescription medicines only as told by your doctor. These may include nasal sprays.  If you were prescribed an antibiotic medicine, take it as told by your doctor. Do not stop taking the antibiotic even if you start to feel better. Hydrate and Humidify  Drink enough  water to keep your pee (urine) clear or pale yellow.  Use a cool mist humidifier to keep the humidity level in your home above 50%.  Breathe in steam for 10-15 minutes, 3-4 times a day or as told by your doctor. You can do this in the bathroom while a hot shower is running.  Try not to spend time in cool or dry air. Rest  Rest as much as possible.  Sleep with your head raised (elevated).  Make sure to get enough sleep each night. General instructions  Put a warm, moist washcloth on your face 3-4 times a day or as told by your doctor. This will help with discomfort.  Wash your hands often with soap and water. If there is no soap and water, use hand sanitizer.  Do not smoke. Avoid being around people who are smoking (secondhand smoke).  Keep all follow-up visits as told by your doctor. This is important. Contact a doctor if:  You have a fever.  Your symptoms get worse.  Your symptoms do not get better within 10 days. Get help right away if:  You have a very bad headache.  You cannot stop throwing up (vomiting).  You have pain or swelling around your face or eyes.  You have trouble seeing.  You feel confused.  Your neck is stiff.  You have trouble breathing. This information is not intended to replace advice given to you by your health care provider. Make sure you discuss any questions you have with your health   care provider. Document Released: 10/24/2007 Document Revised: 01/01/2016 Document Reviewed: 03/02/2015 Elsevier Interactive Patient Education  2018 Elsevier Inc.  

## 2018-01-16 ENCOUNTER — Encounter: Payer: Self-pay | Admitting: Family Medicine

## 2018-01-16 ENCOUNTER — Other Ambulatory Visit: Payer: Self-pay

## 2018-01-16 ENCOUNTER — Ambulatory Visit: Payer: BLUE CROSS/BLUE SHIELD | Admitting: Family Medicine

## 2018-01-16 VITALS — BP 115/76 | HR 96 | Temp 99.2°F | Ht 63.0 in | Wt 212.0 lb

## 2018-01-16 DIAGNOSIS — R062 Wheezing: Secondary | ICD-10-CM

## 2018-01-16 NOTE — Progress Notes (Signed)
8/29/20198:55 AM  EMOREE SASAKI [birth year], 57 y.o. female [id number]  Chief Complaint  Patient presents with  . Follow-up    requesting medicine that will help cough more productive to eliminate phlegm    HPI:   Patient is a 57 y.o. female  who presents today for followup from recent coughing, wheezing, SOB, and sinus infection  She has been on doxy x 48 hours Has used albuterol twice Overall feeling much better Coughing, chest tightness, wheezing much improved Has more energy Has not acute concerns today   Fall Risk  01/16/2018 01/14/2018 11/14/2017 07/23/2017 04/19/2017  Falls in the past year? No No No No No     Depression screen [facility] 2/9 01/16/2018 01/14/2018 11/14/2017  Decreased Interest 0 0 0  Down, Depressed, Hopeless 0 0 0  PHQ - 2 Score 0 0 0  Altered sleeping - - -  Tired, decreased energy - - -  Change in appetite - - -  Feeling bad or failure about yourself  - - -  Trouble concentrating - - -  Moving slowly or fidgety/restless - - -  Suicidal thoughts - - -  PHQ-9 Score - - -  Difficult doing work/chores - - -    Allergies  Allergen Reactions  . Orange Fruit [Citrus] Hives  . Penicillins Hives    Prior to Admission medications   Medication Sig Start Date End Date Taking? Authorizing Provider  albuterol (PROVENTIL HFA;VENTOLIN HFA) 108 (90 Base) MCG/ACT inhaler Inhale 2 puffs into the lungs every 6 (six) hours as needed for wheezing or shortness of breath. 01/14/18  Yes Rutherford Guys, MD  amLODipine (NORVASC) 5 MG tablet Take 1 tablet (5 mg total) by mouth daily. 11/14/17  Yes Rutherford Guys, MD  doxycycline (VIBRA-TABS) 100 MG tablet Take 1 tablet (100 mg total) by mouth 2 (two) times daily. 01/14/18  Yes Rutherford Guys, MD  glyBURIDE (DIABETA) 5 MG tablet Take 1 tablet (5 mg total) by mouth daily. Take with meal prior to going to work 11/14/17  Yes Pamella Pert, Lilia Argue, MD  hydrocortisone cream 1 % Apply to affected area 2 times daily 06/06/15  Yes  Joy, Shawn C, PA-C  metFORMIN (GLUCOPHAGE-XR) 500 MG 24 hr tablet Take 1 tablet (500 mg total) by mouth daily. With meal prior to going to work 11/14/17  Yes Pamella Pert, Lilia Argue, MD  mupirocin ointment (BACTROBAN) 2 % Apply 1 application topically 3 (three) times daily. 11/14/17  Yes Rutherford Guys, MD  ondansetron (ZOFRAN) 4 MG tablet Take 1 tablet (4 mg total) by mouth every 8 (eight) hours as needed for nausea or vomiting. 07/23/17  Yes Rutherford Guys, MD  pantoprazole (PROTONIX) 20 MG tablet Take 1 tablet (20 mg total) by mouth daily. 09/20/17  Yes Rutherford Guys, MD    Past Medical History:  Diagnosis Date  . Allergy   . Diabetes mellitus without complication ([place])   . Hypertension     Past Surgical History:  Procedure Laterality Date  . ABDOMINAL HYSTERECTOMY     PID; ovaries resected.  Age 82.  Marland Kitchen APPENDECTOMY      Social History   Tobacco Use  . Smoking status: Former Smoker    Packs/day: 0.70    Years: 30.00    Pack years: 21.00    Types: Cigarettes  . Smokeless tobacco: Never Used  Substance Use Topics  . Alcohol use: No    Family History  Problem Relation Age of Onset  .  Cancer Mother 69       uterine cancer  . Heart disease Sister 51       AMI/CAD with stenting  . Hypertension Sister   . Breast cancer Maternal Aunt     ROS Per hpi  OBJECTIVE:  Blood pressure 115/76, pulse 96, temperature 99.2 F (37.3 C), temperature source Oral, height 5\' 3"  (1.6 m), weight 212 lb (96.2 kg), SpO2 100 %. Body mass index is 37.55 kg/m.   Physical Exam  Constitutional: She is oriented to person, place, and time. She appears well-developed and well-nourished.  HENT:  Head: Normocephalic and atraumatic.  Mouth/Throat: Oropharynx is clear and moist. No oropharyngeal exudate.  Eyes: Pupils are equal, round, and reactive to light. Conjunctivae and EOM are normal. No scleral icterus.  Neck: Neck supple.  Cardiovascular: Normal rate, regular rhythm and normal heart sounds.  Exam reveals no gallop and no friction rub.  No murmur heard. Pulmonary/Chest: Effort normal. She has wheezes (much imporved, minimal wheezing RUL). She has no rales.  Musculoskeletal: She exhibits no edema.  Neurological: She is alert and oriented to person, place, and time.  Skin: Skin is warm and dry.  Psychiatric: She has a normal mood and affect.  Nursing note and vitals reviewed.    ASSESSMENT and PLAN  1. Wheezing Much improved. Cont abx, albuterol prn, OTC meds as needed  Return if symptoms worsen or fail to improve.    Rutherford Guys, MD Primary Care at [place], [place] [address] Ph.  [phone] Fax [phone]

## 2018-01-16 NOTE — Patient Instructions (Signed)
° ° ° °  If you have lab work done today you will be contacted with your lab results within the next 2 weeks.  If you have not heard from us then please contact us. The fastest way to get your results is to register for My Chart. ° ° °IF you received an x-ray today, you will receive an invoice from [facility]. Please contact [facility] at [phone] with questions or concerns regarding your invoice.  ° °IF you received labwork today, you will receive an invoice from [facility]. Please contact [facility] at [phone] with questions or concerns regarding your invoice.  ° °Our billing staff will not be able to assist you with questions regarding bills from these companies. ° °You will be contacted with the lab results as soon as they are available. The fastest way to get your results is to activate your My Chart account. Instructions are located on the last page of this paperwork. If you have not heard from us regarding the results in 2 weeks, please contact this office. °  ° ° ° °

## 2018-02-06 ENCOUNTER — Other Ambulatory Visit: Payer: Self-pay | Admitting: Family Medicine

## 2018-02-06 NOTE — Telephone Encounter (Signed)
Copied from [place] [id number]. Topic: Quick Communication - Rx Refill/Question >> Feb 06, 2018 10:16 AM Percell Belt A wrote: Medication: pantoprazole (PROTONIX) 20 MG tablet [[id number]]    Has the patient contacted their pharmacy?no  (Agent: If no, request that the patient contact the pharmacy for the refill.) (Agent: If yes, when and what did the pharmacy advise?)  Preferred Pharmacy (with phone number or street name): [facility], [place]. [phone] (Phone)   Agent: Please be advised that RX refills may take up to 3 business days. We ask that you follow-up with your pharmacy.

## 2018-02-06 NOTE — Telephone Encounter (Signed)
Pantoprazole 20 mg refill Last Refill:09/20/17 # 30  And 2 refills Last OV: 01/16/18 PCP: I. Elizabeth: [facility] # [phone]

## 2018-02-14 ENCOUNTER — Ambulatory Visit: Payer: BLUE CROSS/BLUE SHIELD | Admitting: Family Medicine

## 2018-07-15 ENCOUNTER — Ambulatory Visit: Payer: BLUE CROSS/BLUE SHIELD | Admitting: Family Medicine

## 2018-07-15 ENCOUNTER — Other Ambulatory Visit: Payer: Self-pay

## 2018-07-15 ENCOUNTER — Ambulatory Visit (INDEPENDENT_AMBULATORY_CARE_PROVIDER_SITE_OTHER): Payer: BLUE CROSS/BLUE SHIELD

## 2018-07-15 ENCOUNTER — Encounter: Payer: Self-pay | Admitting: Family Medicine

## 2018-07-15 VITALS — BP 123/85 | HR 86 | Temp 98.5°F | Ht 63.0 in | Wt 213.0 lb

## 2018-07-15 DIAGNOSIS — M25512 Pain in left shoulder: Secondary | ICD-10-CM

## 2018-07-15 MED ORDER — HYDROCODONE-ACETAMINOPHEN 5-325 MG PO TABS: 1.0000 | ORAL_TABLET | Freq: Four times a day (QID) | ORAL | 0 refills | Status: DC | PRN

## 2018-07-15 MED ORDER — MELOXICAM 15 MG PO TABS: 15.0000 mg | ORAL_TABLET | Freq: Every day | ORAL | 0 refills | Status: DC

## 2018-07-15 MED ORDER — CYCLOBENZAPRINE HCL 10 MG PO TABS: 10.0000 mg | ORAL_TABLET | Freq: Every day | ORAL | 0 refills | Status: DC

## 2018-07-15 NOTE — Patient Instructions (Signed)
° ° ° °  If you have lab work done today you will be contacted with your lab results within the next 2 weeks.  If you have not heard from us then please contact us. The fastest way to get your results is to register for My Chart. ° ° °IF you received an x-ray today, you will receive an invoice from [facility]. Please contact [facility] at [phone] with questions or concerns regarding your invoice.  ° °IF you received labwork today, you will receive an invoice from [facility]. Please contact [facility] at [phone] with questions or concerns regarding your invoice.  ° °Our billing staff will not be able to assist you with questions regarding bills from these companies. ° °You will be contacted with the lab results as soon as they are available. The fastest way to get your results is to activate your My Chart account. Instructions are located on the last page of this paperwork. If you have not heard from us regarding the results in 2 weeks, please contact this office. °  ° ° ° °

## 2018-07-15 NOTE — Progress Notes (Signed)
2/25/202010:49 AM  Karen [name] Nov 11, 1960, 58 y.o. female [id number]  Chief Complaint  Patient presents with  . 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 Asking for a few days off from work just to rest from lifting pts. Has been having pain since November. Understands that she is due for dm testing, says she will adress on next appt    HPI:   Patient is a 58 y.o. female with past medical history significant for DM2, HTN, HLP who presents today for left shoulder pain  Started nov 2019 No inciting event, no trauma Worse on rainy or cold days Hurts most when she tries to reach over head or behind her back She works in Physicist, medical transferring patients, which really causes pain to flare up, almost dropped a patient due to pain Denies any previous issues Having some numbness or tingling on her fingers Denies any neck pain Takes tylenol which does not help Unable to take ibuprofen due GI issues  Fall Risk  07/15/2018 01/16/2018 01/14/2018 11/14/2017 07/23/2017  Falls in the past year? 0 No No No No  Number falls in past yr: 0 - - - -  Injury with Fall? 0 - - - -     Depression screen [facility] 2/9 07/15/2018 01/16/2018 01/14/2018  Decreased Interest 0 0 0  Down, Depressed, Hopeless 0 0 0  PHQ - 2 Score 0 0 0  Altered sleeping - - -  Tired, decreased energy - - -  Change in appetite - - -  Feeling bad or failure about yourself  - - -  Trouble concentrating - - -  Moving slowly or fidgety/restless - - -  Suicidal thoughts - - -  PHQ-9 Score - - -  Difficult doing work/chores - - -    Allergies  Allergen Reactions  . Orange Fruit [Citrus] Hives  . Penicillins Hives    Prior to Admission medications   Medication Sig Start Date End Date Taking? Authorizing Provider  amLODipine (NORVASC) 5 MG tablet Take 1 tablet (5 mg total) by mouth daily. 11/14/17  Yes Rutherford Guys, MD  doxycycline (VIBRA-TABS) 100 MG tablet Take 1 tablet (100 mg total) by  mouth 2 (two) times daily. 01/14/18  Yes Rutherford Guys, MD  glyBURIDE (DIABETA) 5 MG tablet Take 1 tablet (5 mg total) by mouth daily. Take with meal prior to going to work 11/14/17  Yes Pamella Pert, Lilia Argue, MD  metFORMIN (GLUCOPHAGE-XR) 500 MG 24 hr tablet Take 1 tablet (500 mg total) by mouth daily. With meal prior to going to work 11/14/17  Yes Pamella Pert, Lilia Argue, MD  pantoprazole (PROTONIX) 20 MG tablet TAKE 1 TABLET BY MOUTH ONCE DAILY 02/06/18  Yes Rutherford Guys, MD  albuterol (PROVENTIL HFA;VENTOLIN HFA) 108 (90 Base) MCG/ACT inhaler Inhale 2 puffs into the lungs every 6 (six) hours as needed for wheezing or shortness of breath. Patient not taking: Reported on 07/15/2018 01/14/18   Rutherford Guys, MD  cetirizine (ZYRTEC) 10 MG tablet Take 10 mg by mouth daily. 02/15/18   [provider]    Past Medical History:  Diagnosis Date  . Allergy   . Diabetes mellitus without complication ([place])   . Hypertension     Past Surgical History:  Procedure Laterality Date  . ABDOMINAL HYSTERECTOMY     PID; ovaries resected.  Age 18.  Marland Kitchen APPENDECTOMY      Social History   Tobacco Use  .  Smoking status: Former Smoker    Packs/day: 0.70    Years: 30.00    Pack years: 21.00    Types: Cigarettes  . Smokeless tobacco: Never Used  Substance Use Topics  . Alcohol use: No    Family History  Problem Relation Age of Onset  . Cancer Mother 30       uterine cancer  . Heart disease Sister 27       AMI/CAD with stenting  . Hypertension Sister   . Breast cancer Maternal Aunt     ROS Per hpi  OBJECTIVE:  Blood pressure 123/85, pulse 86, temperature 98.5 F (36.9 C), temperature source Oral, height 5\' 3"  (1.6 m), weight 213 lb (96.6 kg), SpO2 98 %. Body mass index is 37.73 kg/m.   Physical Exam Vitals signs and nursing note reviewed.  Constitutional:      Appearance: She is well-developed.  HENT:     Head: Normocephalic and atraumatic.  Eyes:     General: No scleral icterus.     Conjunctiva/sclera: Conjunctivae normal.     Pupils: Pupils are equal, round, and reactive to light.  Neck:     Musculoskeletal: Neck supple.  Pulmonary:     Effort: Pulmonary effort is normal.  Musculoskeletal:     Right shoulder: She exhibits decreased range of motion (about 110 degrees of flexion and abduction, internal rotation to about 30 degrees), tenderness (mostly over deltoid), swelling, pain and spasm (upper trapezius). She exhibits no bony tenderness, no crepitus, normal pulse and normal strength.  Skin:    General: Skin is warm and dry.  Neurological:     Mental Status: She is alert and oriented to person, place, and time.    Positive drop arch test Positive empty can test Positive hawkins  Dg Shoulder Left  Result Date: 07/15/2018 CLINICAL DATA:  Left shoulder pain. EXAM: LEFT SHOULDER - 2+ VIEW COMPARISON:  I am chest x-ray 12/03/2013. FINDINGS: No acute bony or joint abnormality identified. No evidence of fracture or dislocation. IMPRESSION: No acute abnormality. Electronically Signed   By: Marcello Moores  Register   On: 07/15/2018 11:21     ASSESSMENT and PLAN  1. Pain in joint of left shoulder Exam suggestive of rotator cuff pathology with possible bursitis. Discussed RICE therapy, new meds r/se/b reviewed. Referring to ortho. Work excuse given  - DG Shoulder Left; Future - Ambulatory referral to Orthopedic Surgery  Other orders - HYDROcodone-acetaminophen (NORCO/VICODIN) 5-325 MG tablet; Take 1 tablet by mouth every 6 (six) hours as needed for moderate pain. - cyclobenzaprine (FLEXERIL) 10 MG tablet; Take 1 tablet (10 mg total) by mouth at bedtime. - meloxicam (MOBIC) 15 MG tablet; Take 1 tablet (15 mg total) by mouth daily.  Return in about 4 weeks (around 08/12/2018) for for routine medical conditions.    Rutherford Guys, MD Primary Care at [facility] [place], [place] [address] Ph.  [phone] Fax [phone]

## 2018-07-16 ENCOUNTER — Telehealth: Payer: Self-pay | Admitting: Family Medicine

## 2018-07-16 NOTE — Telephone Encounter (Signed)
Copied from [place] [id number]. Topic: General - Other >> Jul 16, 2018  4:28 PM Ivar Drape wrote: Reason for CRM:   Patient would like a return call from Dr. Ardyth Gal assistant concerning her out of work letter

## 2018-07-23 NOTE — Telephone Encounter (Signed)
Pt states she has already RTW.  Will be seeing ortho on 03/10.  No further needs or questions at this time.

## 2018-07-25 ENCOUNTER — Other Ambulatory Visit: Payer: Self-pay | Admitting: Family Medicine

## 2018-07-25 MED ORDER — CETIRIZINE HCL 10 MG PO TABS: 10.0000 mg | ORAL_TABLET | Freq: Every day | ORAL | 0 refills | Status: DC

## 2018-07-25 NOTE — Telephone Encounter (Signed)
Requested medication (s) are due for refill today: yes per pt  Requested medication (s) are on the active medication list: yes- historical provider  Last refill:  07/15/18 amount dispensed not specified  Future visit scheduled: yes  Notes to clinic:  Historical medication   Requested Prescriptions  Pending Prescriptions Disp Refills   cetirizine (ZYRTEC) 10 MG tablet      Sig: Take 1 tablet (10 mg total) by mouth daily.     Ear, Nose, and Throat:  Antihistamines Passed - 07/25/2018  9:03 AM      Passed - Valid encounter within last 12 months    Recent Outpatient Visits          1 week ago Pain in joint of left shoulder   [facility], Lilia Argue, MD   6 months ago Wheezing   [facility], Lilia Argue, MD   6 months ago Acute non-recurrent maxillary sinusitis   [facility], Lilia Argue, MD   8 months ago Type 2 diabetes mellitus without complication, without long-term current use of insulin [facility])   [facility], Lilia Argue, MD   1 year ago Type 2 diabetes mellitus without complication, without long-term current use of insulin [facility])   [facility], Lilia Argue, MD      Future Appointments            In 2 weeks Rutherford Guys, MD [facility] at [place], [facility]

## 2018-07-25 NOTE — Telephone Encounter (Signed)
Copied from [place] [id number]. Topic: Quick Communication - Rx Refill/Question >> Jul 25, 2018  8:54 AM Carolyn Stare wrote: Medication    cetirizine (ZYRTEC) 10 MG tablet  RX HAS EXPIRED   Preferred Pharmacy  [facility] [place] [facility]   Agent: Please be advised that RX refills may take up to 3 business days. We ask that you follow-up with your pharmacy.

## 2018-07-29 ENCOUNTER — Ambulatory Visit (INDEPENDENT_AMBULATORY_CARE_PROVIDER_SITE_OTHER): Payer: BLUE CROSS/BLUE SHIELD | Admitting: Orthopedic Surgery

## 2018-08-05 ENCOUNTER — Ambulatory Visit (INDEPENDENT_AMBULATORY_CARE_PROVIDER_SITE_OTHER): Payer: BLUE CROSS/BLUE SHIELD | Admitting: Orthopaedic Surgery

## 2018-08-14 ENCOUNTER — Other Ambulatory Visit: Payer: Self-pay

## 2018-08-14 ENCOUNTER — Telehealth (INDEPENDENT_AMBULATORY_CARE_PROVIDER_SITE_OTHER): Payer: BLUE CROSS/BLUE SHIELD | Admitting: Family Medicine

## 2018-08-14 ENCOUNTER — Telehealth: Payer: Self-pay | Admitting: Family Medicine

## 2018-08-14 DIAGNOSIS — E785 Hyperlipidemia, unspecified: Secondary | ICD-10-CM

## 2018-08-14 DIAGNOSIS — E119 Type 2 diabetes mellitus without complications: Secondary | ICD-10-CM | POA: Diagnosis not present

## 2018-08-14 DIAGNOSIS — I10 Essential (primary) hypertension: Secondary | ICD-10-CM | POA: Diagnosis not present

## 2018-08-14 MED ORDER — CYCLOBENZAPRINE HCL 10 MG PO TABS: 10.0000 mg | ORAL_TABLET | Freq: Every day | ORAL | 2 refills | Status: DC

## 2018-08-14 MED ORDER — GLYBURIDE 5 MG PO TABS: 5.0000 mg | ORAL_TABLET | Freq: Every day | ORAL | 0 refills | Status: DC

## 2018-08-14 MED ORDER — METFORMIN HCL ER 500 MG PO TB24: 500.0000 mg | ORAL_TABLET | Freq: Every day | ORAL | 0 refills | Status: DC

## 2018-08-14 MED ORDER — PANTOPRAZOLE SODIUM 20 MG PO TBEC: 20.0000 mg | DELAYED_RELEASE_TABLET | Freq: Every day | ORAL | 0 refills | Status: DC

## 2018-08-14 MED ORDER — MELOXICAM 15 MG PO TABS: 15.0000 mg | ORAL_TABLET | Freq: Every day | ORAL | 2 refills | Status: DC

## 2018-08-14 MED ORDER — AMLODIPINE BESYLATE 5 MG PO TABS: 5.0000 mg | ORAL_TABLET | Freq: Every day | ORAL | 1 refills | Status: DC

## 2018-08-14 NOTE — Telephone Encounter (Signed)
Called patient to schedule for Labs for Monday 08/18/18 and she will also need a 3 month f/u  LVM  08/14/18

## 2018-08-14 NOTE — Progress Notes (Signed)
6 mos hypertension f/u having some hot flashes, wanting to know what she can do about that. Has an appt with ortho on the 1st of April. Wants to make sure all medications are refilled for the yr. all medications have been verified. Says she has been monitoring her bp, numbers have been fine. Says she would rather wait until later to get blood work done, does not want to come into the office at this time.

## 2018-08-14 NOTE — Progress Notes (Signed)
Virtual Visit via telephone Note  I connected with patient on 08/14/18 at 927am by telephone and verified that I am speaking with the correct person using two identifiers. Karen [name] is currently located at home and patient is currently with her during visit. The provider, Rutherford Guys, MD is located in their office at time of visit.  I discussed the limitations, risks, security and privacy concerns of performing an evaluation and management service by telephone and the availability of in person appointments. I also discussed with the patient that there may be a patient responsible charge related to this service. The patient expressed understanding and agreed to proceed.  CC: med refill  Telephone visit today for routine followup: DM2, HTN, HLP, GERD, LEFT shoulder pain  HPI Last OV in June 2019 Not checking cbgs Taking metformin and glyburide daily Denies any polydipsia and polyuria Trying work on diet, drinking more water, trying to eat more fruits and veggies Denies any blurry or double vision, had recent eye exam at [facility] and per patient no retinopathy Checks BP at work: reports good readings 120/70s.  Denies any CP, palpitations, edema Sees ortho next week, still taking meloxicam and flexeril, requesting refills as they are really helping shoulder, not as swollen, able to move better? gerd well controlled on daily PPI  Lab Results  Component Value Date   HGBA1C 9.0 (A) 11/14/2017   HGBA1C 8.0 07/23/2017   HGBA1C 9.6 (H) 04/19/2017   Lab Results  Component Value Date   MICROALBUR 1.3 11/26/2014   LDLCALC 97 11/14/2017   CREATININE 0.75 11/14/2017   Wt Readings from Last 3 Encounters:  07/15/18 213 lb (96.6 kg)  01/16/18 212 lb (96.2 kg)  01/14/18 212 lb 9.6 oz (96.4 kg)    Fall Risk  07/15/2018 01/16/2018 01/14/2018 11/14/2017 07/23/2017  Falls in the past year? 0 No No No No  Number falls in past yr: 0 - - - -  Injury with Fall? 0 - - - -      Depression screen [facility] 2/9 07/15/2018 01/16/2018 01/14/2018  Decreased Interest 0 0 0  Down, Depressed, Hopeless 0 0 0  PHQ - 2 Score 0 0 0  Altered sleeping - - -  Tired, decreased energy - - -  Change in appetite - - -  Feeling bad or failure about yourself  - - -  Trouble concentrating - - -  Moving slowly or fidgety/restless - - -  Suicidal thoughts - - -  PHQ-9 Score - - -  Difficult doing work/chores - - -    Allergies  Allergen Reactions  . Orange Fruit [Citrus] Hives  . Penicillins Hives    Prior to Admission medications   Medication Sig Start Date End Date Taking? Authorizing Provider  albuterol (PROVENTIL HFA;VENTOLIN HFA) 108 (90 Base) MCG/ACT inhaler Inhale 2 puffs into the lungs every 6 (six) hours as needed for wheezing or shortness of breath. Patient not taking: Reported on 07/15/2018 01/14/18   Rutherford Guys, MD  amLODipine (NORVASC) 5 MG tablet Take 1 tablet (5 mg total) by mouth daily. 11/14/17   Rutherford Guys, MD  cetirizine (ZYRTEC) 10 MG tablet Take 1 tablet (10 mg total) by mouth daily. 07/25/18   Forrest Moron, MD  cyclobenzaprine (FLEXERIL) 10 MG tablet Take 1 tablet (10 mg total) by mouth at bedtime. 07/15/18   Rutherford Guys, MD  doxycycline (VIBRA-TABS) 100 MG tablet Take 1 tablet (100 mg total) by mouth 2 (two)  times daily. 01/14/18   Rutherford Guys, MD  glyBURIDE (DIABETA) 5 MG tablet Take 1 tablet (5 mg total) by mouth daily. Take with meal prior to going to work 11/14/17   Rutherford Guys, MD  HYDROcodone-acetaminophen (NORCO/VICODIN) 5-325 MG tablet Take 1 tablet by mouth every 6 (six) hours as needed for moderate pain. 07/15/18   Rutherford Guys, MD  meloxicam (MOBIC) 15 MG tablet Take 1 tablet (15 mg total) by mouth daily. 07/15/18   Rutherford Guys, MD  metFORMIN (GLUCOPHAGE-XR) 500 MG 24 hr tablet Take 1 tablet (500 mg total) by mouth daily. With meal prior to going to work 11/14/17   Rutherford Guys, MD  pantoprazole (PROTONIX) 20 MG tablet  TAKE 1 TABLET BY MOUTH ONCE DAILY 02/06/18   Rutherford Guys, MD    Past Medical History:  Diagnosis Date  . Allergy   . Diabetes mellitus without complication ([place])   . Hypertension     Past Surgical History:  Procedure Laterality Date  . ABDOMINAL HYSTERECTOMY     PID; ovaries resected.  Age 39.  Marland Kitchen APPENDECTOMY      Social History   Tobacco Use  . Smoking status: Former Smoker    Packs/day: 0.70    Years: 30.00    Pack years: 21.00    Types: Cigarettes  . Smokeless tobacco: Never Used  Substance Use Topics  . Alcohol use: No    Family History  Problem Relation Age of Onset  . Cancer Mother 74       uterine cancer  . Heart disease Sister 44       AMI/CAD with stenting  . Hypertension Sister   . Breast cancer Maternal Aunt     ROS Per hpi  Objective  Vitals as reported by the patient: 120/72  There were no vitals filed for this visit.  ASSESSMENT and PLAN 1. Type 2 diabetes mellitus without complication, without long-term current use of insulin ([place]) Per last a1c, uncontrolled. Checking labs today, medications will be adjusted as needed.  - Hemoglobin A1c; Future - Microalbumin/Creatinine Ratio, Urine; Future  2. Essential hypertension Controlled. Continue current regime.  - Comprehensive metabolic panel; Future  3. Dyslipidemia Checking labs today, medications will be adjusted as needed. Consider starting statin as she is DM2 - Lipid panel; Future  Other orders - pantoprazole (PROTONIX) 20 MG tablet; Take 1 tablet (20 mg total) by mouth daily. - metFORMIN (GLUCOPHAGE-XR) 500 MG 24 hr tablet; Take 1 tablet (500 mg total) by mouth daily. With meal prior to going to work - meloxicam (MOBIC) 15 MG tablet; Take 1 tablet (15 mg total) by mouth daily. - glyBURIDE (DIABETA) 5 MG tablet; Take 1 tablet (5 mg total) by mouth daily. Take with meal prior to going to work - cyclobenzaprine (FLEXERIL) 10 MG tablet; Take 1 tablet (10 mg total) by mouth at bedtime.  - amLODipine (NORVASC) 5 MG tablet; Take 1 tablet (5 mg total) by mouth daily.  FOLLOW-UP: next week fasting labs, 3 month for routine followup on DM2, HTN   The above assessment and management plan was discussed with the patient. The patient verbalized understanding of and has agreed to the management plan. Patient is aware to call the clinic if symptoms persist or worsen. Patient is aware when to return to the clinic for a follow-up visit. Patient educated on when it is appropriate to go to the emergency department.    I provided 13 minutes of non-face-to-face time during this  encounter.  Rutherford Guys, MD Primary Care at [facility], [place] [address] Ph.  [phone] Fax [phone]

## 2018-08-18 ENCOUNTER — Other Ambulatory Visit: Payer: Self-pay

## 2018-08-18 ENCOUNTER — Encounter (INDEPENDENT_AMBULATORY_CARE_PROVIDER_SITE_OTHER): Payer: Self-pay | Admitting: Orthopaedic Surgery

## 2018-08-18 ENCOUNTER — Ambulatory Visit (INDEPENDENT_AMBULATORY_CARE_PROVIDER_SITE_OTHER): Payer: BLUE CROSS/BLUE SHIELD | Admitting: Orthopaedic Surgery

## 2018-08-18 VITALS — BP 124/95 | HR 94 | Ht 63.0 in | Wt 210.0 lb

## 2018-08-18 DIAGNOSIS — G8929 Other chronic pain: Secondary | ICD-10-CM | POA: Diagnosis not present

## 2018-08-18 DIAGNOSIS — M25512 Pain in left shoulder: Secondary | ICD-10-CM

## 2018-08-18 MED ORDER — METHYLPREDNISOLONE ACETATE 40 MG/ML IJ SUSP
80.0000 mg | INTRAMUSCULAR | Status: AC | PRN
  Administered 2018-08-18: 80 mg via INTRA_ARTICULAR

## 2018-08-18 MED ORDER — BUPIVACAINE HCL 0.5 % IJ SOLN
2.0000 mL | INTRAMUSCULAR | Status: AC | PRN
  Administered 2018-08-18: 2 mL via INTRA_ARTICULAR

## 2018-08-18 MED ORDER — LIDOCAINE HCL 2 % IJ SOLN
2.0000 mL | INTRAMUSCULAR | Status: AC | PRN
  Administered 2018-08-18: 2 mL

## 2018-08-18 NOTE — Progress Notes (Signed)
Office Visit Note   Patient: Karen [name]           Date of Birth: [birth year]           MRN: [id number] Visit Date: 08/18/2018              Requested by: Rutherford Guys, MD [address] [place], [place] [address] PCP: Rutherford Guys, MD   Assessment & Plan: Visit Diagnoses:  1. Chronic left shoulder pain     Plan: Impingement syndrome left shoulder with possible rotator cuff tear.  Will inject subacromial space with cortisone and monitor response.  Consider MRI scan over the next 4 weeks or so if still symptomatic  Follow-Up Instructions: Return if symptoms worsen or fail to improve.   Orders:  No orders of the defined types were placed in this encounter.  No orders of the defined types were placed in this encounter.     Procedures: Large Joint Inj: L subacromial bursa on 08/18/2018 9:28 AM Indications: pain and diagnostic evaluation Details: 25 G 1.5 in needle, anterolateral approach  Arthrogram: No  Medications: 2 mL lidocaine 2 %; 2 mL bupivacaine 0.5 %; 80 mg methylPREDNISolone acetate 40 MG/ML Consent was given by the patient. Immediately prior to procedure a time out was called to verify the correct patient, procedure, equipment, support staff and site/side marked as required. Patient was prepped and draped in the usual sterile fashion.       Clinical Data: No additional findings.   Subjective: Chief Complaint  Patient presents with  . Left Shoulder - Pain  Patient presents today with left shoulder pain. She said that it has been hurting for 4 months. She said that the pain is present when it rains or is cold outside. She said that it hurts all throughout her shoulder, and occasionally radiates down her arm. No numbness or tingling in her hands. Somedays she has difficulty raising her arm above her head, and also noticed occasional weakness. She is right hand dominant. She saw her PCP and had x-rays taken on 07/15/18 . She was given meloxicam, flexeril,  and hydrocodone.  Films of her left shoulder reviewed on the PACS system dated 07/15/2018.  No acute changes.  Very small subchondral cysts in the superior humeral head but the joint space was well-maintained.  Humeral head is centered about the glenoid.  No ectopic calcification  HPI  Review of Systems  Constitutional: Negative for fatigue.  HENT: Negative for ear pain.   Eyes: Negative for pain.  Respiratory: Negative for shortness of breath.   Cardiovascular: Negative for leg swelling.  Gastrointestinal: Negative for constipation and diarrhea.  Endocrine: Negative for cold intolerance and heat intolerance.  Genitourinary: Negative for difficulty urinating.  Musculoskeletal: Negative for joint swelling.  Skin: Negative for rash.  Allergic/Immunologic: Positive for food allergies.  Neurological: Negative for weakness.  Hematological: Does not bruise/bleed easily.  Psychiatric/Behavioral: Negative for sleep disturbance.     Objective: Vital Signs: BP (!) 124/95   Pulse 94   Ht 5\' 3"  (1.6 m)   Wt 210 lb (95.3 kg)   BMI 37.20 kg/m   Physical Exam Constitutional:      Appearance: She is well-developed.  Eyes:     Pupils: Pupils are equal, round, and reactive to light.  Pulmonary:     Effort: Pulmonary effort is normal.  Skin:    General: Skin is warm and dry.  Neurological:     Mental Status: She is alert and oriented  to person, place, and time.  Psychiatric:        Behavior: Behavior normal.     Ortho Exam awake alert and oriented x3.  Comfortable sitting.  Able to place left arm fully overhead with somewhat of a circuitous motion.  Positive impingement and extreme of internal and external rotation.  Positive empty can test.  Biceps intact.  Skin intact.  Good grip and release.  No pain with range of motion of cervical spine.  Some tenderness in the area of the biceps tendon and the anterior subacromial region.  No grating or grinding  Specialty Comments:  No specialty  comments available.  Imaging: No results found.   PMFS History: Patient Active Problem List   Diagnosis Date Noted  . Seasonal allergies 04/19/2017  . Gastroesophageal reflux disease 04/19/2017  . Diabetes mellitus ([place]) 02/14/2012  . Hypertension 02/14/2012   Past Medical History:  Diagnosis Date  . Allergy   . Diabetes mellitus without complication ([place])   . Hypertension     Family History  Problem Relation Age of Onset  . Cancer Mother 39       uterine cancer  . Heart disease Sister 44       AMI/CAD with stenting  . Hypertension Sister   . Breast cancer Maternal Aunt     Past Surgical History:  Procedure Laterality Date  . ABDOMINAL HYSTERECTOMY     PID; ovaries resected.  Age 58.  Marland Kitchen APPENDECTOMY     Social History   Occupational History  . Not on file  Tobacco Use  . Smoking status: Former Smoker    Packs/day: 0.70    Years: 30.00    Pack years: 21.00    Types: Cigarettes  . Smokeless tobacco: Never Used  Substance and Sexual Activity  . Alcohol use: No  . Drug use: No  . Sexual activity: Yes    Birth control/protection: None

## 2018-08-20 ENCOUNTER — Ambulatory Visit (INDEPENDENT_AMBULATORY_CARE_PROVIDER_SITE_OTHER): Payer: BLUE CROSS/BLUE SHIELD | Admitting: Orthopaedic Surgery

## 2018-10-02 ENCOUNTER — Telehealth: Payer: Self-pay | Admitting: Orthopaedic Surgery

## 2018-10-02 NOTE — Telephone Encounter (Signed)
Patient calling in regards to lt shoulder. Patient received an injection at last ov. Per patient, injected helped a lot for about 1 month. Pain started coming back last pm. Pain in shoulder, and up neck, down to elbow now. Patient does have muscle relaxes and Meloxicam from GP, but they do not help this pain. Patient uses Paediatric nurse on [address]. Please call to advise. Patient knows Dr. Durward Fortes is out of the office.

## 2018-10-03 ENCOUNTER — Other Ambulatory Visit: Payer: Self-pay | Admitting: Orthopedic Surgery

## 2018-10-03 MED ORDER — TRAMADOL HCL 50 MG PO TABS: 50.0000 mg | ORAL_TABLET | Freq: Four times a day (QID) | ORAL | 0 refills | Status: DC | PRN

## 2018-10-03 NOTE — Telephone Encounter (Signed)
Please advise 

## 2018-10-03 NOTE — Telephone Encounter (Signed)
Sent in rx for tramadol  Patient called to let her know

## 2018-10-16 ENCOUNTER — Other Ambulatory Visit: Payer: Self-pay | Admitting: Family Medicine

## 2018-10-16 NOTE — Telephone Encounter (Signed)
Copied from [place] [id number]. Topic: Quick Communication - Rx Refill/Question >> Oct 16, 2018  8:54 AM Keene Breath wrote: Medication: amLODipine (NORVASC) 5 MG tablet  Patient called to request  refill for the above.  Patient is all out of medication  Preferred Pharmacy (with phone number or street name): [facility] [place]. [phone] (Phone) [phone] (Fax)

## 2018-10-17 MED ORDER — AMLODIPINE BESYLATE 5 MG PO TABS: 5.0000 mg | ORAL_TABLET | Freq: Every day | ORAL | 1 refills | Status: DC

## 2018-10-17 NOTE — Telephone Encounter (Signed)
Norvasc has been sent to pharmacy

## 2018-11-12 ENCOUNTER — Telehealth: Payer: Self-pay | Admitting: Family Medicine

## 2018-11-12 NOTE — Telephone Encounter (Signed)
Pt received a letter from [facility] stating there is a recall on her metformin. Pt has stop taking it and need to know what to do

## 2018-11-13 NOTE — Telephone Encounter (Signed)
Last OV march - telemedicine Last labs from a year ago Patient needs an OV, she also needs to come in for labs Medication will not be refilled

## 2018-11-14 NOTE — Telephone Encounter (Signed)
Spoke with pt let her know that she has labs pending and set her up an appt for 11/28/2018 for med refills FR

## 2018-11-28 ENCOUNTER — Ambulatory Visit: Payer: BLUE CROSS/BLUE SHIELD | Admitting: Family Medicine

## 2018-12-23 ENCOUNTER — Ambulatory Visit (INDEPENDENT_AMBULATORY_CARE_PROVIDER_SITE_OTHER): Payer: BC Managed Care – PPO | Admitting: Family Medicine

## 2018-12-23 ENCOUNTER — Other Ambulatory Visit: Payer: Self-pay

## 2018-12-23 DIAGNOSIS — E119 Type 2 diabetes mellitus without complications: Secondary | ICD-10-CM

## 2018-12-23 DIAGNOSIS — I10 Essential (primary) hypertension: Secondary | ICD-10-CM

## 2018-12-23 DIAGNOSIS — E785 Hyperlipidemia, unspecified: Secondary | ICD-10-CM

## 2018-12-24 LAB — COMPREHENSIVE METABOLIC PANEL
ALT: 16 IU/L (ref 0–32)
AST: 15 IU/L (ref 0–40)
Albumin/Globulin Ratio: 1.1 — ABNORMAL LOW (ref 1.2–2.2)
Albumin: 4.1 g/dL (ref 3.8–4.9)
Alkaline Phosphatase: 137 IU/L — ABNORMAL HIGH (ref 39–117)
BUN/Creatinine Ratio: 10 (ref 9–23)
BUN: 9 mg/dL (ref 6–24)
Bilirubin Total: 0.3 mg/dL (ref 0.0–1.2)
CO2: 26 mmol/L (ref 20–29)
Calcium: 9.5 mg/dL (ref 8.7–10.2)
Chloride: 99 mmol/L (ref 96–106)
Creatinine, Ser: 0.89 mg/dL (ref 0.57–1.00)
GFR calc Af Amer: 84 mL/min/{1.73_m2} (ref >59)
GFR calc non Af Amer: 73 mL/min/{1.73_m2} (ref >59)
Globulin, Total: 3.7 g/dL (ref 1.5–4.5)
Glucose: 211 mg/dL — ABNORMAL HIGH (ref 65–99)
Potassium: 4.2 mmol/L (ref 3.5–5.2)
Sodium: 142 mmol/L (ref 134–144)
Total Protein: 7.8 g/dL (ref 6.0–8.5)

## 2018-12-24 LAB — MICROALBUMIN / CREATININE URINE RATIO
Creatinine, Urine: 194.8 mg/dL
Microalb/Creat Ratio: 9 mg/g creat (ref 0–29)
Microalbumin, Urine: 17.6 ug/mL

## 2018-12-24 LAB — LIPID PANEL
Chol/HDL Ratio: 3.9 ratio (ref 0.0–4.4)
Cholesterol, Total: 192 mg/dL (ref 100–199)
HDL: 49 mg/dL (ref >39)
LDL Calculated: 126 mg/dL — ABNORMAL HIGH (ref 0–99)
Triglycerides: 87 mg/dL (ref 0–149)
VLDL Cholesterol Cal: 17 mg/dL (ref 5–40)

## 2018-12-24 LAB — HEMOGLOBIN A1C
Est. average glucose Bld gHb Est-mCnc: 203 mg/dL
Hgb A1c MFr Bld: 8.7 % — ABNORMAL HIGH (ref 4.8–5.6)

## 2018-12-30 ENCOUNTER — Ambulatory Visit: Payer: BC Managed Care – PPO | Admitting: Family Medicine

## 2018-12-30 ENCOUNTER — Encounter: Payer: Self-pay | Admitting: Family Medicine

## 2018-12-30 ENCOUNTER — Other Ambulatory Visit: Payer: Self-pay

## 2018-12-30 ENCOUNTER — Telehealth: Payer: Self-pay | Admitting: Family Medicine

## 2018-12-30 VITALS — BP 135/84 | HR 97 | Temp 98.7°F | Ht 63.0 in | Wt 215.2 lb

## 2018-12-30 DIAGNOSIS — E119 Type 2 diabetes mellitus without complications: Secondary | ICD-10-CM | POA: Diagnosis not present

## 2018-12-30 DIAGNOSIS — I10 Essential (primary) hypertension: Secondary | ICD-10-CM | POA: Diagnosis not present

## 2018-12-30 DIAGNOSIS — Z1211 Encounter for screening for malignant neoplasm of colon: Secondary | ICD-10-CM | POA: Diagnosis not present

## 2018-12-30 DIAGNOSIS — E1169 Type 2 diabetes mellitus with other specified complication: Secondary | ICD-10-CM | POA: Insufficient documentation

## 2018-12-30 DIAGNOSIS — K219 Gastro-esophageal reflux disease without esophagitis: Secondary | ICD-10-CM | POA: Diagnosis not present

## 2018-12-30 DIAGNOSIS — E785 Hyperlipidemia, unspecified: Secondary | ICD-10-CM

## 2018-12-30 DIAGNOSIS — M25512 Pain in left shoulder: Secondary | ICD-10-CM

## 2018-12-30 MED ORDER — CYCLOBENZAPRINE HCL 10 MG PO TABS: 10.0000 mg | ORAL_TABLET | Freq: Every day | ORAL | 2 refills | Status: DC

## 2018-12-30 MED ORDER — METFORMIN HCL 850 MG PO TABS: 850.0000 mg | ORAL_TABLET | Freq: Two times a day (BID) | ORAL | 1 refills | Status: DC

## 2018-12-30 MED ORDER — GLYBURIDE 5 MG PO TABS: 5.0000 mg | ORAL_TABLET | Freq: Every day | ORAL | 1 refills | Status: DC

## 2018-12-30 MED ORDER — PANTOPRAZOLE SODIUM 20 MG PO TBEC: 20.0000 mg | DELAYED_RELEASE_TABLET | Freq: Every day | ORAL | 1 refills | Status: DC

## 2018-12-30 MED ORDER — HYDROCODONE-ACETAMINOPHEN 5-325 MG PO TABS: 1.0000 | ORAL_TABLET | Freq: Four times a day (QID) | ORAL | 0 refills | Status: DC | PRN

## 2018-12-30 MED ORDER — MELOXICAM 15 MG PO TABS: 15.0000 mg | ORAL_TABLET | Freq: Every day | ORAL | 2 refills | Status: DC

## 2018-12-30 MED ORDER — CETIRIZINE HCL 10 MG PO TABS: 10.0000 mg | ORAL_TABLET | Freq: Every day | ORAL | 3 refills | Status: DC

## 2018-12-30 MED ORDER — ATORVASTATIN CALCIUM 10 MG PO TABS: 10.0000 mg | ORAL_TABLET | Freq: Every day | ORAL | 3 refills | Status: DC

## 2018-12-30 NOTE — Telephone Encounter (Signed)
Medication:  HYDROcodone-acetaminophen (NORCO/VICODIN) 5-325 MG tablet [    Pharmacist is requesting diagnosis code attached to this RX.

## 2018-12-30 NOTE — Progress Notes (Signed)
8/11/202011:35 AM  Karen [name] February 15, 1961, 58 y.o., female [id number]  Chief Complaint  Patient presents with  . Medication Refill    received recall notice on metformin, only taking glyburide. Need  pantoprazole, zyrtec, and flexeril  . Pain    asking for a stronger med for the pain med to replace the tramodol for the pain in the right leg left arm. Having very sharp pain    HPI:   Patient is a 58 y.o. female with past medical history significant for DM2, HTN, HLP, left shoulder pain who presents today for routine followup  Last OV feb 2020 Saw ortho march - shoulder injection, plan for mri if not better  Her metformin XR 500 mg daily was recalled She last took metformin 2-3 weeks ago Does not check cbgs a home Denies any lows  Has not gone back to ortho, needs to reschedule Tramadol nor gabapentin has not been helping Continues taking meloxicam, tolerating well since adding PPI Flexeril also helps Works in group home  Takes amlodipine daily Denies any side effects  She is requesting refill of zyrtec which needs to take daily due to year round allergies  Lab Results  Component Value Date   HGBA1C 8.7 (H) 12/23/2018   HGBA1C 9.0 (A) 11/14/2017   HGBA1C 8.0 07/23/2017   Lab Results  Component Value Date   MICROALBUR 1.3 11/26/2014   LDLCALC 126 (H) 12/23/2018   CREATININE 0.89 12/23/2018   The 10-year ASCVD risk score Mikey Bussing DC Jr., et al., 2013) is: 15.3%   Values used to calculate the score:     Age: 67 years     Sex: Female     Is Non-Hispanic African American: Yes     Diabetic: Yes     Tobacco smoker: No     Systolic Blood Pressure: 875 mmHg     Is BP treated: Yes     HDL Cholesterol: 49 mg/dL     Total Cholesterol: 192 mg/dL  Depression screen [facility] 2/9 12/30/2018 07/15/2018 01/16/2018  Decreased Interest 0 0 0  Down, Depressed, Hopeless 0 0 0  PHQ - 2 Score 0 0 0  Altered sleeping - - -  Tired, decreased energy - - -  Change in appetite - - -   Feeling bad or failure about yourself  - - -  Trouble concentrating - - -  Moving slowly or fidgety/restless - - -  Suicidal thoughts - - -  PHQ-9 Score - - -  Difficult doing work/chores - - -    Fall Risk  12/30/2018 07/15/2018 01/16/2018 01/14/2018 11/14/2017  Falls in the past year? 0 0 No No No  Number falls in past yr: 0 0 - - -  Injury with Fall? 0 0 - - -     Allergies  Allergen Reactions  . Orange Fruit [Citrus] Hives  . Penicillins Hives    Prior to Admission medications   Medication Sig Start Date End Date Taking? Authorizing Provider  amLODipine (NORVASC) 5 MG tablet Take 1 tablet (5 mg total) by mouth daily. 10/17/18  Yes Rutherford Guys, MD  cetirizine (ZYRTEC) 10 MG tablet Take 1 tablet (10 mg total) by mouth daily. 07/25/18  Yes Delia Chimes A, MD  cyclobenzaprine (FLEXERIL) 10 MG tablet Take 1 tablet (10 mg total) by mouth at bedtime. 08/14/18  Yes Rutherford Guys, MD  glyBURIDE (DIABETA) 5 MG tablet Take 1 tablet (5 mg total) by mouth daily. Take with meal prior to going  to work 08/14/18  Yes Rutherford Guys, MD  meloxicam (MOBIC) 15 MG tablet Take 1 tablet (15 mg total) by mouth daily. 08/14/18  Yes Rutherford Guys, MD  pantoprazole (PROTONIX) 20 MG tablet Take 1 tablet (20 mg total) by mouth daily. 08/14/18  Yes Rutherford Guys, MD  traMADol (ULTRAM) 50 MG tablet Take 1 tablet (50 mg total) by mouth every 6 (six) hours as needed. 10/03/18  Yes Petrarca, Mike Craze, PA-C    Past Medical History:  Diagnosis Date  . Allergy   . Diabetes mellitus without complication ([place])   . Hypertension     Past Surgical History:  Procedure Laterality Date  . ABDOMINAL HYSTERECTOMY     PID; ovaries resected.  Age 31.  Marland Kitchen APPENDECTOMY      Social History   Tobacco Use  . Smoking status: Former Smoker    Packs/day: 0.70    Years: 30.00    Pack years: 21.00    Types: Cigarettes  . Smokeless tobacco: Never Used  Substance Use Topics  . Alcohol use: No    Family History   Problem Relation Age of Onset  . Cancer Mother 39       uterine cancer  . Heart disease Sister 32       AMI/CAD with stenting  . Hypertension Sister   . Breast cancer Maternal Aunt     Review of Systems  Constitutional: Negative for chills and fever.  Respiratory: Negative for cough and shortness of breath.   Cardiovascular: Negative for chest pain, palpitations and leg swelling.  Gastrointestinal: Negative for abdominal pain, nausea and vomiting.  Musculoskeletal: Positive for joint pain.     OBJECTIVE:  Today's Vitals   12/30/18 1111  BP: 135/84  Pulse: 97  Temp: 98.7 F (37.1 C)  TempSrc: Oral  SpO2: 97%  Weight: 215 lb 3.2 oz (97.6 kg)  Height: 5\' 3"  (1.6 m)   Body mass index is 38.12 kg/m.   Physical Exam Vitals signs and nursing note reviewed.  Constitutional:      Appearance: She is well-developed.  HENT:     Head: Normocephalic and atraumatic.     Mouth/Throat:     Pharynx: No oropharyngeal exudate.  Eyes:     General: No scleral icterus.    Conjunctiva/sclera: Conjunctivae normal.     Pupils: Pupils are equal, round, and reactive to light.  Neck:     Musculoskeletal: Neck supple.  Cardiovascular:     Rate and Rhythm: Normal rate and regular rhythm.     Heart sounds: Normal heart sounds. No murmur. No friction rub. No gallop.   Pulmonary:     Effort: Pulmonary effort is normal.     Breath sounds: Normal breath sounds. No wheezing or rales.  Skin:    General: Skin is warm and dry.  Neurological:     Mental Status: She is alert and oriented to person, place, and time.    ASSESSMENT and PLAN  1. Type 2 diabetes mellitus without complication, without long-term current use of insulin ([facility]) Uncontrolled. Changing to metformin IR, increasing dose. Cont glyburide.   2. Essential hypertension Controlled. Continue current regime.   3. Gastroesophageal reflux disease, esophagitis presence not specified Controlled. Continue current regime.   4.  Colon cancer screening Denies fhx colon cancer. - Cologuard  5. Pain in joint of left shoulder Refilled vicodin, pmp reviewed. Takes very prn. Advised schedule appt with ortho.  6. Hyperlipidemia associated with type 2 diabetes mellitus ([place]) Starting  atorvastatin.  Other orders - metFORMIN (GLUCOPHAGE) 850 MG tablet; Take 1 tablet (850 mg total) by mouth 2 (two) times daily with a meal. - atorvastatin (LIPITOR) 10 MG tablet; Take 1 tablet (10 mg total) by mouth daily. - cyclobenzaprine (FLEXERIL) 10 MG tablet; Take 1 tablet (10 mg total) by mouth at bedtime. - meloxicam (MOBIC) 15 MG tablet; Take 1 tablet (15 mg total) by mouth daily. - HYDROcodone-acetaminophen (NORCO/VICODIN) 5-325 MG tablet; Take 1 tablet by mouth every 6 (six) hours as needed for moderate pain. - cetirizine (ZYRTEC) 10 MG tablet; Take 1 tablet (10 mg total) by mouth daily. - pantoprazole (PROTONIX) 20 MG tablet; Take 1 tablet (20 mg total) by mouth daily. - glyBURIDE (DIABETA) 5 MG tablet; Take 1 tablet (5 mg total) by mouth daily. Take with meal prior to going to work  Return in about 3 months (around 04/01/2019).    Rutherford Guys, MD Primary Care at [facility] [place], [place] [address] Ph.  [phone] Fax [phone]

## 2018-12-30 NOTE — Patient Instructions (Signed)
° ° ° °  If you have lab work done today you will be contacted with your lab results within the next 2 weeks.  If you have not heard from us then please contact us. The fastest way to get your results is to register for My Chart. ° ° °IF you received an x-ray today, you will receive an invoice from [facility]. Please contact [facility] at [phone] with questions or concerns regarding your invoice.  ° °IF you received labwork today, you will receive an invoice from [facility]. Please contact [facility] at [phone] with questions or concerns regarding your invoice.  ° °Our billing staff will not be able to assist you with questions regarding bills from these companies. ° °You will be contacted with the lab results as soon as they are available. The fastest way to get your results is to activate your My Chart account. Instructions are located on the last page of this paperwork. If you have not heard from us regarding the results in 2 weeks, please contact this office. °  ° ° ° °

## 2019-01-02 ENCOUNTER — Telehealth: Payer: Self-pay | Admitting: Family Medicine

## 2019-01-02 NOTE — Telephone Encounter (Signed)
Copied from [place] [id number]. Topic: General - Other >> Jan 02, 2019  9:55 AM Karen [name] A wrote: Reason for CRM: pt called stating she would prefer to do the colonoscopy instead of the coliguard. Pt is requesting a call back. Please advise.

## 2019-01-08 ENCOUNTER — Telehealth: Payer: Self-pay

## 2019-01-08 NOTE — Telephone Encounter (Signed)
Error

## 2019-01-09 NOTE — Telephone Encounter (Signed)
Noted. I have called pt back and she stated that she has already done the Coligaurd

## 2019-03-03 ENCOUNTER — Ambulatory Visit: Payer: BC Managed Care – PPO | Admitting: Family Medicine

## 2019-03-03 ENCOUNTER — Other Ambulatory Visit: Payer: Self-pay

## 2019-03-03 ENCOUNTER — Encounter: Payer: Self-pay | Admitting: Family Medicine

## 2019-03-03 VITALS — BP 118/78 | HR 99 | Temp 98.9°F | Ht 63.0 in | Wt 215.0 lb

## 2019-03-03 DIAGNOSIS — E1165 Type 2 diabetes mellitus with hyperglycemia: Secondary | ICD-10-CM

## 2019-03-03 DIAGNOSIS — R319 Hematuria, unspecified: Secondary | ICD-10-CM

## 2019-03-03 DIAGNOSIS — N3001 Acute cystitis with hematuria: Secondary | ICD-10-CM | POA: Diagnosis not present

## 2019-03-03 LAB — POC MICROSCOPIC URINALYSIS (UMFC): Mucus: ABSENT

## 2019-03-03 LAB — POCT URINALYSIS DIP (MANUAL ENTRY)
Bilirubin, UA: NEGATIVE
Glucose, UA: 500 mg/dL — AB
Ketones, POC UA: NEGATIVE mg/dL
Leukocytes, UA: NEGATIVE
Nitrite, UA: NEGATIVE
Protein Ur, POC: NEGATIVE mg/dL
Spec Grav, UA: 1.02 (ref 1.010–1.025)
Urobilinogen, UA: 0.2 E.U./dL
pH, UA: 5.5 (ref 5.0–8.0)

## 2019-03-03 MED ORDER — ATORVASTATIN CALCIUM 10 MG PO TABS: 10.0000 mg | ORAL_TABLET | Freq: Every day | ORAL | 3 refills | Status: DC

## 2019-03-03 MED ORDER — SULFAMETHOXAZOLE-TRIMETHOPRIM 800-160 MG PO TABS: 1.0000 | ORAL_TABLET | Freq: Two times a day (BID) | ORAL | 0 refills | Status: DC

## 2019-03-03 MED ORDER — PHENAZOPYRIDINE HCL 200 MG PO TABS: 200.0000 mg | ORAL_TABLET | Freq: Two times a day (BID) | ORAL | 0 refills | Status: DC

## 2019-03-03 NOTE — Progress Notes (Signed)
10/13/202011:14 AM  Karen [name] Feb 06, 1961, 58 y.o., female [id number]  Chief Complaint  Patient presents with  . Urinary Tract Infection    [facility] in urine and stomach pain, told to have uti. Given 5 day tx. Still having pressure and pain in stomach. Read that lipitor can cause uti, wants to address this. Has not taking the med since Thurs. Culture sent to pharmacy, not bk yet    HPI:   Patient is a 58 y.o. female with past medical history significant for HTN, GERD, DM2, HLP who presents today for hematuria  Started with gross hematuria and then started having suprapubic pressure and dysuria, chills  A friend gave her cipro for 3 days - did not help so she went to UC [facility] at [facility] Treated with nitrofurantoin x 5 days and pyridium Symptoms had resolved Completed abx on Sunday and 24 hours later she started having hematuria and pressure again No fever, chills, flank pain No h/o kidney stones  Lab Results  Component Value Date   HGBA1C 8.7 (H) 12/23/2018   HGBA1C 9.0 (A) 11/14/2017   HGBA1C 8.0 07/23/2017   Lab Results  Component Value Date   MICROALBUR 1.3 11/26/2014   [place] 126 (H) 12/23/2018   CREATININE 0.89 12/23/2018   Depression screen PHQ 2/9 03/03/2019 12/30/2018 07/15/2018  Decreased Interest 0 0 0  Down, Depressed, Hopeless 0 0 0  PHQ - 2 Score 0 0 0  Altered sleeping - - -  Tired, decreased energy - - -  Change in appetite - - -  Feeling bad or failure about yourself  - - -  Trouble concentrating - - -  Moving slowly or fidgety/restless - - -  Suicidal thoughts - - -  PHQ-9 Score - - -  Difficult doing work/chores - - -    Fall Risk  03/03/2019 12/30/2018 07/15/2018 01/16/2018 01/14/2018  Falls in the past year? 0 0 0 No No  Number falls in past yr: 0 0 0 - -  Injury with Fall? 0 0 0 - -     Allergies  Allergen Reactions  . Orange Fruit [Citrus] Hives  . Penicillins Hives    Prior to Admission medications    Medication Sig Start Date End Date Taking? Authorizing Provider  amLODipine (NORVASC) 5 MG tablet Take 1 tablet (5 mg total) by mouth daily. 10/17/18  Yes Rutherford Guys, MD  cetirizine (ZYRTEC) 10 MG tablet Take 1 tablet (10 mg total) by mouth daily. 12/30/18  Yes Rutherford Guys, MD  cyclobenzaprine (FLEXERIL) 10 MG tablet Take 1 tablet (10 mg total) by mouth at bedtime. 12/30/18  Yes Rutherford Guys, MD  glyBURIDE (DIABETA) 5 MG tablet Take 1 tablet (5 mg total) by mouth daily. Take with meal prior to going to work 12/30/18  Yes Pamella Pert, Lilia Argue, MD  HYDROcodone-acetaminophen (NORCO/VICODIN) 5-325 MG tablet Take 1 tablet by mouth every 6 (six) hours as needed for moderate pain. 12/30/18  Yes Rutherford Guys, MD  meloxicam (MOBIC) 15 MG tablet Take 1 tablet (15 mg total) by mouth daily. 12/30/18  Yes Rutherford Guys, MD  metFORMIN (GLUCOPHAGE) 850 MG tablet Take 1 tablet (850 mg total) by mouth 2 (two) times daily with a meal. 12/30/18  Yes Rutherford Guys, MD  nitrofurantoin, macrocrystal-monohydrate, (MACROBID) 100 MG capsule Take 100 mg by mouth 2 (two) times daily. 02/25/19  Yes [provider]  pantoprazole (PROTONIX) 20 MG tablet Take 1 tablet (  20 mg total) by mouth daily. 12/30/18  Yes Rutherford Guys, MD  phenazopyridine (PYRIDIUM) 200 MG tablet Take 200 mg by mouth 2 (two) times daily. 02/25/19  Yes [provider]  atorvastatin (LIPITOR) 10 MG tablet Take 1 tablet (10 mg total) by mouth daily. Patient not taking: Reported on 03/03/2019 12/30/18   Rutherford Guys, MD    Past Medical History:  Diagnosis Date  . Allergy   . Diabetes mellitus without complication ([place])   . Hypertension     Past Surgical History:  Procedure Laterality Date  . ABDOMINAL HYSTERECTOMY     PID; ovaries resected.  Age 82.  Marland Kitchen APPENDECTOMY      Social History   Tobacco Use  . Smoking status: Former Smoker    Packs/day: 0.70    Years: 30.00    Pack years: 21.00    Types: Cigarettes   . Smokeless tobacco: Never Used  Substance Use Topics  . Alcohol use: No    Family History  Problem Relation Age of Onset  . Cancer Mother 24       uterine cancer  . Heart disease Sister 64       AMI/CAD with stenting  . Hypertension Sister   . Breast cancer Maternal Aunt     ROS Per hpi  OBJECTIVE:  Today's Vitals   03/03/19 1104  BP: 118/78  Pulse: 99  Temp: 98.9 F (37.2 C)  SpO2: 99%  Weight: 215 lb (97.5 kg)  Height: 5\' 3"  (1.6 m)   Body mass index is 38.09 kg/m.   Physical Exam Vitals signs and nursing note reviewed.  Constitutional:      Appearance: She is well-developed.  HENT:     Head: Normocephalic and atraumatic.     Mouth/Throat:     Pharynx: No oropharyngeal exudate.  Eyes:     General: No scleral icterus.    Conjunctiva/sclera: Conjunctivae normal.     Pupils: Pupils are equal, round, and reactive to light.  Neck:     Musculoskeletal: Neck supple.  Cardiovascular:     Rate and Rhythm: Normal rate and regular rhythm.     Heart sounds: Normal heart sounds. No murmur. No friction rub. No gallop.   Pulmonary:     Effort: Pulmonary effort is normal.     Breath sounds: Normal breath sounds. No wheezing, rhonchi or rales.  Abdominal:     General: Bowel sounds are normal. There is no distension.     Palpations: Abdomen is soft.     Tenderness: There is abdominal tenderness (suprapubic). There is no right CVA tenderness or left CVA tenderness.  Skin:    General: Skin is warm and dry.  Neurological:     Mental Status: She is alert and oriented to person, place, and time.     Results for orders placed or performed in visit on 03/03/19 (from the past 24 hour(s))  POCT urinalysis dipstick     Status: Abnormal   Collection Time: 03/03/19 10:56 AM  Result Value Ref Range   Color, UA yellow yellow   Clarity, UA clear clear   Glucose, UA =500 (A) negative mg/dL   Bilirubin, UA negative negative   Ketones, POC UA negative negative mg/dL   Spec  Grav, UA 1.020 1.010 - 1.025   Blood, UA moderate (A) negative   pH, UA 5.5 5.0 - 8.0   Protein Ur, POC negative negative mg/dL   Urobilinogen, UA 0.2 0.2 or 1.0 E.U./dL   Nitrite, UA  Negative Negative   Leukocytes, UA Negative Negative  POCT Microscopic Urinalysis ([facility])     Status: Abnormal   Collection Time: 03/03/19 10:57 AM  Result Value Ref Range   WBC,UR,HPF,POC Many (A) None WBC/hpf   RBC,UR,HPF,POC None None RBC/hpf   Bacteria Few (A) None, Too numerous to count   Mucus Absent Absent   Epithelial Cells, UR Per Microscopy None None, Too numerous to count cells/hpf    No results found.   ASSESSMENT and PLAN  1. Acute cystitis with hematuria 2. Hematuria, unspecified type Not resolved with nitrofurantoin. No cx available for review. Patient self terated with cipro x 3 days. Allergic to [facility]. Empiric tx with bactrim.  Discussed supportive measures, new meds r/se/b and RTC precautions. Patient educational handout given. - POCT urinalysis dipstick - POCT Microscopic Urinalysis ([facility]) - Urine Culture  3. Type 2 diabetes mellitus with hyperglycemia, without long-term current use of insulin (HCC) Discussed glucosuria and increased risk for UTI. Discussed LFM, has appt next month. Resume statin  Other orders - atorvastatin (LIPITOR) 10 MG tablet; Take 1 tablet (10 mg total) by mouth daily. - sulfamethoxazole-trimethoprim (BACTRIM DS) 800-160 MG tablet; Take 1 tablet by mouth 2 (two) times daily. - phenazopyridine (PYRIDIUM) 200 MG tablet; Take 1 tablet (200 mg total) by mouth 2 (two) times daily.  Return for as scheduled.    Rutherford Guys, MD Primary Care at [facility] [place], [place] [address] Ph.  [phone] Fax [phone]

## 2019-03-03 NOTE — Patient Instructions (Addendum)
Urinary Tract Infection, Adult  A urinary tract infection (UTI) is an infection of any part of the urinary tract. The urinary tract includes the kidneys, ureters, bladder, and urethra. These organs make, store, and get rid of urine in the body. Your health care provider may use other names to describe the infection. An upper UTI affects the ureters and kidneys (pyelonephritis). A lower UTI affects the bladder (cystitis) and urethra (urethritis). What are the causes? Most urinary tract infections are caused by bacteria in your genital area, around the entrance to your urinary tract (urethra). These bacteria grow and cause inflammation of your urinary tract. What increases the risk? You are more likely to develop this condition if:  You have a urinary catheter that stays in place (indwelling).  You are not able to control when you urinate or have a bowel movement (you have incontinence).  You are female and you: ? Use a spermicide or diaphragm for birth control. ? Have low estrogen levels. ? Are pregnant.  You have certain genes that increase your risk (genetics).  You are sexually active.  You take antibiotic medicines.  You have a condition that causes your flow of urine to slow down, such as: ? An enlarged prostate, if you are female. ? Blockage in your urethra (stricture). ? A kidney stone. ? A nerve condition that affects your bladder control (neurogenic bladder). ? Not getting enough to drink, or not urinating often.  You have certain medical conditions, such as: ? Diabetes. ? A weak disease-fighting system (immunesystem). ? Sickle cell disease. ? Gout. ? Spinal cord injury. What are the signs or symptoms? Symptoms of this condition include:  Needing to urinate right away (urgently).  Frequent urination or passing small amounts of urine frequently.  Pain or burning with urination.  Blood in the urine.  Urine that smells bad or unusual.  Trouble urinating.  Cloudy  urine.  Vaginal discharge, if you are female.  Pain in the abdomen or the lower back. You may also have:  Vomiting or a decreased appetite.  Confusion.  Irritability or tiredness.  A fever.  Diarrhea. The first symptom in older adults may be confusion. In some cases, they may not have any symptoms until the infection has worsened. How is this diagnosed? This condition is diagnosed based on your medical history and a physical exam. You may also have other tests, including:  Urine tests.  Blood tests.  Tests for sexually transmitted infections (STIs). If you have had more than one UTI, a cystoscopy or imaging studies may be done to determine the cause of the infections. How is this treated? Treatment for this condition includes:  Antibiotic medicine.  Over-the-counter medicines to treat discomfort.  Drinking enough water to stay hydrated. If you have frequent infections or have other conditions such as a kidney stone, you may need to see a health care provider who specializes in the urinary tract (urologist). In rare cases, urinary tract infections can cause sepsis. Sepsis is a life-threatening condition that occurs when the body responds to an infection. Sepsis is treated in the hospital with IV antibiotics, fluids, and other medicines. Follow these instructions at home:  Medicines  Take over-the-counter and prescription medicines only as told by your health care provider.  If you were prescribed an antibiotic medicine, take it as told by your health care provider. Do not stop using the antibiotic even if you start to feel better. General instructions  Make sure you: ? Empty your bladder often and   completely. Do not hold urine for long periods of time. ? Empty your bladder after sex. ? Wipe from front to back after a bowel movement if you are female. Use each tissue one time when you wipe.  Drink enough fluid to keep your urine pale yellow.  Keep all follow-up  visits as told by your health care provider. This is important. Contact a health care provider if:  Your symptoms do not get better after 1-2 days.  Your symptoms go away and then return. Get help right away if you have:  Severe pain in your back or your lower abdomen.  A fever.  Nausea or vomiting. Summary  A urinary tract infection (UTI) is an infection of any part of the urinary tract, which includes the kidneys, ureters, bladder, and urethra.  Most urinary tract infections are caused by bacteria in your genital area, around the entrance to your urinary tract (urethra).  Treatment for this condition often includes antibiotic medicines.  If you were prescribed an antibiotic medicine, take it as told by your health care provider. Do not stop using the antibiotic even if you start to feel better.  Keep all follow-up visits as told by your health care provider. This is important. This information is not intended to replace advice given to you by your health care provider. Make sure you discuss any questions you have with your health care provider. Document Released: 02/14/2005 Document Revised: 04/24/2018 Document Reviewed: 11/14/2017 Elsevier Patient Education  [employer].    If you have lab work done today you will be contacted with your lab results within the next 2 weeks.  If you have not heard from [place] then please contact us. The fastest way to get your results is to register for My Chart.   IF you received an x-ray today, you will receive an invoice from [facility]. Please contact [facility] at [phone] with questions or concerns regarding your invoice.   IF you received labwork today, you will receive an invoice from [place]. Please contact [facility] at [phone] with questions or concerns regarding your invoice.   Our billing staff will not be able to assist you with questions regarding bills from these companies.  You will be contacted  with the lab results as soon as they are available. The fastest way to get your results is to activate your My Chart account. Instructions are located on the last page of this paperwork. If you have not heard from [place] regarding the results in 2 weeks, please contact this office.

## 2019-03-04 LAB — URINE CULTURE: Organism ID, Bacteria: NO GROWTH

## 2019-03-30 ENCOUNTER — Ambulatory Visit: Payer: BC Managed Care – PPO | Admitting: Family Medicine

## 2019-04-10 ENCOUNTER — Ambulatory Visit: Payer: BC Managed Care – PPO | Admitting: Family Medicine

## 2019-04-20 ENCOUNTER — Ambulatory Visit: Payer: BC Managed Care – PPO | Admitting: Family Medicine

## 2019-04-28 ENCOUNTER — Ambulatory Visit: Payer: BC Managed Care – PPO | Admitting: Family Medicine

## 2019-05-05 ENCOUNTER — Ambulatory Visit: Payer: BC Managed Care – PPO | Admitting: Family Medicine

## 2019-05-25 ENCOUNTER — Ambulatory Visit: Payer: BC Managed Care – PPO | Admitting: Family Medicine

## 2019-05-29 ENCOUNTER — Ambulatory Visit: Payer: BC Managed Care – PPO | Admitting: Family Medicine

## 2019-09-15 ENCOUNTER — Encounter: Payer: Self-pay | Admitting: Family Medicine

## 2019-09-15 ENCOUNTER — Other Ambulatory Visit: Payer: Self-pay

## 2019-09-15 ENCOUNTER — Ambulatory Visit: Payer: BC Managed Care – PPO | Admitting: Family Medicine

## 2019-09-15 VITALS — BP 127/84 | HR 92 | Temp 98.2°F | Ht 63.0 in | Wt 209.0 lb

## 2019-09-15 DIAGNOSIS — M7061 Trochanteric bursitis, right hip: Secondary | ICD-10-CM | POA: Diagnosis not present

## 2019-09-15 DIAGNOSIS — E785 Hyperlipidemia, unspecified: Secondary | ICD-10-CM

## 2019-09-15 DIAGNOSIS — E1165 Type 2 diabetes mellitus with hyperglycemia: Secondary | ICD-10-CM | POA: Diagnosis not present

## 2019-09-15 DIAGNOSIS — I10 Essential (primary) hypertension: Secondary | ICD-10-CM

## 2019-09-15 DIAGNOSIS — E1169 Type 2 diabetes mellitus with other specified complication: Secondary | ICD-10-CM | POA: Diagnosis not present

## 2019-09-15 MED ORDER — CYCLOBENZAPRINE HCL 10 MG PO TABS: 10.0000 mg | ORAL_TABLET | Freq: Every day | ORAL | 2 refills | Status: DC

## 2019-09-15 MED ORDER — PREDNISONE 10 MG (21) PO TBPK: ORAL_TABLET | ORAL | 0 refills | Status: DC

## 2019-09-15 MED ORDER — CETIRIZINE HCL 10 MG PO TABS: 10.0000 mg | ORAL_TABLET | Freq: Every day | ORAL | 3 refills | Status: DC

## 2019-09-15 NOTE — Progress Notes (Signed)
4/27/202111:31 AM  Morrell Riddle [birth year], 59 y.o., female [id number]  Chief Complaint  Patient presents with  . R hip pain    x 2 weeks  . L foot blisters    1 week ago , [facility] , taking doxy    HPI:   Patient is a 59 y.o. 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 Denies any falls or trauma Has been taking flexeril, meloxicam and APAP with minimal relief  Seen at urgent care April 18th 2021 for left foot concerns, started on doxy, has appt with podiatrist  She has stopped drinking sodas and has only been drinking water, she will be joining the gym  Has completed covid vaccines  Does not check cbgs at home She denies any low cbgs Will be having [facility]   Lab Results  Component Value Date   HGBA1C 8.7 (H) 12/23/2018   HGBA1C 9.0 (A) 11/14/2017   HGBA1C 8.0 07/23/2017   Lab Results  Component Value Date   MICROALBUR 1.3 11/26/2014   [place] 126 (H) 12/23/2018   CREATININE 0.89 12/23/2018    Depression screen PHQ 2/9 09/15/2019 03/03/2019 12/30/2018  Decreased Interest 0 0 0  Down, Depressed, Hopeless 0 0 0  PHQ - 2 Score 0 0 0  Altered sleeping - - -  Tired, decreased energy - - -  Change in appetite - - -  Feeling bad or failure about yourself  - - -  Trouble concentrating - - -  Moving slowly or fidgety/restless - - -  Suicidal thoughts - - -  PHQ-9 Score - - -  Difficult doing work/chores - - -    Fall Risk  09/15/2019 03/03/2019 12/30/2018 07/15/2018 01/16/2018  Falls in the past year? 0 0 0 0 No  Number falls in past yr: 0 0 0 0 -  Injury with Fall? 0 0 0 0 -  Follow up Falls evaluation completed - - - -     Allergies  Allergen Reactions  . Orange Fruit [Citrus] Hives  .  Penicillins Hives    Prior to Admission medications   Medication Sig Start Date End Date Taking? Authorizing Provider  amLODipine (NORVASC) 5 MG tablet Take 1 tablet (5 mg total) by mouth daily. 10/17/18  Yes Rutherford Guys, MD  atorvastatin (LIPITOR) 10 MG tablet Take 1 tablet (10 mg total) by mouth daily. 03/03/19  Yes Rutherford Guys, MD  cetirizine (ZYRTEC) 10 MG tablet Take 1 tablet (10 mg total) by mouth daily. 12/30/18  Yes Rutherford Guys, MD  cyclobenzaprine (FLEXERIL) 10 MG tablet Take 1 tablet (10 mg total) by mouth at bedtime. 12/30/18  Yes Rutherford Guys, MD  doxycycline (VIBRAMYCIN) 100 MG capsule Take by mouth. 09/06/19  Yes [provider]  glyBURIDE (DIABETA) 5 MG tablet Take 1 tablet (5 mg total) by mouth daily. Take with meal prior to going to work 12/30/18  Yes Pamella Pert, Lilia Argue, MD  meloxicam (MOBIC) 15 MG tablet Take 1 tablet (15 mg total) by mouth daily. 12/30/18  Yes Rutherford Guys, MD  metFORMIN (GLUCOPHAGE) 850 MG tablet Take 1 tablet (850 mg total) by mouth 2 (two) times daily with a meal. 12/30/18  Yes Rutherford Guys, MD  pantoprazole (PROTONIX) 20 MG tablet Take 1 tablet (20 mg total) by mouth daily. 12/30/18  Yes Rutherford Guys, MD  HYDROcodone-acetaminophen (NORCO/VICODIN) 5-325 MG tablet Take 1 tablet by mouth every 6 (six) hours as needed for moderate pain. Patient not taking: Reported on 09/15/2019 12/30/18   Rutherford Guys, MD    Past Medical History:  Diagnosis Date  . Allergy   . Diabetes mellitus without complication ([place])   . Hypertension     Past Surgical History:  Procedure Laterality Date  . ABDOMINAL HYSTERECTOMY     PID; ovaries resected.  Age 81.  Marland Kitchen APPENDECTOMY      Social History   Tobacco Use  . Smoking status: Former Smoker    Packs/day: 0.70    Years: 30.00    Pack years: 21.00    Types: Cigarettes  . Smokeless tobacco: Never Used  Substance Use Topics  . Alcohol use: No    Family History  Problem Relation Age  of Onset  . Cancer Mother 20       uterine cancer  . Heart disease Sister 75       AMI/CAD with stenting  . Hypertension Sister   . Breast cancer Maternal Aunt     Review of Systems  Constitutional: Negative for chills and fever.  Respiratory: Negative for cough and shortness of breath.   Cardiovascular: Negative for chest pain, palpitations and leg swelling.  Gastrointestinal: Negative for abdominal pain, nausea and vomiting.  per hpi   OBJECTIVE:  Today's Vitals   09/15/19 1128  BP: 127/84  Pulse: 92  Temp: 98.2 F (36.8 C)  SpO2: 99%  Weight: 209 lb (94.8 kg)  Height: 5' 3"  (1.6 m)   Body mass index is 37.02 kg/m.  Wt Readings from Last 3 Encounters:  09/15/19 209 lb (94.8 kg)  03/03/19 215 lb (97.5 kg)  12/30/18 215 lb 3.2 oz (97.6 kg)    Physical Exam Vitals and nursing note reviewed.  Constitutional:      Appearance: She is well-developed.  HENT:     Head: Normocephalic and atraumatic.  Eyes:     General: No scleral icterus.    Conjunctiva/sclera: Conjunctivae normal.     Pupils: Pupils are equal, round, and reactive to light.  Pulmonary:     Effort: Pulmonary effort is normal.  Musculoskeletal:     Cervical back: Neck supple.     Right hip: Tenderness (GT bursa) present. No bony tenderness. Decreased range of motion (external rotation 2/2 pain). Normal strength.  Skin:    General: Skin is warm and dry.  Neurological:     Mental Status: She is alert and oriented to person, place, and time.      Diabetic Foot Form - Detailed   Diabetic Foot Exam - detailed Diabetic Foot exam was performed with the following findings: Yes 09/15/2019 11:29 AM  Visual Foot Exam completed.: Yes  Pulse Foot Exam completed.: Yes  Sensory Foot Exam Completed.: Yes Semmes-Weinstein Monofilament Test       No results found for this or any previous visit (from the past 24 hour(s)).  No results found.   ASSESSMENT and PLAN  1. Trochanteric bursitis of right  hip Discussed supportive measures, new meds r/se/b and RTC precautions. Patient educational handout given. Discussed not taking meloxicam or other NSAIDs while on prednisone. Work excuse given.  2. Essential hypertension Controlled. Continue current regime.  - CMP14+EGFR  3. Type 2 diabetes mellitus with hyperglycemia,  without long-term current use of insulin ([place]) Checking labs today, medications will be adjusted as needed. Continue with LFM and weight loss.  - Hemoglobin A1c - HM Diabetes Foot Exam  4. Hyperlipidemia associated with type 2 diabetes mellitus ([place]) Checking labs today, medications will be adjusted as needed.  - Lipid panel  Other orders - cetirizine (ZYRTEC) 10 MG tablet; Take 1 tablet (10 mg total) by mouth daily. - cyclobenzaprine (FLEXERIL) 10 MG tablet; Take 1 tablet (10 mg total) by mouth at bedtime. - predniSONE (STERAPRED UNI-PAK 21 TAB) 10 MG (21) TBPK tablet; Take with food per packet instructions  Return in about 3 months (around 12/15/2019).    Rutherford Guys, MD Primary Care at [facility] [place], [place] [address] Ph.  [phone] Fax [phone]

## 2019-09-15 NOTE — Patient Instructions (Addendum)
Stop meloxicam while taking prednisone Ice right hip for 10-15 minutes 2-3 a day Do not sleep on right hip Exercises per handout If not better in about 2-3 weeks, let me know and I will make referral to sports medicine   Hip Bursitis Rehab Ask your health care provider which exercises are safe for you. Do exercises exactly as told by your health care provider and adjust them as directed. It is normal to feel mild stretching, pulling, tightness, or discomfort as you do these exercises. Stop right away if you feel sudden pain or your pain gets worse. Do not begin these exercises until told by your health care provider. Stretching exercise This exercise warms up your muscles and joints and improves the movement and flexibility of your hip. This exercise also helps to relieve pain and stiffness. Iliotibial band stretch An iliotibial band is a strong band of muscle tissue that runs from the outer side of your hip to the outer side of your thigh and knee. 1. Lie on your side with your left / right leg in the top position. 2. Bend your left / right knee and grab your ankle. Stretch out your bottom arm to help you balance. 3. Slowly bring your knee back so your thigh is behind your body. 4. Slowly lower your knee toward the floor until you feel a gentle stretch on the outside of your left / right thigh. If you do not feel a stretch and your knee will not fall farther, place the heel of your other foot on top of your knee and pull your knee down toward the floor with your foot. 5. Hold this position for __________ seconds. 6. Slowly return to the starting position. Repeat __________ times. Complete this exercise __________ times a day. Strengthening exercises These exercises build strength and endurance in your hip and pelvis. Endurance is the ability to use your muscles for a long time, even after they get tired. Bridge This exercise strengthens the muscles that move your thigh backward (hip  extensors). 1. Lie on your back on a firm surface with your knees bent and your feet flat on the floor. 2. Tighten your buttocks muscles and lift your buttocks off the floor until your trunk is level with your thighs. ? Do not arch your back. ? You should feel the muscles working in your buttocks and the back of your thighs. If you do not feel these muscles, slide your feet 1-2 inches (2.5-5 cm) farther away from your buttocks. ? If this exercise is too easy, try doing it with your arms crossed over your chest. 3. Hold this position for __________ seconds. 4. Slowly lower your hips to the starting position. 5. Let your muscles relax completely after each repetition. Repeat __________ times. Complete this exercise __________ times a day. Squats This exercise strengthens the muscles in front of your thigh and knee (quadriceps). 1. Stand in front of a table, with your feet and knees pointing straight ahead. You may rest your hands on the table for balance but not for support. 2. Slowly bend your knees and lower your hips like you are going to sit in a chair. ? Keep your weight over your heels, not over your toes. ? Keep your lower legs upright so they are parallel with the table legs. ? Do not let your hips go lower than your knees. ? Do not bend lower than told by your health care provider. ? If your hip pain increases, do not bend as  low. 3. Hold the squat position for __________ seconds. 4. Slowly push with your legs to return to standing. Do not use your hands to pull yourself to standing. Repeat __________ times. Complete this exercise __________ times a day. Hip hike 1. Stand sideways on a bottom step. Stand on your left / right leg with your other foot unsupported next to the step. You can hold on to the railing or wall for balance if needed. 2. Keep your knees straight and your torso square. Then lift your left / right hip up toward the ceiling. 3. Hold this position for __________  seconds. 4. Slowly let your left / right hip lower toward the floor, past the starting position. Your foot should get closer to the floor. Do not lean or bend your knees. Repeat __________ times. Complete this exercise __________ times a day. Single leg stand 1. Without shoes, stand near a railing or in a doorway. You may hold on to the railing or door frame as needed for balance. 2. Squeeze your left / right buttock muscles, then lift up your other foot. ? Do not let your left / right hip push out to the side. ? It is helpful to stand in front of a mirror for this exercise so you can watch your hip. 3. Hold this position for __________ seconds. Repeat __________ times. Complete this exercise __________ times a day. This information is not intended to replace advice given to you by your health care provider. Make sure you discuss any questions you have with your health care provider. Document Revised: 09/01/2018 Document Reviewed: 09/01/2018 Elsevier Patient Education  [employer].     If you have lab work done today you will be contacted with your lab results within the next 2 weeks.  If you have not heard from [place] then please contact us. The fastest way to get your results is to register for My Chart.   IF you received an x-ray today, you will receive an invoice from [facility]. Please contact [facility] at [phone] with questions or concerns regarding your invoice.   IF you received labwork today, you will receive an invoice from [facility]. Please contact [facility] at [phone] with questions or concerns regarding your invoice.   Our billing staff will not be able to assist you with questions regarding bills from these companies.  You will be contacted with the lab results as soon as they are available. The fastest way to get your results is to activate your My Chart account. Instructions are located on the last page of this paperwork. If you have not heard  from [place] regarding the results in 2 weeks, please contact this office.

## 2019-09-16 LAB — CMP14+EGFR
ALT: 16 IU/L (ref 0–32)
AST: 20 IU/L (ref 0–40)
Albumin/Globulin Ratio: 1.1 — ABNORMAL LOW (ref 1.2–2.2)
Albumin: 4.1 g/dL (ref 3.8–4.9)
Alkaline Phosphatase: 150 IU/L — ABNORMAL HIGH (ref 39–117)
BUN/Creatinine Ratio: 7 — ABNORMAL LOW (ref 9–23)
BUN: 6 mg/dL (ref 6–24)
Bilirubin Total: 0.4 mg/dL (ref 0.0–1.2)
CO2: 23 mmol/L (ref 20–29)
Calcium: 9.8 mg/dL (ref 8.7–10.2)
Chloride: 102 mmol/L (ref 96–106)
Creatinine, Ser: 0.84 mg/dL (ref 0.57–1.00)
GFR calc Af Amer: 89 mL/min/{1.73_m2} (ref >59)
GFR calc non Af Amer: 77 mL/min/{1.73_m2} (ref >59)
Globulin, Total: 3.8 g/dL (ref 1.5–4.5)
Glucose: 235 mg/dL — ABNORMAL HIGH (ref 65–99)
Potassium: 3.8 mmol/L (ref 3.5–5.2)
Sodium: 141 mmol/L (ref 134–144)
Total Protein: 7.9 g/dL (ref 6.0–8.5)

## 2019-09-16 LAB — HEMOGLOBIN A1C
Est. average glucose Bld gHb Est-mCnc: 269 mg/dL
Hgb A1c MFr Bld: 11 % — ABNORMAL HIGH (ref 4.8–5.6)

## 2019-09-16 LAB — LIPID PANEL
Chol/HDL Ratio: 3 ratio (ref 0.0–4.4)
Cholesterol, Total: 148 mg/dL (ref 100–199)
HDL: 49 mg/dL (ref >39)
LDL Chol Calc (NIH): 81 mg/dL (ref 0–99)
Triglycerides: 100 mg/dL (ref 0–149)
VLDL Cholesterol Cal: 18 mg/dL (ref 5–40)

## 2019-09-24 ENCOUNTER — Ambulatory Visit: Payer: BC Managed Care – PPO | Admitting: Podiatry

## 2019-09-26 ENCOUNTER — Other Ambulatory Visit: Payer: Self-pay | Admitting: Family Medicine

## 2019-09-26 NOTE — Telephone Encounter (Signed)
Requested Prescriptions  Pending Prescriptions Disp Refills  . amLODipine (NORVASC) 5 MG tablet [Pharmacy Med Name: amLODIPine Besylate 5 MG Oral Tablet] 90 tablet 1    Sig: Take 1 tablet by mouth once daily     Cardiovascular:  Calcium Channel Blockers Passed - 09/26/2019  4:09 PM      Passed - Last BP in normal range    BP Readings from Last 1 Encounters:  09/15/19 127/84         Passed - Valid encounter within last 6 months    Recent Outpatient Visits          1 week ago Trochanteric bursitis of right hip   [facility], Lilia Argue, MD   6 months ago Acute cystitis with hematuria   [facility], Lilia Argue, MD   9 months ago Type 2 diabetes mellitus without complication, without long-term current use of insulin [facility])   [facility], Lilia Argue, MD   9 months ago Type 2 diabetes mellitus without complication, without long-term current use of insulin [facility])   [facility] at [facility], [place] A, MD   1 year ago Type 2 diabetes mellitus without complication, without long-term current use of insulin [facility])   [facility], Lilia Argue, MD      Future Appointments            In 2 months Rutherford Guys, MD [facility] at [place]

## 2019-10-06 ENCOUNTER — Other Ambulatory Visit: Payer: Self-pay | Admitting: Family Medicine

## 2019-10-06 MED ORDER — GLYBURIDE 5 MG PO TABS: 10.0000 mg | ORAL_TABLET | Freq: Two times a day (BID) | ORAL | 1 refills | Status: DC

## 2019-10-06 MED ORDER — METFORMIN HCL 1000 MG PO TABS
1000.0000 mg | ORAL_TABLET | Freq: Two times a day (BID) | ORAL | 1 refills | Status: DC
Start: 2019-10-06 — End: 2019-12-31

## 2019-10-15 ENCOUNTER — Other Ambulatory Visit: Payer: Self-pay

## 2019-10-15 ENCOUNTER — Ambulatory Visit: Payer: BC Managed Care – PPO | Admitting: Podiatry

## 2019-10-15 ENCOUNTER — Encounter: Payer: Self-pay | Admitting: Podiatry

## 2019-10-15 DIAGNOSIS — S90822A Blister (nonthermal), left foot, initial encounter: Secondary | ICD-10-CM | POA: Diagnosis not present

## 2019-10-15 DIAGNOSIS — L089 Local infection of the skin and subcutaneous tissue, unspecified: Secondary | ICD-10-CM | POA: Diagnosis not present

## 2019-10-16 NOTE — Progress Notes (Signed)
Subjective:   Patient ID: Karen [name], female   DOB: 59 y.o.   MRN: [id number]   HPI Patient presents concerned about some discomfort lateral ankle and medial and states there is been some discoloration on the sides secondary to blister she had previously and she is concerned about the color and also her fifth nail left she states has some discoloration.  Patient does not smoke and likes to be active   Review of Systems  All other systems reviewed and are negative.       Objective:  Physical Exam Vitals and nursing note reviewed.  Constitutional:      Appearance: She is well-developed.  Pulmonary:     Effort: Pulmonary effort is normal.  Musculoskeletal:        General: Normal range of motion.  Skin:    General: Skin is warm.  Neurological:     Mental Status: She is alert.     Neurovascular status intact muscle strength was found to be adequate range of motion within normal limits.  Patient has areas of discoloration on the medial lateral side of the left heel no drainage is noted no edema or other pathology and slight discoloration left fifth nail.  Patient has good digital perfusion.  Patient has diabetes under good control with no indication neurological disease      Assessment:  Probability that this is secondary to skin and possible blistering but it appears to be healed well with no indication of pathology.  With nail disease that is localized with no issues     Plan:  Reviewed conditions and at this point do not recommend treatment.  I recommended monitoring this soaks if needed and while patient is disappointed about discoloration it does not appear to be infection so I do think it will not be a problem long-term

## 2019-11-04 ENCOUNTER — Other Ambulatory Visit: Payer: Self-pay

## 2019-11-04 ENCOUNTER — Encounter: Payer: Self-pay | Admitting: Registered Nurse

## 2019-11-04 ENCOUNTER — Telehealth: Payer: BC Managed Care – PPO | Admitting: Registered Nurse

## 2019-11-04 DIAGNOSIS — G43919 Migraine, unspecified, intractable, without status migrainosus: Secondary | ICD-10-CM

## 2019-11-04 DIAGNOSIS — J3489 Other specified disorders of nose and nasal sinuses: Secondary | ICD-10-CM

## 2019-11-04 MED ORDER — FLUTICASONE PROPIONATE 50 MCG/ACT NA SUSP: 2.0000 | Freq: Every day | NASAL | 6 refills | Status: DC

## 2019-11-04 MED ORDER — SUMATRIPTAN SUCCINATE 50 MG PO TABS: 50.0000 mg | ORAL_TABLET | ORAL | 3 refills | Status: DC | PRN

## 2019-11-04 NOTE — Patient Instructions (Signed)
° ° ° °  If you have lab work done today you will be contacted with your lab results within the next 2 weeks.  If you have not heard from us then please contact us. The fastest way to get your results is to register for My Chart. ° ° °IF you received an x-ray today, you will receive an invoice from [facility]. Please contact [facility] at [phone] with questions or concerns regarding your invoice.  ° °IF you received labwork today, you will receive an invoice from [facility]. Please contact [facility] at [phone] with questions or concerns regarding your invoice.  ° °Our billing staff will not be able to assist you with questions regarding bills from these companies. ° °You will be contacted with the lab results as soon as they are available. The fastest way to get your results is to activate your My Chart account. Instructions are located on the last page of this paperwork. If you have not heard from us regarding the results in 2 weeks, please contact this office. °  ° ° ° °

## 2019-11-17 ENCOUNTER — Other Ambulatory Visit: Payer: Self-pay

## 2019-11-17 ENCOUNTER — Encounter: Payer: Self-pay | Admitting: Registered Nurse

## 2019-11-17 ENCOUNTER — Ambulatory Visit: Payer: BC Managed Care – PPO | Admitting: Registered Nurse

## 2019-11-17 VITALS — BP 122/73 | HR 107 | Temp 98.0°F | Resp 18 | Ht 63.0 in

## 2019-11-17 DIAGNOSIS — R1013 Epigastric pain: Secondary | ICD-10-CM

## 2019-11-17 MED ORDER — ESOMEPRAZOLE MAGNESIUM 40 MG PO CPDR: 40.0000 mg | DELAYED_RELEASE_CAPSULE | Freq: Every day | ORAL | 3 refills | Status: DC

## 2019-11-17 MED ORDER — SUCRALFATE 1 GM/10ML PO SUSP: 1.0000 g | Freq: Three times a day (TID) | ORAL | 0 refills | Status: DC

## 2019-11-17 NOTE — Progress Notes (Signed)
Acute Office Visit  Subjective:    Patient ID: Karen [name], female    DOB: Oct 23, 1960, 59 y.o.   MRN: [id number]  Chief Complaint  Patient presents with  . GI Problem    patient states she has been having stomach pain right in the middle since last thursday and its getting worse. Per patient she hasn't been able to even finish her food because it feels like her stomach is in a knot. She has not been able to sleep due to pain and also some nausea. Has taken many OTC medications    HPI Patient is in today for abdominal pain  Onset 4-5 days ago. Waxes and wanes, but has not improved at all. Pain is stabbing, burning in nature. Trouble eating due to pain, cannot sleep due to pain. Has not vomited, no nausea, no changes in bowel quality, quantity, or frequency.  No recent changes to diet or exercise Has tried a number of OTC and homeopathic approaches without improvement Has been running low on protonix so has been taking more sparingly, but still consistent No acute event leading to this pain identified.  Past Medical History:  Diagnosis Date  . Allergy   . Diabetes mellitus without complication ([place])   . Hypertension     Past Surgical History:  Procedure Laterality Date  . ABDOMINAL HYSTERECTOMY     PID; ovaries resected.  Age 56.  Marland Kitchen APPENDECTOMY      Family History  Problem Relation Age of Onset  . Cancer Mother 77       uterine cancer  . Heart disease Sister 21       AMI/CAD with stenting  . Hypertension Sister   . Breast cancer Maternal Aunt     Social History   Socioeconomic History  . Marital status: Divorced    Spouse name: Not on file  . Number of children: Not on file  . Years of education: Not on file  . Highest education level: Not on file  Occupational History  . Not on file  Tobacco Use  . Smoking status: Former Smoker    Packs/day: 0.70    Years: 30.00    Pack years: 21.00    Types: Cigarettes  . Smokeless tobacco: Never Used  Vaping Use   . Vaping Use: Never used  Substance and Sexual Activity  . Alcohol use: No  . Drug use: No  . Sexual activity: Yes    Birth control/protection: None  Other Topics Concern  . Not on file  Social History Narrative   Marital status: divorced; not dating      Children:  None      Lives: alone      Employment: Radio broadcast assistant       Tobacco: sometimes; smokes 1 pack per week       Alcohol:  None      Drugs: none      Exercise:  sporadic   Social Determinants of Health   Financial Resource Strain:   . Difficulty of Paying Living Expenses:   Food Insecurity:   . Worried About Charity fundraiser in the Last Year:   . Arboriculturist in the Last Year:   Transportation Needs:   . Film/video editor (Medical):   Marland Kitchen Lack of Transportation (Non-Medical):   Physical Activity:   . Days of Exercise per Week:   . Minutes of Exercise per Session:   Stress:   . Feeling of Stress :  Social Connections:   . Frequency of Communication with Friends and Family:   . Frequency of Social Gatherings with Friends and Family:   . Attends Religious Services:   . Active Member of Clubs or Organizations:   . Attends Archivist Meetings:   Marland Kitchen Marital Status:   Intimate Partner Violence:   . Fear of Current or Ex-Partner:   . Emotionally Abused:   Marland Kitchen Physically Abused:   . Sexually Abused:     Outpatient Medications Prior to Visit  Medication Sig Dispense Refill  . amLODipine (NORVASC) 5 MG tablet Take 1 tablet by mouth once daily 90 tablet 1  . atorvastatin (LIPITOR) 10 MG tablet Take 1 tablet (10 mg total) by mouth daily. 90 tablet 3  . cetirizine (ZYRTEC) 10 MG tablet Take 1 tablet (10 mg total) by mouth daily. 90 tablet 3  . cyclobenzaprine (FLEXERIL) 10 MG tablet Take 1 tablet (10 mg total) by mouth at bedtime. 30 tablet 2  . fluticasone (FLONASE) 50 MCG/ACT nasal spray Place 2 sprays into both nostrils daily. 16 g 6  . glyBURIDE (DIABETA) 5 MG tablet Take 2 tablets (10 mg  total) by mouth 2 (two) times daily with a meal. Take with meal prior to going to work 180 tablet 1  . meloxicam (MOBIC) 15 MG tablet Take 1 tablet (15 mg total) by mouth daily. 30 tablet 2  . metFORMIN (GLUCOPHAGE) 1000 MG tablet Take 1 tablet (1,000 mg total) by mouth 2 (two) times daily with a meal. 180 tablet 1  . SUMAtriptan (IMITREX) 50 MG tablet Take 1 tablet (50 mg total) by mouth every 2 (two) hours as needed for migraine (Max 2 doses daily). May repeat in 2 hours if headache persists or recurs. 10 tablet 3  . pantoprazole (PROTONIX) 20 MG tablet Take 1 tablet (20 mg total) by mouth daily. 90 tablet 1   No facility-administered medications prior to visit.    Allergies  Allergen Reactions  . Orange Fruit [Citrus] Hives  . Penicillins Hives    Review of Systems  Constitutional: Negative.   HENT: Negative.   Eyes: Negative.   Respiratory: Negative.   Cardiovascular: Negative.   Gastrointestinal: Positive for abdominal pain. Negative for abdominal distention, anal bleeding, blood in stool, constipation, diarrhea, nausea, rectal pain and vomiting.  Endocrine: Negative.   Genitourinary: Negative.   Musculoskeletal: Negative.   Skin: Negative.   Allergic/Immunologic: Negative.   Neurological: Negative.   Hematological: Negative.   Psychiatric/Behavioral: Negative.   All other systems reviewed and are negative.      Objective:    Physical Exam Constitutional:      General: She is not in acute distress.    Appearance: Normal appearance. She is obese. She is not ill-appearing, toxic-appearing or diaphoretic.  Cardiovascular:     Rate and Rhythm: Normal rate and regular rhythm.  Pulmonary:     Effort: Pulmonary effort is normal. No respiratory distress.  Abdominal:     General: Abdomen is flat. Bowel sounds are normal. There is no distension.     Palpations: There is no mass.     Tenderness: There is abdominal tenderness. There is guarding. There is no right CVA  tenderness, left CVA tenderness or rebound.     Hernia: No hernia is present.  Neurological:     General: No focal deficit present.     Mental Status: She is alert and oriented to person, place, and time. Mental status is at baseline.  Psychiatric:  Mood and Affect: Mood normal.        Behavior: Behavior normal.        Thought Content: Thought content normal.        Judgment: Judgment normal.     BP 122/73   Pulse (!) 107   Temp 98 F (36.7 C) (Temporal)   Resp 18   Ht 5\' 3"  (1.6 m)   SpO2 99%   BMI 37.02 kg/m  Wt Readings from Last 3 Encounters:  09/15/19 209 lb (94.8 kg)  03/03/19 215 lb (97.5 kg)  12/30/18 215 lb 3.2 oz (97.6 kg)    Health Maintenance Due  Topic Date Due  . URINE MICROALBUMIN  12/23/2019    There are no preventive care reminders to display for this patient.   Lab Results  Component Value Date   TSH 1.150 04/19/2017   Lab Results  Component Value Date   WBC 10.4 04/19/2017   HGB 12.3 04/19/2017   HCT 37.6 04/19/2017   MCV 74 (L) 04/19/2017   PLT 297 04/19/2017   Lab Results  Component Value Date   NA 141 09/15/2019   K 3.8 09/15/2019   CO2 23 09/15/2019   GLUCOSE 235 (H) 09/15/2019   BUN 6 09/15/2019   CREATININE 0.84 09/15/2019   BILITOT 0.4 09/15/2019   ALKPHOS 150 (H) 09/15/2019   AST 20 09/15/2019   ALT 16 09/15/2019   PROT 7.9 09/15/2019   ALBUMIN 4.1 09/15/2019   CALCIUM 9.8 09/15/2019   ANIONGAP 9 06/25/2014   Lab Results  Component Value Date   CHOL 148 09/15/2019   Lab Results  Component Value Date   HDL 49 09/15/2019   Lab Results  Component Value Date   LDLCALC 81 09/15/2019   Lab Results  Component Value Date   TRIG 100 09/15/2019   Lab Results  Component Value Date   CHOLHDL 3.0 09/15/2019   Lab Results  Component Value Date   HGBA1C 11.0 (H) 09/15/2019       Assessment & Plan:   Problem List Items Addressed This Visit    None    Visit Diagnoses    Epigastric pain    -  Primary    Relevant Medications   sucralfate (CARAFATE) 1 GM/10ML suspension   esomeprazole (NEXIUM) 40 MG capsule   Other Relevant Orders   CBC   Hepatic Function Panel   Lipase   Amylase       Meds ordered this encounter  Medications  . sucralfate (CARAFATE) 1 GM/10ML suspension    Sig: Take 10 mLs (1 g total) by mouth 4 (four) times daily -  with meals and at bedtime.    Dispense:  420 mL    Refill:  0    Order Specific Question:   Supervising Provider    AnswerRutherford Guys [[id number]]  . esomeprazole (NEXIUM) 40 MG capsule    Sig: Take 1 capsule (40 mg total) by mouth daily.    Dispense:  30 capsule    Refill:  3    Order Specific Question:   Supervising Provider    AnswerRutherford Guys [[id number]]   PLAN  Gastritis or GERD - will give sucralfate. Go to [employer]. Plenty of water. NPO for at least 2-3 hours before bed.   Labs collected, will follow up as warranted  Pt expressed interest in trulicity as she is having trouble tolerating metformin. Provided information, she will follow up with PCP Dr. Pamella Pert  at routine follow up  Patient encouraged to call clinic with any questions, comments, or concerns.  Maximiano Coss, NP

## 2019-11-17 NOTE — Patient Instructions (Signed)
° ° ° °  If you have lab work done today you will be contacted with your lab results within the next 2 weeks.  If you have not heard from us then please contact us. The fastest way to get your results is to register for My Chart. ° ° °IF you received an x-ray today, you will receive an invoice from [facility]. Please contact [facility] at [phone] with questions or concerns regarding your invoice.  ° °IF you received labwork today, you will receive an invoice from [facility]. Please contact [facility] at [phone] with questions or concerns regarding your invoice.  ° °Our billing staff will not be able to assist you with questions regarding bills from these companies. ° °You will be contacted with the lab results as soon as they are available. The fastest way to get your results is to activate your My Chart account. Instructions are located on the last page of this paperwork. If you have not heard from us regarding the results in 2 weeks, please contact this office. °  ° ° ° °

## 2019-11-18 LAB — CBC
Hematocrit: 40 % (ref 34.0–46.6)
Hemoglobin: 12.8 g/dL (ref 11.1–15.9)
MCH: 23.3 pg — ABNORMAL LOW (ref 26.6–33.0)
MCHC: 32 g/dL (ref 31.5–35.7)
MCV: 73 fL — ABNORMAL LOW (ref 79–97)
Platelets: 350 10*3/uL (ref 150–450)
RBC: 5.5 x10E6/uL — ABNORMAL HIGH (ref 3.77–5.28)
RDW: 17.1 % — ABNORMAL HIGH (ref 11.7–15.4)
WBC: 9.9 10*3/uL (ref 3.4–10.8)

## 2019-11-18 LAB — HEPATIC FUNCTION PANEL
ALT: 14 IU/L (ref 0–32)
AST: 14 IU/L (ref 0–40)
Albumin: 4.2 g/dL (ref 3.8–4.9)
Alkaline Phosphatase: 155 IU/L — ABNORMAL HIGH (ref 48–121)
Bilirubin Total: 0.4 mg/dL (ref 0.0–1.2)
Bilirubin, Direct: 0.14 mg/dL (ref 0.00–0.40)
Total Protein: 8.1 g/dL (ref 6.0–8.5)

## 2019-11-18 LAB — AMYLASE: Amylase: 77 U/L (ref 31–110)

## 2019-11-18 LAB — LIPASE: Lipase: 69 U/L (ref 14–72)

## 2019-11-24 NOTE — Progress Notes (Signed)
If we could call Pt -  Labs holding relatively steady from last set of labs.  She has follow up with Dr. Pamella Pert, who may opt to repeat.  No obvious cause for concerns  Kathrin Ruddy, NP

## 2019-12-15 ENCOUNTER — Ambulatory Visit: Payer: BC Managed Care – PPO | Admitting: Family Medicine

## 2019-12-18 ENCOUNTER — Other Ambulatory Visit: Payer: Self-pay

## 2019-12-18 ENCOUNTER — Telehealth: Payer: Self-pay | Admitting: Family Medicine

## 2019-12-18 NOTE — Telephone Encounter (Signed)
Patient is requesting a refill of the following medications: Requested Prescriptions   Pending Prescriptions Disp Refills  . sulfamethoxazole-trimethoprim (BACTRIM DS) 800-160 MG tablet 14 tablet 0    Sig: Take 1 tablet by mouth 2 (two) times daily.    Date of patient request: 12/18/2019 Last office visit: 11/17/2019 Date of last refill: 03/03/2019 Last refill amount: 14 tablets  Follow up time period per chart: 12/21/2019  Patient states she had blood in her urine and was prescribe this medication and is still having some issues

## 2019-12-18 NOTE — Telephone Encounter (Signed)
Called patient and LVM to return call about which medication she is needing at the time.

## 2019-12-18 NOTE — Telephone Encounter (Signed)
Pt made appt with Orland Mustard on 8/2 for blood in urine but wanted to place med refill for a previous prescription that pcp has ordered in the past for UTI. Advised pt that she may not get refill without eval. Please advise pt

## 2019-12-21 ENCOUNTER — Ambulatory Visit: Payer: BC Managed Care – PPO | Admitting: Registered Nurse

## 2019-12-22 ENCOUNTER — Encounter: Payer: Self-pay | Admitting: Registered Nurse

## 2019-12-22 ENCOUNTER — Ambulatory Visit: Payer: BC Managed Care – PPO | Admitting: Registered Nurse

## 2019-12-22 ENCOUNTER — Other Ambulatory Visit: Payer: Self-pay

## 2019-12-22 VITALS — BP 117/75 | HR 104 | Temp 97.6°F | Resp 18 | Ht 63.0 in | Wt 200.2 lb

## 2019-12-22 DIAGNOSIS — R319 Hematuria, unspecified: Secondary | ICD-10-CM | POA: Diagnosis not present

## 2019-12-22 LAB — POCT URINALYSIS DIP (MANUAL ENTRY)
Bilirubin, UA: NEGATIVE
Glucose, UA: 500 mg/dL — AB
Ketones, POC UA: NEGATIVE mg/dL
Nitrite, UA: NEGATIVE
Protein Ur, POC: NEGATIVE mg/dL
Spec Grav, UA: 1.015 (ref 1.010–1.025)
Urobilinogen, UA: 0.2 E.U./dL
pH, UA: 5 (ref 5.0–8.0)

## 2019-12-22 MED ORDER — SULFAMETHOXAZOLE-TRIMETHOPRIM 800-160 MG PO TABS: 1.0000 | ORAL_TABLET | Freq: Two times a day (BID) | ORAL | 0 refills | Status: DC

## 2019-12-22 NOTE — Patient Instructions (Signed)
° ° ° °  If you have lab work done today you will be contacted with your lab results within the next 2 weeks.  If you have not heard from us then please contact us. The fastest way to get your results is to register for My Chart. ° ° °IF you received an x-ray today, you will receive an invoice from [facility]. Please contact [facility] at [phone] with questions or concerns regarding your invoice.  ° °IF you received labwork today, you will receive an invoice from [facility]. Please contact [facility] at [phone] with questions or concerns regarding your invoice.  ° °Our billing staff will not be able to assist you with questions regarding bills from these companies. ° °You will be contacted with the lab results as soon as they are available. The fastest way to get your results is to activate your My Chart account. Instructions are located on the last page of this paperwork. If you have not heard from us regarding the results in 2 weeks, please contact this office. °  ° ° ° °

## 2019-12-22 NOTE — Progress Notes (Signed)
poctp

## 2019-12-22 NOTE — Progress Notes (Signed)
Acute Office Visit  Subjective:    Patient ID: Karen [name], female    DOB: 16-Jan-1961, 59 y.o.   MRN: [id number]  Chief Complaint  Patient presents with  . Urinary Frequency    Patient states she believes she has an UTI patient states she has been experiencing some pressure while urinating, urinating frequently and also noticed some blood, Per patient she has used some AZO for it and it helped a little     HPI Patient is in today for 5-7 days of dysuria, suprapubic pressure, and hematuria.  No flank pain, nvd, chills, sweats, or systemic symptoms Denies vaginal symptoms No STD exposure  Has had UTI in past - feels very familiar.  Past Medical History:  Diagnosis Date  . Allergy   . Diabetes mellitus without complication ([place])   . Hypertension     Past Surgical History:  Procedure Laterality Date  . ABDOMINAL HYSTERECTOMY     PID; ovaries resected.  Age 33.  Marland Kitchen APPENDECTOMY      Family History  Problem Relation Age of Onset  . Cancer Mother 25       uterine cancer  . Heart disease Sister 110       AMI/CAD with stenting  . Hypertension Sister   . Breast cancer Maternal Aunt     Social History   Socioeconomic History  . Marital status: Divorced    Spouse name: Not on file  . Number of children: Not on file  . Years of education: Not on file  . Highest education level: Not on file  Occupational History  . Not on file  Tobacco Use  . Smoking status: Former Smoker    Packs/day: 0.70    Years: 30.00    Pack years: 21.00    Types: Cigarettes  . Smokeless tobacco: Never Used  Vaping Use  . Vaping Use: Never used  Substance and Sexual Activity  . Alcohol use: No  . Drug use: No  . Sexual activity: Yes    Birth control/protection: None  Other Topics Concern  . Not on file  Social History Narrative   Marital status: divorced; not dating      Children:  None      Lives: alone      Employment: Radio broadcast assistant       Tobacco: sometimes; smokes 1 pack  per week       Alcohol:  None      Drugs: none      Exercise:  sporadic   Social Determinants of Health   Financial Resource Strain:   . Difficulty of Paying Living Expenses:   Food Insecurity:   . Worried About Charity fundraiser in the Last Year:   . Arboriculturist in the Last Year:   Transportation Needs:   . Film/video editor (Medical):   Marland Kitchen Lack of Transportation (Non-Medical):   Physical Activity:   . Days of Exercise per Week:   . Minutes of Exercise per Session:   Stress:   . Feeling of Stress :   Social Connections:   . Frequency of Communication with Friends and Family:   . Frequency of Social Gatherings with Friends and Family:   . Attends Religious Services:   . Active Member of Clubs or Organizations:   . Attends Archivist Meetings:   Marland Kitchen Marital Status:   Intimate Partner Violence:   . Fear of Current or Ex-Partner:   . Emotionally Abused:   .  Physically Abused:   . Sexually Abused:     Outpatient Medications Prior to Visit  Medication Sig Dispense Refill  . amLODipine (NORVASC) 5 MG tablet Take 1 tablet by mouth once daily 90 tablet 1  . atorvastatin (LIPITOR) 10 MG tablet Take 1 tablet (10 mg total) by mouth daily. 90 tablet 3  . cetirizine (ZYRTEC) 10 MG tablet Take 1 tablet (10 mg total) by mouth daily. 90 tablet 3  . cyclobenzaprine (FLEXERIL) 10 MG tablet Take 1 tablet (10 mg total) by mouth at bedtime. 30 tablet 2  . esomeprazole (NEXIUM) 40 MG capsule Take 1 capsule (40 mg total) by mouth daily. 30 capsule 3  . glyBURIDE (DIABETA) 5 MG tablet Take 2 tablets (10 mg total) by mouth 2 (two) times daily with a meal. Take with meal prior to going to work 180 tablet 1  . meloxicam (MOBIC) 15 MG tablet Take 1 tablet (15 mg total) by mouth daily. 30 tablet 2  . metFORMIN (GLUCOPHAGE) 1000 MG tablet Take 1 tablet (1,000 mg total) by mouth 2 (two) times daily with a meal. 180 tablet 1  . sucralfate (CARAFATE) 1 GM/10ML suspension Take 10 mLs (1 g  total) by mouth 4 (four) times daily -  with meals and at bedtime. 420 mL 0  . SUMAtriptan (IMITREX) 50 MG tablet Take 1 tablet (50 mg total) by mouth every 2 (two) hours as needed for migraine (Max 2 doses daily). May repeat in 2 hours if headache persists or recurs. 10 tablet 3  . fluticasone (FLONASE) 50 MCG/ACT nasal spray Place 2 sprays into both nostrils daily. (Patient not taking: Reported on 12/22/2019) 16 g 6   No facility-administered medications prior to visit.    Allergies  Allergen Reactions  . Orange Fruit [Citrus] Hives  . Penicillins Hives    Review of Systems  Constitutional: Negative.   HENT: Negative.   Eyes: Negative.   Respiratory: Negative.   Cardiovascular: Negative.   Gastrointestinal: Negative.   Endocrine: Negative.   Genitourinary: Positive for dysuria, frequency, pelvic pain and urgency. Negative for decreased urine volume, difficulty urinating, dyspareunia, enuresis, flank pain, genital sores, hematuria, menstrual problem, vaginal bleeding, vaginal discharge and vaginal pain.  Musculoskeletal: Negative.   Skin: Negative.   Allergic/Immunologic: Negative.   Neurological: Negative.   Hematological: Negative.   Psychiatric/Behavioral: Negative.   All other systems reviewed and are negative.      Objective:    Physical Exam Vitals and nursing note reviewed.  Constitutional:      General: She is not in acute distress.    Appearance: Normal appearance. She is normal weight. She is not ill-appearing, toxic-appearing or diaphoretic.  Cardiovascular:     Rate and Rhythm: Normal rate and regular rhythm.  Pulmonary:     Effort: Pulmonary effort is normal. No respiratory distress.  Skin:    General: Skin is warm and dry.  Neurological:     General: No focal deficit present.     Mental Status: She is alert and oriented to person, place, and time. Mental status is at baseline.  Psychiatric:        Mood and Affect: Mood normal.        Behavior: Behavior  normal.        Thought Content: Thought content normal.        Judgment: Judgment normal.     BP 117/75   Pulse (!) 104   Temp 97.6 F (36.4 C) (Temporal)   Resp 18  Ht 5\' 3"  (1.6 m)   Wt 200 lb 3.2 oz (90.8 kg)   SpO2 98%   BMI 35.46 kg/m  Wt Readings from Last 3 Encounters:  12/22/19 200 lb 3.2 oz (90.8 kg)  09/15/19 209 lb (94.8 kg)  03/03/19 215 lb (97.5 kg)    Health Maintenance Due  Topic Date Due  . COLONOSCOPY  Never done  . OPHTHALMOLOGY EXAM  11/24/2019  . INFLUENZA VACCINE  12/20/2019  . URINE MICROALBUMIN  12/23/2019    There are no preventive care reminders to display for this patient.   Lab Results  Component Value Date   TSH 1.150 04/19/2017   Lab Results  Component Value Date   WBC 9.9 11/17/2019   HGB 12.8 11/17/2019   HCT 40.0 11/17/2019   MCV 73 (L) 11/17/2019   PLT 350 11/17/2019   Lab Results  Component Value Date   NA 141 09/15/2019   K 3.8 09/15/2019   CO2 23 09/15/2019   GLUCOSE 235 (H) 09/15/2019   BUN 6 09/15/2019   CREATININE 0.84 09/15/2019   BILITOT 0.4 11/17/2019   ALKPHOS 155 (H) 11/17/2019   AST 14 11/17/2019   ALT 14 11/17/2019   PROT 8.1 11/17/2019   ALBUMIN 4.2 11/17/2019   CALCIUM 9.8 09/15/2019   ANIONGAP 9 06/25/2014   Lab Results  Component Value Date   CHOL 148 09/15/2019   Lab Results  Component Value Date   HDL 49 09/15/2019   Lab Results  Component Value Date   LDLCALC 81 09/15/2019   Lab Results  Component Value Date   TRIG 100 09/15/2019   Lab Results  Component Value Date   CHOLHDL 3.0 09/15/2019   Lab Results  Component Value Date   HGBA1C 11.0 (H) 09/15/2019       Assessment & Plan:   Problem List Items Addressed This Visit    None    Visit Diagnoses    Hematuria, unspecified type    -  Primary   Relevant Medications   sulfamethoxazole-trimethoprim (BACTRIM DS) 800-160 MG tablet   Other Relevant Orders   POCT urinalysis dipstick (Completed)       Meds ordered this  encounter  Medications  . sulfamethoxazole-trimethoprim (BACTRIM DS) 800-160 MG tablet    Sig: Take 1 tablet by mouth 2 (two) times daily.    Dispense:  6 tablet    Refill:  0    Order Specific Question:   Supervising Provider    Answer:   Rutherford Guys [[id number]]   PLAN  POCT UA shows leuks and rbcs  Will treat with bactrim po bid  Return precautions reviewed  Patient encouraged to call clinic with any questions, comments, or concerns.  Maximiano Coss, NP

## 2019-12-31 ENCOUNTER — Encounter: Payer: Self-pay | Admitting: Family Medicine

## 2019-12-31 ENCOUNTER — Ambulatory Visit: Payer: BC Managed Care – PPO | Admitting: Family Medicine

## 2019-12-31 ENCOUNTER — Other Ambulatory Visit: Payer: Self-pay

## 2019-12-31 ENCOUNTER — Encounter: Payer: Self-pay | Admitting: Gastroenterology

## 2019-12-31 VITALS — BP 129/79 | HR 88 | Temp 98.3°F | Resp 16 | Ht 63.0 in | Wt 199.4 lb

## 2019-12-31 DIAGNOSIS — E1169 Type 2 diabetes mellitus with other specified complication: Secondary | ICD-10-CM | POA: Diagnosis not present

## 2019-12-31 DIAGNOSIS — Z1231 Encounter for screening mammogram for malignant neoplasm of breast: Secondary | ICD-10-CM

## 2019-12-31 DIAGNOSIS — I10 Essential (primary) hypertension: Secondary | ICD-10-CM | POA: Diagnosis not present

## 2019-12-31 DIAGNOSIS — R319 Hematuria, unspecified: Secondary | ICD-10-CM | POA: Diagnosis not present

## 2019-12-31 DIAGNOSIS — Z1211 Encounter for screening for malignant neoplasm of colon: Secondary | ICD-10-CM

## 2019-12-31 DIAGNOSIS — E785 Hyperlipidemia, unspecified: Secondary | ICD-10-CM

## 2019-12-31 DIAGNOSIS — E1165 Type 2 diabetes mellitus with hyperglycemia: Secondary | ICD-10-CM

## 2019-12-31 LAB — CMP14+EGFR
ALT: 16 IU/L (ref 0–32)
AST: 15 IU/L (ref 0–40)
Albumin/Globulin Ratio: 1 — ABNORMAL LOW (ref 1.2–2.2)
Albumin: 4.1 g/dL (ref 3.8–4.9)
Alkaline Phosphatase: 138 IU/L — ABNORMAL HIGH (ref 48–121)
BUN/Creatinine Ratio: 8 — ABNORMAL LOW (ref 9–23)
BUN: 7 mg/dL (ref 6–24)
Bilirubin Total: 0.2 mg/dL (ref 0.0–1.2)
CO2: 26 mmol/L (ref 20–29)
Calcium: 9.8 mg/dL (ref 8.7–10.2)
Chloride: 98 mmol/L (ref 96–106)
Creatinine, Ser: 0.88 mg/dL (ref 0.57–1.00)
GFR calc Af Amer: 84 mL/min/{1.73_m2} (ref >59)
GFR calc non Af Amer: 73 mL/min/{1.73_m2} (ref >59)
Globulin, Total: 4 g/dL (ref 1.5–4.5)
Glucose: 295 mg/dL — ABNORMAL HIGH (ref 65–99)
Potassium: 4.3 mmol/L (ref 3.5–5.2)
Sodium: 138 mmol/L (ref 134–144)
Total Protein: 8.1 g/dL (ref 6.0–8.5)

## 2019-12-31 LAB — HEMOGLOBIN A1C
Est. average glucose Bld gHb Est-mCnc: 315 mg/dL
Hgb A1c MFr Bld: 12.6 % — ABNORMAL HIGH (ref 4.8–5.6)

## 2019-12-31 LAB — POCT URINALYSIS DIP (MANUAL ENTRY)
Bilirubin, UA: NEGATIVE
Blood, UA: NEGATIVE
Glucose, UA: >=1000 mg/dL — AB
Leukocytes, UA: NEGATIVE
Nitrite, UA: POSITIVE — AB
Spec Grav, UA: 1.025 (ref 1.010–1.025)
Urobilinogen, UA: 0.2 E.U./dL
pH, UA: 5 (ref 5.0–8.0)

## 2019-12-31 LAB — LIPID PANEL
Chol/HDL Ratio: 3.3 ratio (ref 0.0–4.4)
Cholesterol, Total: 160 mg/dL (ref 100–199)
HDL: 49 mg/dL (ref >39)
LDL Chol Calc (NIH): 89 mg/dL (ref 0–99)
Triglycerides: 126 mg/dL (ref 0–149)
VLDL Cholesterol Cal: 22 mg/dL (ref 5–40)

## 2019-12-31 MED ORDER — METFORMIN HCL 500 MG PO TABS
1000.0000 mg | ORAL_TABLET | Freq: Two times a day (BID) | ORAL | 1 refills | Status: DC
Start: 2019-12-31 — End: 2020-04-26

## 2019-12-31 MED ORDER — TRULICITY 0.75 MG/0.5ML ~~LOC~~ SOAJ
0.7500 mg | SUBCUTANEOUS | 0 refills | Status: DC
Start: 2019-12-31 — End: 2020-02-19

## 2019-12-31 MED ORDER — TRULICITY 1.5 MG/0.5ML ~~LOC~~ SOAJ
1.5000 mg | SUBCUTANEOUS | 3 refills | Status: DC
Start: 2020-01-28 — End: 2020-04-25

## 2019-12-31 NOTE — Patient Instructions (Signed)
° ° ° °  If you have lab work done today you will be contacted with your lab results within the next 2 weeks.  If you have not heard from us then please contact us. The fastest way to get your results is to register for My Chart. ° ° °IF you received an x-ray today, you will receive an invoice from [facility]. Please contact [facility] at [phone] with questions or concerns regarding your invoice.  ° °IF you received labwork today, you will receive an invoice from [facility]. Please contact [facility] at [phone] with questions or concerns regarding your invoice.  ° °Our billing staff will not be able to assist you with questions regarding bills from these companies. ° °You will be contacted with the lab results as soon as they are available. The fastest way to get your results is to activate your My Chart account. Instructions are located on the last page of this paperwork. If you have not heard from us regarding the results in 2 weeks, please contact this office. °  ° ° ° °

## 2019-12-31 NOTE — Progress Notes (Signed)
8/12/20219:14 AM  Karen [name] 05-23-1960, 59 y.o., female [id number]  Chief Complaint  Patient presents with  . Bursitis    pt reports about the same Flexeril had worked at first but now does not seem to help at all  . Hypertension    pt has not been taking home BP, pt gets lightheaded and dizzy on occasions pt reports if she goes to another office her BP is typically normal and would like to discuss weening from amlodapine   . Hyperlipidemia    pt reports she has lost 20 lb since 09/2019 but is not trying to with no changes to diet   . Diabetes    pt would like to discuss tulicity rather than daily medications to gain better control, pt has not been monitoring her BG at home, pt has tried to do some leg exercises as possible for her hip and as some exercise  . Hematuria    saw Kathrin Ruddy 12/22/2019 for hematuria she was prescribed abx but still feels she is itching and having some frequency    HPI:   Patient is a 59 y.o. female with past medical history significant for HTN, GERD, DM2, HLP who presents today for routine followup  Last OV April 2021 - increased metformin and glyburide, tx for bursitis of hip Seen again earlier this month - tx for UTI again  She has not been taking metformin as too large to swallow She has been taking glyburide She would like to try trulicity She reports increased urinary frequency, not very thirsty, no nausea, no SOB She overall just feels tired, no energy She reports hematuria resolved with abx  Lab Results  Component Value Date   HGBA1C 11.0 (H) 09/15/2019   HGBA1C 8.7 (H) 12/23/2018   HGBA1C 9.0 (A) 11/14/2017   Lab Results  Component Value Date   MICROALBUR 1.3 11/26/2014   LDLCALC 81 09/15/2019   CREATININE 0.84 09/15/2019    Depression screen PHQ 2/9 12/31/2019 12/22/2019 11/17/2019  Decreased Interest 0 0 0  Down, Depressed, Hopeless 0 0 0  PHQ - 2 Score 0 0 0  Altered sleeping - - -  Tired, decreased energy - - -  Change  in appetite - - -  Feeling bad or failure about yourself  - - -  Trouble concentrating - - -  Moving slowly or fidgety/restless - - -  Suicidal thoughts - - -  PHQ-9 Score - - -  Difficult doing work/chores - - -    Fall Risk  12/31/2019 12/22/2019 11/17/2019 11/04/2019 09/15/2019  Falls in the past year? 0 0 0 0 0  Number falls in past yr: 0 0 0 0 0  Injury with Fall? 0 0 0 0 0  Risk for fall due to : No Fall Risks - - - -  Follow up _0      Allergies  Allergen Reactions  . Orange Fruit [Citrus] Hives  . Penicillins Hives    Prior to Admission medications   Medication Sig Start Date End Date Taking? Authorizing Provider  amLODipine (NORVASC) 5 MG tablet Take 1 tablet by mouth once daily 09/26/19   Rutherford Guys, MD  atorvastatin (LIPITOR) 10 MG tablet Take 1 tablet (10 mg total) by mouth daily. 03/03/19   Rutherford Guys, MD  cetirizine (ZYRTEC) 10 MG tablet Take 1 tablet (10 mg total) by mouth daily. 09/15/19   Pamella Pert,  Irma M, MD  cyclobenzaprine (FLEXERIL) 10 MG tablet Take 1 tablet (10 mg total) by mouth at bedtime. 09/15/19   Santiago, Irma M, MD  esomeprazole (NEXIUM) 40 MG capsule Take 1 capsule (40 mg total) by mouth daily. 11/17/19   Morrow, Richard, NP  glyBURIDE (DIABETA) 5 MG tablet Take 2 tablets (10 mg total) by mouth 2 (two) times daily with a meal. Take with meal prior to going to work 10/06/19   Santiago, Irma M, MD  metFORMIN (GLUCOPHAGE) 1000 MG tablet Take 1 tablet (1,000 mg total) by mouth 2 (two) times daily with a meal. 10/06/19   Santiago, Irma M, MD  sulfamethoxazole-trimethoprim (BACTRIM DS) 800-160 MG tablet Take 1 tablet by mouth 2 (two) times daily. 12/22/19   Morrow, Richard, NP  SUMAtriptan (IMITREX) 50 MG tablet Take 1 tablet (50 mg total) by mouth every 2 (two) hours as needed for migraine (Max 2 doses daily). May repeat in 2 hours  if headache persists or recurs. 11/04/19   Morrow, Richard, NP    Past Medical History:  Diagnosis Date  . Allergy   . Diabetes mellitus without complication ([facility])   . Hypertension     Past Surgical History:  Procedure Laterality Date  . ABDOMINAL HYSTERECTOMY     PID; ovaries resected.  Age 24.  . APPENDECTOMY      Social History   Tobacco Use  . Smoking status: Former Smoker    Packs/day: 0.70    Years: 30.00    Pack years: 21.00    Types: Cigarettes  . Smokeless tobacco: Never Used  Substance Use Topics  . Alcohol use: No    Family History  Problem Relation Age of Onset  . Cancer Mother 33       uterine cancer  . Heart disease Sister 43       AMI/CAD with stenting  . Hypertension Sister   . Breast cancer Maternal Aunt     Review of Systems  Constitutional: Negative for chills and fever.  Respiratory: Negative for cough and shortness of breath.   Cardiovascular: Negative for chest pain, palpitations and leg swelling.  Gastrointestinal: Negative for abdominal pain, nausea and vomiting.  per hpi   OBJECTIVE:  Today's Vitals   12/31/19 0830  BP: 129/79  Pulse: 88  Resp: 16  Temp: 98.3 F (36.8 C)  TempSrc: Temporal  SpO2: 97%  Weight: 199 lb 6.4 oz (90.4 kg)  Height: 5' 3" (1.6 m)   Body mass index is 35.32 kg/m.   Wt Readings from Last 3 Encounters:  12/31/19 199 lb 6.4 oz (90.4 kg)  12/22/19 200 lb 3.2 oz (90.8 kg)  09/15/19 209 lb (94.8 kg)    Physical Exam Vitals and nursing note reviewed.  Constitutional:      Appearance: She is well-developed.  HENT:     Head: Normocephalic and atraumatic.  Eyes:     General: No scleral icterus.    Conjunctiva/sclera: Conjunctivae normal.     Pupils: Pupils are equal, round, and reactive to light.  Pulmonary:     Effort: Pulmonary effort is normal.  Musculoskeletal:     Cervical back: Neck supple.  Skin:    General: Skin is warm and dry.  Neurological:     Mental Status: She is alert and  oriented to person, place, and time.     Results for orders placed or performed in visit on 12/31/19 (from the past 24 hour(s))  POCT urinalysis dipstick     Status: Abnormal     Collection Time: 12/31/19  8:46 AM  Result Value Ref Range   Color, UA yellow yellow   Clarity, UA clear clear   Glucose, UA >=1,000 (A) negative mg/dL   Bilirubin, UA negative negative   Ketones, POC UA trace (5) (A) negative mg/dL   Spec Grav, UA 1.025 1.010 - 1.025   Blood, UA negative negative   pH, UA 5.0 5.0 - 8.0   Protein Ur, POC trace (A) negative mg/dL   Urobilinogen, UA 0.2 0.2 or 1.0 E.U./dL   Nitrite, UA Positive (A) Negative   Leukocytes, UA Negative Negative    No results found.   ASSESSMENT and PLAN  1. Type 2 diabetes mellitus with hyperglycemia, without long-term current use of insulin ([place]) Per last a1c not at goal. Trial of trulicity, reviewed r/se/b. Restart metformin. Dc glyburide. Discussed LFM/carb counting. Provided patient educational materials.  - CMP14+EGFR - Microalbumin / creatinine urine ratio - Hemoglobin A1c  2. Hyperlipidemia associated with type 2 diabetes mellitus ([place]) - Lipid panel  3. Essential hypertension Controlled. Continue current regime.   4. Hematuria, unspecified type - POCT urinalysis dipstick - resolved  5. Visit for screening mammogram - MM DIGITAL SCREENING BILATERAL; Future  6. Colon cancer screening - Ambulatory referral to Gastroenterology  Other orders - metFORMIN (GLUCOPHAGE) 500 MG tablet; Take 2 tablets (1,000 mg total) by mouth 2 (two) times daily with a meal. - Dulaglutide (TRULICITY) 0.60 OK/5.9XH SOPN; Inject 0.5 mLs (0.75 mg total) into the skin once a week. - Dulaglutide (TRULICITY) 1.5 FS/1.4EL SOPN; Inject 0.5 mLs (1.5 mg total) into the skin once a week.  Return in about 3 months (around 04/01/2020).    Rutherford Guys, MD [facility] [address] [place], [place] [address] Ph.  [phone] Fax  [phone]

## 2020-01-01 LAB — MICROALBUMIN / CREATININE URINE RATIO
Creatinine, Urine: 137.6 mg/dL
Microalb/Creat Ratio: 18 mg/g creat (ref 0–29)
Microalbumin, Urine: 25.4 ug/mL

## 2020-01-14 ENCOUNTER — Ambulatory Visit: Payer: BLUE CROSS/BLUE SHIELD

## 2020-01-19 ENCOUNTER — Telehealth: Payer: Self-pay | Admitting: Family Medicine

## 2020-01-19 NOTE — Telephone Encounter (Signed)
Pt would like a call concerning her most recent lab results. Please advise at [phone].

## 2020-01-20 ENCOUNTER — Ambulatory Visit
Admission: RE | Admit: 2020-01-20 | Discharge: 2020-01-20 | Disposition: A | Discharge status: Home / Self Care | Payer: BC Managed Care – PPO | Source: Ambulatory Visit | Attending: Family Medicine | Admitting: Family Medicine

## 2020-01-20 ENCOUNTER — Other Ambulatory Visit: Payer: Self-pay

## 2020-01-20 DIAGNOSIS — Z1231 Encounter for screening mammogram for malignant neoplasm of breast: Secondary | ICD-10-CM

## 2020-02-09 ENCOUNTER — Encounter: Payer: Self-pay | Admitting: Registered Nurse

## 2020-02-09 NOTE — Progress Notes (Signed)
Acute Office Visit  Subjective:    Patient ID: Karen [name], female    DOB: December 06, 1960, 59 y.o.   MRN: [id number]  Chief Complaint  Patient presents with  . Headache    Patient states she has been having some headaches for about 1 and a half weeks . Per patient she is currently taking the OTC medication for migraines with no relief    HPI Patient is in today for headaches Mild, unilateral, usually L side On and off for a week or more Slow onset, no thunderclap Not worst headache she has had Questions if she has had hx of migraine More stress lately Admits to poor diet and hydration  No other neuro symptoms at this time.   Past Medical History:  Diagnosis Date  . Allergy   . Diabetes mellitus without complication ([place])   . Hypertension     Past Surgical History:  Procedure Laterality Date  . ABDOMINAL HYSTERECTOMY     PID; ovaries resected.  Age 38.  Marland Kitchen APPENDECTOMY      Family History  Problem Relation Age of Onset  . Cancer Mother 6       uterine cancer  . Heart disease Sister 65       AMI/CAD with stenting  . Hypertension Sister   . Breast cancer Maternal Aunt     Social History   Socioeconomic History  . Marital status: Divorced    Spouse name: Not on file  . Number of children: Not on file  . Years of education: Not on file  . Highest education level: Not on file  Occupational History  . Not on file  Tobacco Use  . Smoking status: Former Smoker    Packs/day: 0.70    Years: 30.00    Pack years: 21.00    Types: Cigarettes  . Smokeless tobacco: Never Used  Vaping Use  . Vaping Use: Never used  Substance and Sexual Activity  . Alcohol use: No  . Drug use: No  . Sexual activity: Yes    Birth control/protection: None  Other Topics Concern  . Not on file  Social History Narrative   Marital status: divorced; not dating      Children:  None      Lives: alone      Employment: Radio broadcast assistant       Tobacco: sometimes; smokes 1 pack per  week       Alcohol:  None      Drugs: none      Exercise:  sporadic   Social Determinants of Health   Financial Resource Strain:   . Difficulty of Paying Living Expenses: Not on file  Food Insecurity:   . Worried About Charity fundraiser in the Last Year: Not on file  . Ran Out of Food in the Last Year: Not on file  Transportation Needs:   . Lack of Transportation (Medical): Not on file  . Lack of Transportation (Non-Medical): Not on file  Physical Activity:   . Days of Exercise per Week: Not on file  . Minutes of Exercise per Session: Not on file  Stress:   . Feeling of Stress : Not on file  Social Connections:   . Frequency of Communication with Friends and Family: Not on file  . Frequency of Social Gatherings with Friends and Family: Not on file  . Attends Religious Services: Not on file  . Active Member of Clubs or Organizations: Not on file  . Attends  Club or Organization Meetings: Not on file  . Marital Status: Not on file  Intimate Partner Violence:   . Fear of Current or Ex-Partner: Not on file  . Emotionally Abused: Not on file  . Physically Abused: Not on file  . Sexually Abused: Not on file    Outpatient Medications Prior to Visit  Medication Sig Dispense Refill  . amLODipine (NORVASC) 5 MG tablet Take 1 tablet by mouth once daily 90 tablet 1  . atorvastatin (LIPITOR) 10 MG tablet Take 1 tablet (10 mg total) by mouth daily. 90 tablet 3  . cetirizine (ZYRTEC) 10 MG tablet Take 1 tablet (10 mg total) by mouth daily. 90 tablet 3  . cyclobenzaprine (FLEXERIL) 10 MG tablet Take 1 tablet (10 mg total) by mouth at bedtime. 30 tablet 2  . glyBURIDE (DIABETA) 5 MG tablet Take 2 tablets (10 mg total) by mouth 2 (two) times daily with a meal. Take with meal prior to going to work 180 tablet 1  . metFORMIN (GLUCOPHAGE) 1000 MG tablet Take 1 tablet (1,000 mg total) by mouth 2 (two) times daily with a meal. 180 tablet 1  . pantoprazole (PROTONIX) 20 MG tablet Take 1 tablet (20  mg total) by mouth daily. 90 tablet 1  . meloxicam (MOBIC) 15 MG tablet Take 1 tablet (15 mg total) by mouth daily. (Patient not taking: Reported on 12/31/2019) 30 tablet 2   No facility-administered medications prior to visit.    Allergies  Allergen Reactions  . Orange Fruit [Citrus] Hives  . Penicillins Hives    Review of Systems  Constitutional: Negative.   HENT: Negative.   Eyes: Negative.   Respiratory: Negative.   Cardiovascular: Negative.   Gastrointestinal: Negative.   Genitourinary: Negative.   Musculoskeletal: Negative.   Skin: Negative.   Neurological: Negative.   Psychiatric/Behavioral: Negative.        Objective:    Physical Exam Constitutional:      General: She is not in acute distress.    Appearance: She is well-developed. She is not ill-appearing, toxic-appearing or diaphoretic.  Eyes:     General: No visual field deficit. Neurological:     Mental Status: She is alert and oriented to person, place, and time. Mental status is at baseline.     GCS: GCS eye subscore is 4. GCS verbal subscore is 5. GCS motor subscore is 6.     Cranial Nerves: No cranial nerve deficit, dysarthria or facial asymmetry.     Sensory: No sensory deficit.     Motor: No weakness.     Coordination: Romberg sign negative. Coordination normal.     Gait: Gait normal.  Psychiatric:        Mood and Affect: Mood normal.        Speech: Speech normal.        Behavior: Behavior normal.     There were no vitals taken for this visit. Wt Readings from Last 3 Encounters:  12/31/19 199 lb 6.4 oz (90.4 kg)  12/22/19 200 lb 3.2 oz (90.8 kg)  09/15/19 209 lb (94.8 kg)    Health Maintenance Due  Topic Date Due  . OPHTHALMOLOGY EXAM  11/24/2019    There are no preventive care reminders to display for this patient.   Lab Results  Component Value Date   TSH 1.150 04/19/2017   Lab Results  Component Value Date   WBC 9.9 11/17/2019   HGB 12.8 11/17/2019   HCT 40.0 11/17/2019    MCV 73 (L)  11/17/2019   PLT 350 11/17/2019   Lab Results  Component Value Date   NA 138 12/31/2019   K 4.3 12/31/2019   CO2 26 12/31/2019   GLUCOSE 295 (H) 12/31/2019   BUN 7 12/31/2019   CREATININE 0.88 12/31/2019   BILITOT 0.2 12/31/2019   ALKPHOS 138 (H) 12/31/2019   AST 15 12/31/2019   ALT 16 12/31/2019   PROT 8.1 12/31/2019   ALBUMIN 4.1 12/31/2019   CALCIUM 9.8 12/31/2019   ANIONGAP 9 06/25/2014   Lab Results  Component Value Date   CHOL 160 12/31/2019   Lab Results  Component Value Date   HDL 49 12/31/2019   Lab Results  Component Value Date   LDLCALC 89 12/31/2019   Lab Results  Component Value Date   TRIG 126 12/31/2019   Lab Results  Component Value Date   CHOLHDL 3.3 12/31/2019   Lab Results  Component Value Date   HGBA1C 12.6 (H) 12/31/2019       Assessment & Plan:   Problem List Items Addressed This Visit    None    Visit Diagnoses    Sinus pressure    -  Primary   Intractable migraine without status migrainosus, unspecified migraine type       Relevant Medications   SUMAtriptan (IMITREX) 50 MG tablet       Meds ordered this encounter  Medications  . SUMAtriptan (IMITREX) 50 MG tablet    Sig: Take 1 tablet (50 mg total) by mouth every 2 (two) hours as needed for migraine (Max 2 doses daily). May repeat in 2 hours if headache persists or recurs.    Dispense:  10 tablet    Refill:  3    Order Specific Question:   Supervising Provider    Answer:   Carlota Raspberry, JEFFREY R [[id number]]  . DISCONTD: fluticasone (FLONASE) 50 MCG/ACT nasal spray    Sig: Place 2 sprays into both nostrils daily.    Dispense:  16 g    Refill:  6    Order Specific Question:   Supervising Provider    Answer:   Carlota Raspberry, JEFFREY R [[id number]]   PLAN  Discussed sumatriptan use as abortive therapy for migraines. Ok to use ibuprofen with this  Discussed nonpharm including hydration, caffeine, avoiding bright lights and loud noises  Return prn. Some concern exists for new  headaches after age 33 - though these may not be new, pt imperfect historian.  Patient encouraged to call clinic with any questions, comments, or concerns.   Maximiano Coss, NP

## 2020-02-15 ENCOUNTER — Encounter: Payer: Self-pay | Admitting: Family Medicine

## 2020-02-15 ENCOUNTER — Ambulatory Visit: Payer: BC Managed Care – PPO | Admitting: Family Medicine

## 2020-02-15 ENCOUNTER — Other Ambulatory Visit: Payer: Self-pay

## 2020-02-15 VITALS — BP 129/80 | HR 93 | Temp 98.0°F | Ht 63.0 in | Wt 193.0 lb

## 2020-02-15 DIAGNOSIS — M7061 Trochanteric bursitis, right hip: Secondary | ICD-10-CM

## 2020-02-15 DIAGNOSIS — E1165 Type 2 diabetes mellitus with hyperglycemia: Secondary | ICD-10-CM

## 2020-02-15 MED ORDER — BLOOD GLUCOSE METER KIT: PACK | 11 refills | Status: AC

## 2020-02-15 NOTE — Patient Instructions (Addendum)
  Check blood glucose once a day.  Alternate between fasting (goal is 90-130) and 2 hours after dinner (goal is 160-180)   If you have lab work done today you will be contacted with your lab results within the next 2 weeks.  If you have not heard from [place] then please contact us. The fastest way to get your results is to register for My Chart.   IF you received an x-ray today, you will receive an invoice from [facility]. Please contact [facility] at [phone] with questions or concerns regarding your invoice.   IF you received labwork today, you will receive an invoice from [facility]. Please contact [facility] at [phone] with questions or concerns regarding your invoice.   Our billing staff will not be able to assist you with questions regarding bills from these companies.  You will be contacted with the lab results as soon as they are available. The fastest way to get your results is to activate your My Chart account. Instructions are located on the last page of this paperwork. If you have not heard from [place] regarding the results in 2 weeks, please contact this office.

## 2020-02-15 NOTE — Progress Notes (Signed)
9/27/20214:44 PM  Karen [name] January 02, 1961, 59 y.o., female [id number]  Chief Complaint  Patient presents with  . Diabetes    has completed eye exam and mammo, has been taking trulicity and will increase to 1.5 on 10/10, request new glucometer and supplies    HPI:   Patient is a 59 y.o. female with past medical history significant for HTN, GERD, DM2, HLP who presents today for routine followup  Last OV a month ago - restarted metformin, started trulicity, dc glypizide She is tolerating truliicty well She is not tolerating metformin 1054m BID Her right hip continues to cause pain, interfering with her sleep, traveling down into her leg Flexeril is not helping much  She would like to see ortho - Dr WGarnette Czechwhom she saw for her shoulder   Wt Readings from Last 3 Encounters:  02/15/20 193 lb (87.5 kg)  12/31/19 199 lb 6.4 oz (90.4 kg)  12/22/19 200 lb 3.2 oz (90.8 kg)   BP Readings from Last 3 Encounters:  02/15/20 129/80  12/31/19 129/79  12/22/19 117/75   Lab Results  Component Value Date   HGBA1C 12.6 (H) 12/31/2019   HGBA1C 11.0 (H) 09/15/2019   HGBA1C 8.7 (H) 12/23/2018   Lab Results  Component Value Date   MICROALBUR 1.3 11/26/2014   LDLCALC 89 12/31/2019   CREATININE 0.88 12/31/2019    Depression screen PHQ 2/9 02/15/2020 12/31/2019 12/22/2019  Decreased Interest 0 0 0  Down, Depressed, Hopeless 0 0 0  PHQ - 2 Score 0 0 0  Altered sleeping - - -  Tired, decreased energy - - -  Change in appetite - - -  Feeling bad or failure about yourself  - - -  Trouble concentrating - - -  Moving slowly or fidgety/restless - - -  Suicidal thoughts - - -  PHQ-9 Score - - -  Difficult doing work/chores - - -    Fall Risk  02/15/2020 12/31/2019 12/22/2019 11/17/2019 11/04/2019  Falls in the past year? 0 0 0 0 0  Number falls in past yr: 0 0 0 0 0  Injury with Fall? 0 0 0 0 0  Risk for fall due to : - No Fall Risks - - -  Follow up Falls evaluation completed Falls  evaluation completed Falls evaluation completed Falls evaluation completed Falls evaluation completed     Allergies  Allergen Reactions  . Orange Fruit [Citrus] Hives  . Penicillins Hives    Prior to Admission medications   Medication Sig Start Date End Date Taking? Authorizing Provider  amLODipine (NORVASC) 5 MG tablet Take 1 tablet by mouth once daily 09/26/19  Yes SRutherford Guys MD  atorvastatin (LIPITOR) 10 MG tablet Take 1 tablet (10 mg total) by mouth daily. 03/03/19  Yes SRutherford Guys MD  cetirizine (ZYRTEC) 10 MG tablet Take 1 tablet (10 mg total) by mouth daily. 09/15/19  Yes SRutherford Guys MD  cyclobenzaprine (FLEXERIL) 10 MG tablet Take 1 tablet (10 mg total) by mouth at bedtime. 09/15/19  Yes SRutherford Guys MD  Dulaglutide (TRULICITY) 07.42MVZ/5.6LOSOPN Inject 0.5 mLs (0.75 mg total) into the skin once a week. 12/31/19  Yes SRutherford Guys MD  Dulaglutide (TRULICITY) 1.5 MVF/6.4PPSOPN Inject 0.5 mLs (1.5 mg total) into the skin once a week. 01/28/20  Yes SRutherford Guys MD  esomeprazole (NEXIUM) 40 MG capsule Take 1 capsule (40 mg total) by mouth daily. 11/17/19  Yes MMaximiano Coss NP  metFORMIN (GLUCOPHAGE)  500 MG tablet Take 2 tablets (1,000 mg total) by mouth 2 (two) times daily with a meal. 12/31/19 03/30/20 Yes Rutherford Guys, MD  SUMAtriptan (IMITREX) 50 MG tablet Take 1 tablet (50 mg total) by mouth every 2 (two) hours as needed for migraine (Max 2 doses daily). May repeat in 2 hours if headache persists or recurs. 11/04/19  Yes Maximiano Coss, NP    Past Medical History:  Diagnosis Date  . Allergy   . Diabetes mellitus without complication ([place])   . Hypertension     Past Surgical History:  Procedure Laterality Date  . ABDOMINAL HYSTERECTOMY     PID; ovaries resected.  Age 38.  Marland Kitchen APPENDECTOMY      Social History   Tobacco Use  . Smoking status: Former Smoker    Packs/day: 0.70    Years: 30.00    Pack years: 21.00    Types: Cigarettes  .  Smokeless tobacco: Never Used  Substance Use Topics  . Alcohol use: No    Family History  Problem Relation Age of Onset  . Cancer Mother 46       uterine cancer  . Heart disease Sister 87       AMI/CAD with stenting  . Hypertension Sister   . Breast cancer Maternal Aunt     ROS Per hpi  OBJECTIVE:  Today's Vitals   02/15/20 1631  BP: 129/80  Pulse: 93  Temp: 98 F (36.7 C)  SpO2: 98%  Weight: 193 lb (87.5 kg)  Height: 5' 3"  (1.6 m)   Body mass index is 34.19 kg/m.   Physical Exam Vitals and nursing note reviewed.  Constitutional:      Appearance: She is well-developed.  HENT:     Head: Normocephalic and atraumatic.  Eyes:     General: No scleral icterus.    Conjunctiva/sclera: Conjunctivae normal.     Pupils: Pupils are equal, round, and reactive to light.  Pulmonary:     Effort: Pulmonary effort is normal.  Musculoskeletal:     Cervical back: Neck supple.  Skin:    General: Skin is warm and dry.  Neurological:     Mental Status: She is alert and oriented to person, place, and time.     No results found for this or any previous visit (from the past 24 hour(s)).  No results found.   ASSESSMENT and PLAN  1. Type 2 diabetes mellitus with hyperglycemia, without long-term current use of insulin ([facility]) Continue with carb counting, cont with weight loss. Increase truclity to 1.13m weekly as previously discussed. Discussed slowly titrating to 10021mBID. - blood glucose meter kit and supplies; Per insurance coverage. Check blood glucose once a day. Dx E11.65  2. Trochanteric bursitis of right hip - Ambulatory referral to Orthopedic Surgery  Return in about 3 months (around 05/16/2020) for Dr SaMitchel Honour   Ir[facility]utherford GuysMD [facility] [place] [id number]  [phone]

## 2020-02-19 ENCOUNTER — Other Ambulatory Visit: Payer: Self-pay

## 2020-02-19 ENCOUNTER — Encounter: Payer: Self-pay | Admitting: Gastroenterology

## 2020-02-19 ENCOUNTER — Ambulatory Visit (AMBULATORY_SURGERY_CENTER): Payer: Self-pay | Admitting: *Deleted

## 2020-02-19 VITALS — Ht 63.0 in | Wt 190.0 lb

## 2020-02-19 DIAGNOSIS — Z1211 Encounter for screening for malignant neoplasm of colon: Secondary | ICD-10-CM

## 2020-02-19 MED ORDER — SUPREP BOWEL PREP KIT 17.5-3.13-1.6 GM/177ML PO SOLN: 1.0000 | Freq: Once | ORAL | 0 refills | Status: AC

## 2020-02-19 NOTE — Progress Notes (Signed)
cov vax x 2   No egg or soy allergy known to patient  No issues with past sedation with any surgeries or procedures no intubation problems in the past  No FH of Malignant Hyperthermia No diet pills per patient No home 02 use per patient  No blood thinners per patient  Pt denies issues with constipation  No A fib or A flutter  EMMI video to pt or via MyChart  COVID 19 guidelines implemented in [facility] today with Pt and RN   Suprep  Coupon given to pt in [facility] today , Code to Pharmacy   Due to the COVID-19 pandemic we are asking patients to follow these guidelines. Please only bring one care partner. Please be aware that your care partner may wait in the car in the parking lot or if they feel like they will be too hot to wait in the car, they may wait in the lobby on the 4th floor. All care partners are required to wear a mask the entire time (we do not have any that we can provide them), they need to practice social distancing, and we will do a Covid check for all patient's and care partners when you arrive. Also we will check their temperature and your temperature. If the care partner waits in their car they need to stay in the parking lot the entire time and we will call them on their cell phone when the patient is ready for discharge so they can bring the car to the front of the building. Also all patient's will need to wear a mask into building.  

## 2020-03-04 ENCOUNTER — Encounter: Payer: Self-pay | Admitting: Gastroenterology

## 2020-03-04 ENCOUNTER — Other Ambulatory Visit: Payer: Self-pay

## 2020-03-04 ENCOUNTER — Ambulatory Visit (AMBULATORY_SURGERY_CENTER): Payer: BC Managed Care – PPO | Admitting: Gastroenterology

## 2020-03-04 VITALS — BP 115/74 | HR 84 | Temp 97.1°F | Resp 13 | Ht 63.0 in | Wt 190.0 lb

## 2020-03-04 DIAGNOSIS — D122 Benign neoplasm of ascending colon: Secondary | ICD-10-CM | POA: Diagnosis not present

## 2020-03-04 DIAGNOSIS — D120 Benign neoplasm of cecum: Secondary | ICD-10-CM | POA: Diagnosis not present

## 2020-03-04 DIAGNOSIS — Z1211 Encounter for screening for malignant neoplasm of colon: Secondary | ICD-10-CM | POA: Diagnosis present

## 2020-03-04 MED ORDER — SODIUM CHLORIDE 0.9 % IV SOLN: 500.0000 mL | Freq: Once | INTRAVENOUS | Status: DC

## 2020-03-04 NOTE — Progress Notes (Signed)
Dr Tarri Glenn aware of Zofran and Robinul

## 2020-03-04 NOTE — Progress Notes (Signed)
Report to PACU, RN, vss, BBS= Clear.  

## 2020-03-04 NOTE — Op Note (Signed)
[place] Patient Name: Karen [name] Procedure Date: 03/04/2020 9:46 AM MRN: [id number] Endoscopist: Thornton Park MD, MD Age: 59 Referring MD:  Date of Birth: 06-11-1960 Gender: Female Account #: [id number] Procedure:                Colonoscopy Indications:              Screening for colorectal malignant neoplasm, This                            is the patient's first colonoscopy                           No known family history of colon cancer or polyps Medicines:                Monitored Anesthesia Care Procedure:                Pre-Anesthesia Assessment:                           - Prior to the procedure, a History and Physical                            was performed, and patient medications and                            allergies were reviewed. The patient's tolerance of                            previous anesthesia was also reviewed. The risks                            and benefits of the procedure and the sedation                            options and risks were discussed with the patient.                            All questions were answered, and informed consent                            was obtained. Prior Anticoagulants: The patient has                            taken no previous anticoagulant or antiplatelet                            agents. ASA Grade Assessment: II - A patient with                            mild systemic disease. After reviewing the risks                            and benefits, the patient was deemed in  satisfactory condition to undergo the procedure.                           After obtaining informed consent, the colonoscope                            was passed under direct vision. Throughout the                            procedure, the patient's blood pressure, pulse, and                            oxygen saturations were monitored continuously. The                            Colonoscope was  introduced through the anus and                            advanced to the the cecum, identified by                            appendiceal orifice and ileocecal valve. The                            colonoscopy was performed with moderate difficulty                            due to a redundant colon, significant looping and a                            tortuous colon. Successful completion of the                            procedure was aided by changing the patient's                            position, withdrawing and reinserting the scope and                            applying abdominal pressure. The patient tolerated                            the procedure well. The quality of the bowel                            preparation was good. The terminal ileum, ileocecal                            valve, appendiceal orifice, and rectum were                            photographed. Scope In: 9:58:09 AM Scope Out: 10:17:07 AM Scope Withdrawal Time: 0 hours 9 minutes 37 seconds  Total Procedure Duration: 0 hours 18 minutes 58  seconds  Findings:                 The perianal and digital rectal examinations were                            normal. Small internal hemorrhoids are present on                            retroflex views of the rectum.                           Two sessile polyps were found in the ascending                            colon and cecum. The polyps were 2 mm in size.                            These polyps were removed with a cold snare.                            Resection and retrieval were complete. Estimated                            blood loss was minimal.                           The colon is tortuous and redundant. The exam was                            otherwise without abnormality on direct and                            retroflexion views. Complications:            No immediate complications. Estimated blood loss:                             Minimal. Estimated Blood Loss:     Estimated blood loss was minimal. Impression:               - Two 2 mm polyps in the ascending colon and in the                            cecum, removed with a cold snare. Resected and                            retrieved.                           - Tortuous and redundant colon.                           - The examination was otherwise normal on direct  and retroflexion views. Recommendation:           - Patient has a contact number available for                            emergencies. The signs and symptoms of potential                            delayed complications were discussed with the                            patient. Return to normal activities tomorrow.                            Written discharge instructions were provided to the                            patient.                           - Resume previous diet.                           - Continue present medications.                           - Await pathology results.                           - Repeat colonoscopy date to be determined after                            pending pathology results are reviewed for                            surveillance.                           - Emerging evidence supports eating a diet of                            fruits, vegetables, grains, calcium, and yogurt                            while reducing red meat and alcohol may reduce the                            risk of colon cancer.                           - Thank you for allowing me to be involved in your                            colon cancer prevention. Thornton Park MD, MD 03/04/2020 10:22:34 AM This report has been signed electronically.

## 2020-03-04 NOTE — Progress Notes (Signed)
Pt's states no medical or surgical changes since previsit or office visit. VS by HC. 

## 2020-03-04 NOTE — Patient Instructions (Signed)
Handout given for polyps.  Await pathology results.  YOU HAD AN ENDOSCOPIC PROCEDURE TODAY AT THE [facility]:   Refer to the procedure report that was given to you for any specific questions about what was found during the examination.  If the procedure report does not answer your questions, please call your gastroenterologist to clarify.  If you requested that your care partner not be given the details of your procedure findings, then the procedure report has been included in a sealed envelope for you to review at your convenience later.  YOU SHOULD EXPECT: Some feelings of bloating in the abdomen. Passage of more gas than usual.  Walking can help get rid of the air that was put into your GI tract during the procedure and reduce the bloating. If you had a lower endoscopy (such as a colonoscopy or flexible sigmoidoscopy) you may notice spotting of blood in your stool or on the toilet paper. If you underwent a bowel prep for your procedure, you may not have a normal bowel movement for a few days.  Please Note:  You might notice some irritation and congestion in your nose or some drainage.  This is from the oxygen used during your procedure.  There is no need for concern and it should clear up in a day or so.  SYMPTOMS TO REPORT IMMEDIATELY:   Following lower endoscopy (colonoscopy or flexible sigmoidoscopy):  Excessive amounts of blood in the stool  Significant tenderness or worsening of abdominal pains  Swelling of the abdomen that is new, acute  Fever of 100F or higher  For urgent or emergent issues, a gastroenterologist can be reached at any hour by calling [phone]. Do not use MyChart messaging for urgent concerns.    DIET:  We do recommend a small meal at first, but then you may proceed to your regular diet.  Drink plenty of fluids but you should avoid alcoholic beverages for 24 hours.  ACTIVITY:  You should plan to take it easy for the rest of today and you should  NOT DRIVE or use heavy machinery until tomorrow (because of the sedation medicines used during the test).    FOLLOW UP: Our staff will call the number listed on your records 48-72 hours following your procedure to check on you and address any questions or concerns that you may have regarding the information given to you following your procedure. If we do not reach you, we will leave a message.  We will attempt to reach you two times.  During this call, we will ask if you have developed any symptoms of COVID 19. If you develop any symptoms (ie: fever, flu-like symptoms, shortness of breath, cough etc.) before then, please call [phone].  If you test positive for Covid 19 in the 2 weeks post procedure, please call and report this information to us.    If any biopsies were taken you will be contacted by phone or by letter within the next 1-3 weeks.  Please call us at [phone] if you have not heard about the biopsies in 3 weeks.    SIGNATURES/CONFIDENTIALITY: You and/or your care partner have signed paperwork which will be entered into your electronic medical record.  These signatures attest to the fact that that the information above on your After Visit Summary has been reviewed and is understood.  Full responsibility of the confidentiality of this discharge information lies with you and/or your care-partner. 

## 2020-03-04 NOTE — Progress Notes (Signed)
Called to room to assist during endoscopic procedure.  Patient ID and intended procedure confirmed with present staff. Received instructions for my participation in the procedure from the performing physician.  

## 2020-03-08 ENCOUNTER — Telehealth: Payer: Self-pay

## 2020-03-08 NOTE — Telephone Encounter (Signed)
  Follow up Call-  Call back number 03/04/2020  Post procedure Call Back phone  # [phone]  Permission to leave phone message Yes  Some recent data might be hidden     Patient questions:  Do you have a fever, pain , or abdominal swelling? No. Pain Score  0 *  Have you tolerated food without any problems? Yes.    Have you been able to return to your normal activities? Yes.    Do you have any questions about your discharge instructions: Diet   No. Medications  No. Follow up visit  No.  Do you have questions or concerns about your Care? No.  Actions: * If pain score is 4 or above: No action needed, pain <4.   1. Have you developed a fever since your procedure? No   2.   Have you had an respiratory symptoms (SOB or cough) since your procedure? No   3.   Have you tested positive for COVID 19 since your procedure? No   4.   Have you had any family members/close contacts diagnosed with the COVID 19 since your procedure?  No    If yes to any of these questions please route to Joylene John, RN and Joella Prince, RN

## 2020-03-16 ENCOUNTER — Ambulatory Visit: Payer: BC Managed Care – PPO | Admitting: Orthopaedic Surgery

## 2020-03-16 ENCOUNTER — Encounter: Payer: Self-pay | Admitting: Gastroenterology

## 2020-03-24 ENCOUNTER — Other Ambulatory Visit: Payer: Self-pay

## 2020-03-24 ENCOUNTER — Encounter: Payer: Self-pay | Admitting: Orthopaedic Surgery

## 2020-03-24 ENCOUNTER — Ambulatory Visit: Payer: BC Managed Care – PPO | Admitting: Orthopaedic Surgery

## 2020-03-24 ENCOUNTER — Telehealth: Payer: Self-pay | Admitting: Gastroenterology

## 2020-03-24 DIAGNOSIS — M7061 Trochanteric bursitis, right hip: Secondary | ICD-10-CM | POA: Insufficient documentation

## 2020-03-24 MED ORDER — BUPIVACAINE HCL 0.5 % IJ SOLN
2.0000 mL | INTRAMUSCULAR | Status: AC | PRN
  Administered 2020-03-24: 2 mL via INTRA_ARTICULAR

## 2020-03-24 MED ORDER — LIDOCAINE HCL 1 % IJ SOLN
2.0000 mL | INTRAMUSCULAR | Status: AC | PRN
  Administered 2020-03-24: 2 mL

## 2020-03-24 NOTE — Progress Notes (Signed)
Office Visit Note   Patient: Karen [name]           Date of Birth: [birth year]           MRN: [id number] Visit Date: 03/24/2020              Requested by: Rutherford Guys, MD No address on file PCP: Rutherford Guys, MD (Inactive)   Assessment & Plan: Visit Diagnoses:  1. Trochanteric bursitis, right hip     Plan: Symptoms and exam are consistent with greater trochanteric bursitis right hip.  Pain is localized with some referred pain along the lateral thigh.  Will inject the area of tenderness with betamethasone and monitor response over the next several weeks.  Would consider diagnostic studies if no improvement including lumbar spine and pelvis.  Had films several years ago of the pelvis without any evidence of arthritic change.  There was some minor degenerative change in the lumbar spine at L4-5  Follow-Up Instructions: Return if symptoms worsen or fail to improve.   Orders:  Orders Placed This Encounter  Procedures  . Large Joint Inj: R greater trochanter   No orders of the defined types were placed in this encounter.     Procedures: Large Joint Inj: R greater trochanter on 03/24/2020 4:16 PM Indications: pain and diagnostic evaluation Details: 25 G 1.5 in needle  Arthrogram: No  Medications: 2 mL lidocaine 1 %; 2 mL bupivacaine 0.5 %  2 mL betamethasone Procedure, treatment alternatives, risks and benefits explained, specific risks discussed. Consent was given by the patient. Immediately prior to procedure a time out was called to verify the correct patient, procedure, equipment, support staff and site/side marked as required. Patient was prepped and draped in the usual sterile fashion.       Clinical Data: No additional findings.   Subjective: Chief Complaint  Patient presents with  . Right Hip - Pain  Patient presents today for right hip pain. She said that the pain started one month ago, with no injury. Her pain is located in her lateral thigh. No  lower back pain or buttock pain. No numbness or tingling in her legs. She cannot lay on that side. The pain is worse at night when she lays down or if she does a lot of walking. No previous hip surgery. She has tried muscle relaxers but that do not help.   HPI  Review of Systems   Objective: Vital Signs: Ht 5\' 3"  (1.6 m)   Wt 180 lb (81.6 kg)   BMI 31.89 kg/m   Physical Exam Constitutional:      Appearance: She is well-developed.  Eyes:     Pupils: Pupils are equal, round, and reactive to light.  Pulmonary:     Effort: Pulmonary effort is normal.  Skin:    General: Skin is warm and dry.  Neurological:     Mental Status: She is alert and oriented to person, place, and time.  Psychiatric:        Behavior: Behavior normal.     Ortho Exam awake alert and oriented x3.  Comfortable sitting and walking.  Does have local tenderness directly over the greater trochanter of her right hip.  A little bit of discomfort in that area with range of motion of her hip but no groin pain and no loss of motion of either hip.  Straight leg raise is negative.  No localized areas of tenderness about the thigh.  No percussible tenderness over either SI  joint.  Negative figure 4 test.  No pain along the lower lumbar spine.  Motor exam intact.  Walks without a limp Specialty Comments:  No specialty comments available.  Imaging: No results found.   PMFS History: Patient Active Problem List   Diagnosis Date Noted  . Trochanteric bursitis, right hip 03/24/2020  . Hyperlipidemia associated with type 2 diabetes mellitus ([place]) 12/30/2018  . Seasonal allergies 04/19/2017  . Gastroesophageal reflux disease 04/19/2017  . Diabetes mellitus ([place]) 02/14/2012  . Hypertension 02/14/2012   Past Medical History:  Diagnosis Date  . Allergy   . Anemia    past hx in her 80's  . Diabetes mellitus without complication ([place])   . GERD (gastroesophageal reflux disease)   . Hyperlipidemia    Controlled   .  Hypertension    Controlled     Family History  Problem Relation Age of Onset  . Cancer Mother 1       uterine cancer  . Uterine cancer Mother   . Heart disease Sister 6       AMI/CAD with stenting  . Hypertension Sister   . Breast cancer Maternal Aunt   . Colon cancer Neg Hx   . Colon polyps Neg Hx   . Esophageal cancer Neg Hx   . Stomach cancer Neg Hx   . Rectal cancer Neg Hx     Past Surgical History:  Procedure Laterality Date  . ABDOMINAL HYSTERECTOMY     PID; ovaries resected.  Age 37.  Marland Kitchen APPENDECTOMY     Social History   Occupational History  . Not on file  Tobacco Use  . Smoking status: Former Smoker    Packs/day: 0.70    Years: 30.00    Pack years: 21.00    Types: Cigarettes  . Smokeless tobacco: Never Used  Vaping Use  . Vaping Use: Never used  Substance and Sexual Activity  . Alcohol use: No  . Drug use: No  . Sexual activity: Yes    Birth control/protection: None

## 2020-03-25 NOTE — Telephone Encounter (Signed)
Left message for pt to call back.  Spoke with pt and discussed path result letter with pt and she is aware.

## 2020-04-21 ENCOUNTER — Other Ambulatory Visit: Payer: Self-pay | Admitting: Registered Nurse

## 2020-04-21 DIAGNOSIS — R1013 Epigastric pain: Secondary | ICD-10-CM

## 2020-04-21 NOTE — Telephone Encounter (Signed)
Requested Prescriptions  Pending Prescriptions Disp Refills  . esomeprazole (NEXIUM) 40 MG capsule [Pharmacy Med Name: Esomeprazole Magnesium 40 MG Oral Capsule Delayed Release] 90 capsule 0    Sig: Take 1 capsule by mouth once daily     Gastroenterology: Proton Pump Inhibitors Passed - 04/21/2020  9:50 AM      Passed - Valid encounter within last 12 months    Recent Outpatient Visits          2 months ago Type 2 diabetes mellitus with hyperglycemia, without long-term current use of insulin [facility])   [facility], Lilia Argue, MD   3 months ago Type 2 diabetes mellitus with hyperglycemia, without long-term current use of insulin [facility])   [facility], Lilia Argue, MD   4 months ago Hematuria, unspecified type   [facility], Delfino Lovett, NP   5 months ago Epigastric pain   [facility], Delfino Lovett, NP   5 months ago Sinus pressure   [facility], Delfino Lovett, NP      Future Appointments            In 3 weeks Sagardia, Ines Bloomer, MD Primary Care at [place], [facility]

## 2020-04-25 ENCOUNTER — Other Ambulatory Visit: Payer: Self-pay | Admitting: *Deleted

## 2020-04-25 MED ORDER — TRULICITY 1.5 MG/0.5ML ~~LOC~~ SOAJ
1.5000 mg | SUBCUTANEOUS | 3 refills | Status: DC
Start: 2020-04-25 — End: 2020-04-26

## 2020-04-26 ENCOUNTER — Other Ambulatory Visit: Payer: Self-pay

## 2020-04-26 ENCOUNTER — Ambulatory Visit: Payer: BC Managed Care – PPO | Admitting: Registered Nurse

## 2020-04-26 ENCOUNTER — Encounter: Payer: Self-pay | Admitting: Registered Nurse

## 2020-04-26 VITALS — BP 126/77 | HR 80 | Temp 97.9°F | Ht 63.0 in | Wt 183.2 lb

## 2020-04-26 DIAGNOSIS — E1169 Type 2 diabetes mellitus with other specified complication: Secondary | ICD-10-CM

## 2020-04-26 DIAGNOSIS — E1165 Type 2 diabetes mellitus with hyperglycemia: Secondary | ICD-10-CM

## 2020-04-26 DIAGNOSIS — E785 Hyperlipidemia, unspecified: Secondary | ICD-10-CM

## 2020-04-26 DIAGNOSIS — R1013 Epigastric pain: Secondary | ICD-10-CM | POA: Diagnosis not present

## 2020-04-26 DIAGNOSIS — G43919 Migraine, unspecified, intractable, without status migrainosus: Secondary | ICD-10-CM | POA: Diagnosis not present

## 2020-04-26 DIAGNOSIS — E119 Type 2 diabetes mellitus without complications: Secondary | ICD-10-CM

## 2020-04-26 DIAGNOSIS — M25512 Pain in left shoulder: Secondary | ICD-10-CM

## 2020-04-26 DIAGNOSIS — J302 Other seasonal allergic rhinitis: Secondary | ICD-10-CM

## 2020-04-26 LAB — POCT GLYCOSYLATED HEMOGLOBIN (HGB A1C): Hemoglobin A1C: 7.9 % — AB (ref 4.0–5.6)

## 2020-04-26 MED ORDER — CYCLOBENZAPRINE HCL 10 MG PO TABS: 10.0000 mg | ORAL_TABLET | Freq: Every day | ORAL | 2 refills | Status: DC

## 2020-04-26 MED ORDER — ATORVASTATIN CALCIUM 10 MG PO TABS: 10.0000 mg | ORAL_TABLET | Freq: Every day | ORAL | 3 refills | Status: DC

## 2020-04-26 MED ORDER — CETIRIZINE HCL 10 MG PO TABS: 10.0000 mg | ORAL_TABLET | Freq: Every day | ORAL | 3 refills | Status: DC

## 2020-04-26 MED ORDER — SUMATRIPTAN SUCCINATE 50 MG PO TABS: 50.0000 mg | ORAL_TABLET | ORAL | 3 refills | Status: DC | PRN

## 2020-04-26 MED ORDER — ESOMEPRAZOLE MAGNESIUM 40 MG PO CPDR: 40.0000 mg | DELAYED_RELEASE_CAPSULE | Freq: Every day | ORAL | 2 refills | Status: DC

## 2020-04-26 MED ORDER — DULAGLUTIDE 3 MG/0.5ML ~~LOC~~ SOAJ: 3.0000 mg | SUBCUTANEOUS | 1 refills | Status: DC

## 2020-04-26 NOTE — Progress Notes (Signed)
Established Patient Office Visit  Subjective:  Patient ID: Karen [name], female    DOB: August 27, 1960  Age: 59 y.o. MRN: [id number]  CC:  Chief Complaint  Patient presents with  . Medication Refill    check A1c and refills on meds. Today is not a TOC. Patient have a TOC on 05/18/20    HPI Karen [name] presents for t2dm follow up   Last a1c on 12/31/19 was 12.6 Patient has taken strict lifestyle precautions and has been extremely compliant with medications, including trulicity - of which she is a big fan. Notes that she will sometimes forget metformin - often only taking 525m PO qd rather than 10057mPO bid. Diet has generally been good and steady even through Thanksgiving No new side effects or concerns.   Past Medical History:  Diagnosis Date  . Allergy   . Anemia    past hx in her 201's. Diabetes mellitus without complication ([place]  . GERD (gastroesophageal reflux disease)   . Hyperlipidemia    Controlled   . Hypertension    Controlled     Past Surgical History:  Procedure Laterality Date  . ABDOMINAL HYSTERECTOMY     PID; ovaries resected.  Age 59 . Marland KitchenPPENDECTOMY      Family History  Problem Relation Age of Onset  . Cancer Mother 3350     uterine cancer  . Uterine cancer Mother   . Heart disease Sister 4314     AMI/CAD with stenting  . Hypertension Sister   . Breast cancer Maternal Aunt   . Colon cancer Neg Hx   . Colon polyps Neg Hx   . Esophageal cancer Neg Hx   . Stomach cancer Neg Hx   . Rectal cancer Neg Hx     Social History   Socioeconomic History  . Marital status: Divorced    Spouse name: Not on file  . Number of children: Not on file  . Years of education: Not on file  . Highest education level: Not on file  Occupational History  . Not on file  Tobacco Use  . Smoking status: Former Smoker    Packs/day: 0.70    Years: 30.00    Pack years: 21.00    Types: Cigarettes  . Smokeless tobacco: Never Used  Vaping Use  . Vaping  Use: Never used  Substance and Sexual Activity  . Alcohol use: No  . Drug use: No  . Sexual activity: Yes    Birth control/protection: None  Other Topics Concern  . Not on file  Social History Narrative   Marital status: divorced; not dating      Children:  None      Lives: alone      Employment: asRadio broadcast assistant     Tobacco: sometimes; smokes 1 pack per week       Alcohol:  None      Drugs: none      Exercise:  sporadic   Social Determinants of Health   Financial Resource Strain:   . Difficulty of Paying Living Expenses: Not on file  Food Insecurity:   . Worried About RuCharity fundraisern the Last Year: Not on file  . Ran Out of Food in the Last Year: Not on file  Transportation Needs:   . Lack of Transportation (Medical): Not on file  . Lack of Transportation (Non-Medical): Not on file  Physical Activity:   . Days  of Exercise per Week: Not on file  . Minutes of Exercise per Session: Not on file  Stress:   . Feeling of Stress : Not on file  Social Connections:   . Frequency of Communication with Friends and Family: Not on file  . Frequency of Social Gatherings with Friends and Family: Not on file  . Attends Religious Services: Not on file  . Active Member of Clubs or Organizations: Not on file  . Attends Archivist Meetings: Not on file  . Marital Status: Not on file  Intimate Partner Violence:   . Fear of Current or Ex-Partner: Not on file  . Emotionally Abused: Not on file  . Physically Abused: Not on file  . Sexually Abused: Not on file    Outpatient Medications Prior to Visit  Medication Sig Dispense Refill  . amLODipine (NORVASC) 5 MG tablet Take 1 tablet by mouth once daily 90 tablet 1  . blood glucose meter kit and supplies Per insurance coverage. Check blood glucose once a day. Dx E11.65 1 each 11  . atorvastatin (LIPITOR) 10 MG tablet Take 1 tablet (10 mg total) by mouth daily. 90 tablet 3  . cetirizine (ZYRTEC) 10 MG tablet Take 1 tablet  (10 mg total) by mouth daily. 90 tablet 3  . cyclobenzaprine (FLEXERIL) 10 MG tablet Take 1 tablet (10 mg total) by mouth at bedtime. 30 tablet 2  . Dulaglutide (TRULICITY) 1.5 EX/5.2WU SOPN Inject 1.5 mg into the skin once a week. 2 mL 3  . esomeprazole (NEXIUM) 40 MG capsule Take 1 capsule by mouth once daily 90 capsule 2  . SUMAtriptan (IMITREX) 50 MG tablet Take 1 tablet (50 mg total) by mouth every 2 (two) hours as needed for migraine (Max 2 doses daily). May repeat in 2 hours if headache persists or recurs. 10 tablet 3  . metFORMIN (GLUCOPHAGE) 500 MG tablet Take 2 tablets (1,000 mg total) by mouth 2 (two) times daily with a meal. 360 tablet 1   No facility-administered medications prior to visit.    Allergies  Allergen Reactions  . Orange Fruit [Citrus] Hives  . Penicillins Hives    ROS Review of Systems  Constitutional: Negative.   HENT: Negative.   Eyes: Negative.   Respiratory: Negative.   Cardiovascular: Negative.   Gastrointestinal: Negative.   Genitourinary: Negative.   Musculoskeletal: Negative.   Skin: Negative.   Neurological: Negative.   Psychiatric/Behavioral: Negative.       Objective:    Physical Exam Vitals and nursing note reviewed.  Constitutional:      General: She is not in acute distress.    Appearance: Normal appearance. She is normal weight. She is not ill-appearing, toxic-appearing or diaphoretic.  Cardiovascular:     Rate and Rhythm: Normal rate and regular rhythm.     Heart sounds: Normal heart sounds. No murmur heard.  No friction rub. No gallop.   Pulmonary:     Effort: Pulmonary effort is normal. No respiratory distress.     Breath sounds: Normal breath sounds. No stridor. No wheezing, rhonchi or rales.  Chest:     Chest wall: No tenderness.  Skin:    General: Skin is warm and dry.  Neurological:     General: No focal deficit present.     Mental Status: She is alert and oriented to person, place, and time. Mental status is at  baseline.  Psychiatric:        Mood and Affect: Mood normal.  Behavior: Behavior normal.        Thought Content: Thought content normal.        Judgment: Judgment normal.     BP 126/77 (BP Location: Right Arm, Patient Position: Sitting, Cuff Size: Large)   Pulse 80   Temp 97.9 F (36.6 C) (Temporal)   Ht 5' 3"  (1.6 m)   Wt 183 lb 3.2 oz (83.1 kg)   SpO2 100%   BMI 32.45 kg/m  Wt Readings from Last 3 Encounters:  04/26/20 183 lb 3.2 oz (83.1 kg)  03/24/20 180 lb (81.6 kg)  03/04/20 190 lb (86.2 kg)     There are no preventive care reminders to display for this patient.  There are no preventive care reminders to display for this patient.  Lab Results  Component Value Date   TSH 1.150 04/19/2017   Lab Results  Component Value Date   WBC 9.9 11/17/2019   HGB 12.8 11/17/2019   HCT 40.0 11/17/2019   MCV 73 (L) 11/17/2019   PLT 350 11/17/2019   Lab Results  Component Value Date   NA 138 12/31/2019   K 4.3 12/31/2019   CO2 26 12/31/2019   GLUCOSE 295 (H) 12/31/2019   BUN 7 12/31/2019   CREATININE 0.88 12/31/2019   BILITOT 0.2 12/31/2019   ALKPHOS 138 (H) 12/31/2019   AST 15 12/31/2019   ALT 16 12/31/2019   PROT 8.1 12/31/2019   ALBUMIN 4.1 12/31/2019   CALCIUM 9.8 12/31/2019   ANIONGAP 9 06/25/2014   Lab Results  Component Value Date   CHOL 160 12/31/2019   Lab Results  Component Value Date   HDL 49 12/31/2019   Lab Results  Component Value Date   LDLCALC 89 12/31/2019   Lab Results  Component Value Date   TRIG 126 12/31/2019   Lab Results  Component Value Date   CHOLHDL 3.3 12/31/2019   Lab Results  Component Value Date   HGBA1C 7.9 (A) 04/26/2020      Assessment & Plan:   Problem List Items Addressed This Visit      Endocrine   Diabetes mellitus ([place]) - Primary   Relevant Medications   atorvastatin (LIPITOR) 10 MG tablet   Dulaglutide 3 MG/0.5ML SOPN   Other Relevant Orders   POCT glycosylated hemoglobin (Hb A1C)  (Completed)   Hyperlipidemia associated with type 2 diabetes mellitus ([facility])   Relevant Medications   atorvastatin (LIPITOR) 10 MG tablet   Dulaglutide 3 MG/0.5ML SOPN     Other   Seasonal allergies   Relevant Medications   cetirizine (ZYRTEC) 10 MG tablet    Other Visit Diagnoses    Epigastric pain       Relevant Medications   esomeprazole (NEXIUM) 40 MG capsule   Intractable migraine without status migrainosus, unspecified migraine type       Relevant Medications   atorvastatin (LIPITOR) 10 MG tablet   cyclobenzaprine (FLEXERIL) 10 MG tablet   SUMAtriptan (IMITREX) 50 MG tablet   Pain in joint of left shoulder       Relevant Medications   cyclobenzaprine (FLEXERIL) 10 MG tablet      Meds ordered this encounter  Medications  . atorvastatin (LIPITOR) 10 MG tablet    Sig: Take 1 tablet (10 mg total) by mouth daily.    Dispense:  90 tablet    Refill:  3  . cetirizine (ZYRTEC) 10 MG tablet    Sig: Take 1 tablet (10 mg total) by mouth daily.    Dispense:  90 tablet    Refill:  3  . cyclobenzaprine (FLEXERIL) 10 MG tablet    Sig: Take 1 tablet (10 mg total) by mouth at bedtime.    Dispense:  30 tablet    Refill:  2  . esomeprazole (NEXIUM) 40 MG capsule    Sig: Take 1 capsule (40 mg total) by mouth daily.    Dispense:  90 capsule    Refill:  2  . SUMAtriptan (IMITREX) 50 MG tablet    Sig: Take 1 tablet (50 mg total) by mouth every 2 (two) hours as needed for migraine (Max 2 doses daily). May repeat in 2 hours if headache persists or recurs.    Dispense:  10 tablet    Refill:  3  . Dulaglutide 3 MG/0.5ML SOPN    Sig: Inject 3 mg into the skin once a week.    Dispense:  6 mL    Refill:  1    Order Specific Question:   Supervising Provider    Answer:   Carlota Raspberry, JEFFREY R [[id number]]    Follow-up: No follow-ups on file.   PLAN  a1c today at 7.9 - great improvement. Patient encouraged to keep up good work  Will withdraw metformin and increase trulicity  Other  medications refilled as above  Follow up with TOC with Sagardia in late Dec  Return in 3-6 mos for t2dm - depending on what Dr. Mitchel Honour prefers  Patient encouraged to call clinic with any questions, comments, or concerns.  Maximiano Coss, NP

## 2020-04-26 NOTE — Patient Instructions (Signed)
° ° ° °  If you have lab work done today you will be contacted with your lab results within the next 2 weeks.  If you have not heard from us then please contact us. The fastest way to get your results is to register for My Chart. ° ° °IF you received an x-ray today, you will receive an invoice from [facility]. Please contact [facility] at [phone] with questions or concerns regarding your invoice.  ° °IF you received labwork today, you will receive an invoice from [facility]. Please contact [facility] at [phone] with questions or concerns regarding your invoice.  ° °Our billing staff will not be able to assist you with questions regarding bills from these companies. ° °You will be contacted with the lab results as soon as they are available. The fastest way to get your results is to activate your My Chart account. Instructions are located on the last page of this paperwork. If you have not heard from us regarding the results in 2 weeks, please contact this office. °  ° ° ° °

## 2020-05-18 ENCOUNTER — Ambulatory Visit: Payer: BC Managed Care – PPO | Admitting: Emergency Medicine

## 2020-05-18 ENCOUNTER — Encounter: Payer: Self-pay | Admitting: Emergency Medicine

## 2020-05-18 ENCOUNTER — Other Ambulatory Visit: Payer: Self-pay

## 2020-05-18 VITALS — BP 112/76 | HR 94 | Temp 98.5°F | Resp 16 | Ht 63.0 in | Wt 182.0 lb

## 2020-05-18 DIAGNOSIS — G4700 Insomnia, unspecified: Secondary | ICD-10-CM

## 2020-05-18 DIAGNOSIS — E1165 Type 2 diabetes mellitus with hyperglycemia: Secondary | ICD-10-CM

## 2020-05-18 DIAGNOSIS — I152 Hypertension secondary to endocrine disorders: Secondary | ICD-10-CM

## 2020-05-18 DIAGNOSIS — E1159 Type 2 diabetes mellitus with other circulatory complications: Secondary | ICD-10-CM | POA: Diagnosis not present

## 2020-05-18 DIAGNOSIS — Z7689 Persons encountering health services in other specified circumstances: Secondary | ICD-10-CM | POA: Diagnosis not present

## 2020-05-18 DIAGNOSIS — E785 Hyperlipidemia, unspecified: Secondary | ICD-10-CM

## 2020-05-18 DIAGNOSIS — E1169 Type 2 diabetes mellitus with other specified complication: Secondary | ICD-10-CM | POA: Diagnosis not present

## 2020-05-18 NOTE — Progress Notes (Signed)
Karen [name] 59 y.o.   Chief Complaint  Patient presents with  . Transitions Of Care    Former Dr Pamella Pert  . Hypertension    And diabetes  . Diabetes  . Insomnia    Per patient for 2 weeks    HISTORY OF PRESENT ILLNESS: This is a 59 y.o. female with history of diabetes and hypertension here to establish care with me.  Used to see Dr. Pamella Pert. #1 diabetes: On Trulicity 3 mg weekly.  Doing much better. Lab Results  Component Value Date   HGBA1C 7.9 (A) 04/26/2020  #2 hypertension: Off amlodipine for 2 to 3 months with normal blood pressure readings. BP Readings from Last 3 Encounters:  05/18/20 112/76  04/26/20 126/77  03/04/20 115/74  #3 dyslipidemia: On atorvastatin 10 mg daily. Losing weight and eating better. Complaining of insomnia for the past 2 weeks.  Unknown cause. No other complaints or medical concerns today. Fully vaccinated against Covid with a booster.   HPI   Prior to Admission medications   Medication Sig Start Date End Date Taking? Authorizing Provider  atorvastatin (LIPITOR) 10 MG tablet Take 1 tablet (10 mg total) by mouth daily. 04/26/20  Yes Maximiano Coss, NP  cetirizine (ZYRTEC) 10 MG tablet Take 1 tablet (10 mg total) by mouth daily. 04/26/20  Yes Maximiano Coss, NP  cyclobenzaprine (FLEXERIL) 10 MG tablet Take 1 tablet (10 mg total) by mouth at bedtime. 04/26/20  Yes Maximiano Coss, NP  Dulaglutide 3 MG/0.5ML SOPN Inject 3 mg into the skin once a week. 04/26/20  Yes Maximiano Coss, NP  esomeprazole (NEXIUM) 40 MG capsule Take 1 capsule (40 mg total) by mouth daily. 04/26/20  Yes Maximiano Coss, NP  SUMAtriptan (IMITREX) 50 MG tablet Take 1 tablet (50 mg total) by mouth every 2 (two) hours as needed for migraine (Max 2 doses daily). May repeat in 2 hours if headache persists or recurs. 04/26/20  Yes Maximiano Coss, NP  amLODipine (NORVASC) 5 MG tablet Take 1 tablet by mouth once daily Patient not taking: Reported on 05/18/2020 09/26/19   Jacelyn Pi, Lilia Argue, MD  blood glucose meter kit and supplies Per insurance coverage. Check blood glucose once a day. Dx E11.65 02/15/20   Daleen Squibb, MD    Allergies  Allergen Reactions  . Orange Fruit [Citrus] Hives  . Penicillins Hives    Patient Active Problem List   Diagnosis Date Noted  . Hyperlipidemia associated with type 2 diabetes mellitus ([place]) 12/30/2018  . Seasonal allergies 04/19/2017  . Gastroesophageal reflux disease 04/19/2017  . Diabetes mellitus ([place]) 02/14/2012  . Hypertension 02/14/2012    Past Medical History:  Diagnosis Date  . Allergy   . Anemia    past hx in her 57's  . Diabetes mellitus without complication ([place])   . GERD (gastroesophageal reflux disease)   . Hyperlipidemia    Controlled   . Hypertension    Controlled     Past Surgical History:  Procedure Laterality Date  . ABDOMINAL HYSTERECTOMY     PID; ovaries resected.  Age 72.  Marland Kitchen APPENDECTOMY      Social History   Socioeconomic History  . Marital status: Divorced    Spouse name: Not on file  . Number of children: Not on file  . Years of education: Not on file  . Highest education level: Not on file  Occupational History  . Not on file  Tobacco Use  . Smoking status: Former Smoker  Packs/day: 0.70    Years: 30.00    Pack years: 21.00    Types: Cigarettes  . Smokeless tobacco: Never Used  Vaping Use  . Vaping Use: Never used  Substance and Sexual Activity  . Alcohol use: No  . Drug use: No  . Sexual activity: Yes    Birth control/protection: None  Other Topics Concern  . Not on file  Social History Narrative   Marital status: divorced; not dating      Children:  None      Lives: alone      Employment: Radio broadcast assistant       Tobacco: sometimes; smokes 1 pack per week       Alcohol:  None      Drugs: none      Exercise:  sporadic   Social Determinants of Radio broadcast assistant Strain: Not on file  Food Insecurity: Not on file  Transportation Needs:  Not on file  Physical Activity: Not on file  Stress: Not on file  Social Connections: Not on file  Intimate Partner Violence: Not on file    Family History  Problem Relation Age of Onset  . Cancer Mother 38       uterine cancer  . Uterine cancer Mother   . Heart disease Sister 63       AMI/CAD with stenting  . Hypertension Sister   . Breast cancer Maternal Aunt   . Colon cancer Neg Hx   . Colon polyps Neg Hx   . Esophageal cancer Neg Hx   . Stomach cancer Neg Hx   . Rectal cancer Neg Hx      Review of Systems  Constitutional: Negative.  Negative for chills and fever.  HENT: Negative.  Negative for congestion and sore throat.   Respiratory: Negative.  Negative for cough and shortness of breath.   Cardiovascular: Negative.  Negative for chest pain and palpitations.  Gastrointestinal: Negative.  Negative for abdominal pain, diarrhea, nausea and vomiting.  Genitourinary: Negative.  Negative for dysuria and hematuria.  Musculoskeletal: Negative.  Negative for back pain, myalgias and neck pain.  Skin: Negative.  Negative for rash.  Neurological: Negative.  Negative for dizziness and headaches.  All other systems reviewed and are negative.  Today's Vitals   05/18/20 0929  BP: 112/76  Pulse: 94  Resp: 16  Temp: 98.5 F (36.9 C)  TempSrc: Temporal  SpO2: 99%  Weight: 182 lb (82.6 kg)  Height: 5' 3"  (1.6 m)   Body mass index is 32.24 kg/m. Wt Readings from Last 3 Encounters:  05/18/20 182 lb (82.6 kg)  04/26/20 183 lb 3.2 oz (83.1 kg)  03/24/20 180 lb (81.6 kg)     Physical Exam Vitals reviewed.  Constitutional:      Appearance: Normal appearance.  HENT:     Head: Normocephalic.  Eyes:     Extraocular Movements: Extraocular movements intact.     Conjunctiva/sclera: Conjunctivae normal.     Pupils: Pupils are equal, round, and reactive to light.  Cardiovascular:     Rate and Rhythm: Normal rate and regular rhythm.     Pulses: Normal pulses.     Heart sounds:  Normal heart sounds.  Pulmonary:     Effort: Pulmonary effort is normal.     Breath sounds: Normal breath sounds.  Musculoskeletal:        General: Normal range of motion.     Cervical back: Normal range of motion and neck supple.  Skin:  General: Skin is warm and dry.     Capillary Refill: Capillary refill takes less than 2 seconds.  Neurological:     General: No focal deficit present.     Mental Status: She is alert and oriented to person, place, and time.  Psychiatric:        Mood and Affect: Mood normal.        Behavior: Behavior normal.      ASSESSMENT & PLAN: Hypertension associated with diabetes ([place]) Normal blood pressure readings in the office at home off medication.  No need for amlodipine.  Will discontinue. Much improved diabetes on Trulicity.  Losing weight with better nutrition. Continue statin therapy with atorvastatin 10 mg daily. Follow-up in 3 months.  Karen [name] was seen today for transitions of care, hypertension, diabetes and insomnia.  Diagnoses and all orders for this visit:  Hypertension associated with diabetes ([place])  Type 2 diabetes mellitus with hyperglycemia, without long-term current use of insulin ([place])  Hyperlipidemia associated with type 2 diabetes mellitus ([place])  Encounter to establish care  Insomnia, unspecified type    Patient Instructions       If you have lab work done today you will be contacted with your lab results within the next 2 weeks.  If you have not heard from [place] then please contact us. The fastest way to get your results is to register for My Chart.   IF you received an x-ray today, you will receive an invoice from [facility]. Please contact [facility] at [phone] with questions or concerns regarding your invoice.   IF you received labwork today, you will receive an invoice from [facility]. Please contact [facility] at [phone] with questions or concerns regarding your invoice.   Our billing  staff will not be able to assist you with questions regarding bills from these companies.  You will be contacted with the lab results as soon as they are available. The fastest way to get your results is to activate your My Chart account. Instructions are located on the last page of this paperwork. If you have not heard from [place] regarding the results in 2 weeks, please contact this office.     Insomnia Insomnia is a sleep disorder that makes it difficult to fall asleep or stay asleep. Insomnia can cause fatigue, low energy, difficulty concentrating, mood swings, and poor performance at work or school. There are three different ways to classify insomnia:  Difficulty falling asleep.  Difficulty staying asleep.  Waking up too early in the morning. Any type of insomnia can be long-term (chronic) or short-term (acute). Both are common. Short-term insomnia usually lasts for three months or less. Chronic insomnia occurs at least three times a week for longer than three months. What are the causes? Insomnia may be caused by another condition, situation, or substance, such as:  Anxiety.  Certain medicines.  Gastroesophageal reflux disease (GERD) or other gastrointestinal conditions.  Asthma or other breathing conditions.  Restless legs syndrome, sleep apnea, or other sleep disorders.  Chronic pain.  Menopause.  Stroke.  Abuse of alcohol, tobacco, or illegal drugs.  Mental health conditions, such as depression.  Caffeine.  Neurological disorders, such as Alzheimer's disease.  An overactive thyroid (hyperthyroidism). Sometimes, the cause of insomnia may not be known. What increases the risk? Risk factors for insomnia include:  Gender. Women are affected more often than men.  Age. Insomnia is more common as you get older.  Stress.  Lack of exercise.  Irregular work schedule or working  night shifts.  Traveling between different time zones.  Certain medical and mental  health conditions. What are the signs or symptoms? If you have insomnia, the main symptom is having trouble falling asleep or having trouble staying asleep. This may lead to other symptoms, such as:  Feeling fatigued or having low energy.  Feeling nervous about going to sleep.  Not feeling rested in the morning.  Having trouble concentrating.  Feeling irritable, anxious, or depressed. How is this diagnosed? This condition may be diagnosed based on:  Your symptoms and medical history. Your health care provider may ask about: ? Your sleep habits. ? Any medical conditions you have. ? Your mental health.  A physical exam. How is this treated? Treatment for insomnia depends on the cause. Treatment may focus on treating an underlying condition that is causing insomnia. Treatment may also include:  Medicines to help you sleep.  Counseling or therapy.  Lifestyle adjustments to help you sleep better. Follow these instructions at home: Eating and drinking   Limit or avoid alcohol, caffeinated beverages, and cigarettes, especially close to bedtime. These can disrupt your sleep.  Do not eat a large meal or eat spicy foods right before bedtime. This can lead to digestive discomfort that can make it hard for you to sleep. Sleep habits   Keep a sleep diary to help you and your health care provider figure out what could be causing your insomnia. Write down: ? When you sleep. ? When you wake up during the night. ? How well you sleep. ? How rested you feel the next day. ? Any side effects of medicines you are taking. ? What you eat and drink.  Make your bedroom a dark, comfortable place where it is easy to fall asleep. ? Put up shades or blackout curtains to block light from outside. ? Use a white noise machine to block noise. ? Keep the temperature cool.  Limit screen use before bedtime. This includes: ? Watching TV. ? Using your smartphone, tablet, or computer.  Stick to a  routine that includes going to bed and waking up at the same times every day and night. This can help you fall asleep faster. Consider making a quiet activity, such as reading, part of your nighttime routine.  Try to avoid taking naps during the day so that you sleep better at night.  Get out of bed if you are still awake after 15 minutes of trying to sleep. Keep the lights down, but try reading or doing a quiet activity. When you feel sleepy, go back to bed. General instructions  Take over-the-counter and prescription medicines only as told by your health care provider.  Exercise regularly, as told by your health care provider. Avoid exercise starting several hours before bedtime.  Use relaxation techniques to manage stress. Ask your health care provider to suggest some techniques that may work well for you. These may include: ? Breathing exercises. ? Routines to release muscle tension. ? Visualizing peaceful scenes.  Make sure that you drive carefully. Avoid driving if you feel very sleepy.  Keep all follow-up visits as told by your health care provider. This is important. Contact a health care provider if:  You are tired throughout the day.  You have trouble in your daily routine due to sleepiness.  You continue to have sleep problems, or your sleep problems get worse. Get help right away if:  You have serious thoughts about hurting yourself or someone else. If you ever feel like you  may hurt yourself or others, or have thoughts about taking your own life, get help right away. You can go to your nearest emergency department or call:  Your local emergency services (911 in the [place]  A suicide crisis helpline, such as the [place] at 212-871-7555. This is open 24 hours a day. Summary  Insomnia is a sleep disorder that makes it difficult to fall asleep or stay asleep.  Insomnia can be long-term (chronic) or short-term (acute).  Treatment for  insomnia depends on the cause. Treatment may focus on treating an underlying condition that is causing insomnia.  Keep a sleep diary to help you and your health care provider figure out what could be causing your insomnia. This information is not intended to replace advice given to you by your health care provider. Make sure you discuss any questions you have with your health care provider. Document Revised: 04/19/2017 Document Reviewed: 02/14/2017 Elsevier Patient Education  2020 [place] Maintenance, Female Adopting a healthy lifestyle and getting preventive care are important in promoting health and wellness. Ask your health care provider about:  The right schedule for you to have regular tests and exams.  Things you can do on your own to prevent diseases and keep yourself healthy. What should I know about diet, weight, and exercise? Eat a healthy diet   Eat a diet that includes plenty of vegetables, fruits, low-fat dairy products, and lean protein.  Do not eat a lot of foods that are high in solid fats, added sugars, or sodium. Maintain a healthy weight Body mass index (BMI) is used to identify weight problems. It estimates body fat based on height and weight. Your health care provider can help determine your BMI and help you achieve or maintain a healthy weight. Get regular exercise Get regular exercise. This is one of the most important things you can do for your health. Most adults should:  Exercise for at least 150 minutes each week. The exercise should increase your heart rate and make you sweat (moderate-intensity exercise).  Do strengthening exercises at least twice a week. This is in addition to the moderate-intensity exercise.  Spend less time sitting. Even light physical activity can be beneficial. Watch cholesterol and blood lipids Have your blood tested for lipids and cholesterol at 58 years of age, then have this test every 5 years. Have your cholesterol  levels checked more often if:  Your lipid or cholesterol levels are high.  You are older than 58 years of age.  You are at high risk for heart disease. What should I know about cancer screening? Depending on your health history and family history, you may need to have cancer screening at various ages. This may include screening for:  Breast cancer.  Cervical cancer.  Colorectal cancer.  Skin cancer.  Lung cancer. What should I know about heart disease, diabetes, and high blood pressure? Blood pressure and heart disease  High blood pressure causes heart disease and increases the risk of stroke. This is more likely to develop in people who have high blood pressure readings, are of African descent, or are overweight.  Have your blood pressure checked: ? Every 3-5 years if you are 57-64 years of age. ? Every year if you are 86 years old or older. Diabetes Have regular diabetes screenings. This checks your fasting blood sugar level. Have the screening done:  Once every three years after age 43 if you are at a normal weight and have a  low risk for diabetes.  More often and at a younger age if you are overweight or have a high risk for diabetes. What should I know about preventing infection? Hepatitis B If you have a higher risk for hepatitis B, you should be screened for this virus. Talk with your health care provider to find out if you are at risk for hepatitis B infection. Hepatitis C Testing is recommended for:  Everyone born from [birth year] through 1964.  Anyone with known risk factors for hepatitis C. Sexually transmitted infections (STIs)  Get screened for STIs, including gonorrhea and chlamydia, if: ? You are sexually active and are younger than 58 years of age. ? You are older than 58 years of age and your health care provider tells you that you are at risk for this type of infection. ? Your sexual activity has changed since you were last screened, and you are at increased  risk for chlamydia or gonorrhea. Ask your health care provider if you are at risk.  Ask your health care provider about whether you are at high risk for HIV. Your health care provider may recommend a prescription medicine to help prevent HIV infection. If you choose to take medicine to prevent HIV, you should first get tested for HIV. You should then be tested every 3 months for as long as you are taking the medicine. Pregnancy  If you are about to stop having your period (premenopausal) and you may become pregnant, seek counseling before you get pregnant.  Take 400 to 800 micrograms (mcg) of folic acid every day if you become pregnant.  Ask for birth control (contraception) if you want to prevent pregnancy. Osteoporosis and menopause Osteoporosis is a disease in which the bones lose minerals and strength with aging. This can result in bone fractures. If you are 71 years old or older, or if you are at risk for osteoporosis and fractures, ask your health care provider if you should:  Be screened for bone loss.  Take a calcium or vitamin D supplement to lower your risk of fractures.  Be given hormone replacement therapy (HRT) to treat symptoms of menopause. Follow these instructions at home: Lifestyle  Do not use any products that contain nicotine or tobacco, such as cigarettes, e-cigarettes, and chewing tobacco. If you need help quitting, ask your health care provider.  Do not use street drugs.  Do not share needles.  Ask your health care provider for help if you need support or information about quitting drugs. Alcohol use  Do not drink alcohol if: ? Your health care provider tells you not to drink. ? You are pregnant, may be pregnant, or are planning to become pregnant.  If you drink alcohol: ? Limit how much you use to 0-1 drink a day. ? Limit intake if you are breastfeeding.  Be aware of how much alcohol is in your drink. In the [place] one drink equals one 12 oz bottle of beer  (355 mL), one 5 oz glass of wine (148 mL), or one 1 oz glass of hard liquor (44 mL). General instructions  Schedule regular health, dental, and eye exams.  Stay current with your vaccines.  Tell your health care provider if: ? You often feel depressed. ? You have ever been abused or do not feel safe at home. Summary  Adopting a healthy lifestyle and getting preventive care are important in promoting health and wellness.  Follow your health care provider's instructions about healthy diet, exercising, and getting tested or  screened for diseases.  Follow your health care provider's instructions on monitoring your cholesterol and blood pressure. This information is not intended to replace advice given to you by your health care provider. Make sure you discuss any questions you have with your health care provider. Document Revised: 04/30/2018 Document Reviewed: 04/30/2018 Elsevier Patient Education  2020 Elsevier Inc.      Agustina Caroli, MD [facility]

## 2020-05-18 NOTE — Patient Instructions (Addendum)
   If you have lab work done today you will be contacted with your lab results within the next 2 weeks.  If you have not heard from us then please contact us. The fastest way to get your results is to register for My Chart.   IF you received an x-ray today, you will receive an invoice from [facility]. Please contact [facility] at [phone] with questions or concerns regarding your invoice.   IF you received labwork today, you will receive an invoice from [facility]. Please contact [facility] at [phone] with questions or concerns regarding your invoice.   Our billing staff will not be able to assist you with questions regarding bills from these companies.  You will be contacted with the lab results as soon as they are available. The fastest way to get your results is to activate your My Chart account. Instructions are located on the last page of this paperwork. If you have not heard from us regarding the results in 2 weeks, please contact this office.     Insomnia Insomnia is a sleep disorder that makes it difficult to fall asleep or stay asleep. Insomnia can cause fatigue, low energy, difficulty concentrating, mood swings, and poor performance at work or school. There are three different ways to classify insomnia:  Difficulty falling asleep.  Difficulty staying asleep.  Waking up too early in the morning. Any type of insomnia can be long-term (chronic) or short-term (acute). Both are common. Short-term insomnia usually lasts for three months or less. Chronic insomnia occurs at least three times a week for longer than three months. What are the causes? Insomnia may be caused by another condition, situation, or substance, such as:  Anxiety.  Certain medicines.  Gastroesophageal reflux disease (GERD) or other gastrointestinal conditions.  Asthma or other breathing conditions.  Restless legs syndrome, sleep apnea, or other sleep disorders.  Chronic  pain.  Menopause.  Stroke.  Abuse of alcohol, tobacco, or illegal drugs.  Mental health conditions, such as depression.  Caffeine.  Neurological disorders, such as Alzheimer's disease.  An overactive thyroid (hyperthyroidism). Sometimes, the cause of insomnia may not be known. What increases the risk? Risk factors for insomnia include:  Gender. Women are affected more often than men.  Age. Insomnia is more common as you get older.  Stress.  Lack of exercise.  Irregular work schedule or working night shifts.  Traveling between different time zones.  Certain medical and mental health conditions. What are the signs or symptoms? If you have insomnia, the main symptom is having trouble falling asleep or having trouble staying asleep. This may lead to other symptoms, such as:  Feeling fatigued or having low energy.  Feeling nervous about going to sleep.  Not feeling rested in the morning.  Having trouble concentrating.  Feeling irritable, anxious, or depressed. How is this diagnosed? This condition may be diagnosed based on:  Your symptoms and medical history. Your health care provider may ask about: ? Your sleep habits. ? Any medical conditions you have. ? Your mental health.  A physical exam. How is this treated? Treatment for insomnia depends on the cause. Treatment may focus on treating an underlying condition that is causing insomnia. Treatment may also include:  Medicines to help you sleep.  Counseling or therapy.  Lifestyle adjustments to help you sleep better. Follow these instructions at home: Eating and drinking   Limit or avoid alcohol, caffeinated beverages, and cigarettes, especially close to bedtime. These can disrupt your sleep.    Do not eat a large meal or eat spicy foods right before bedtime. This can lead to digestive discomfort that can make it hard for you to sleep. Sleep habits   Keep a sleep diary to help you and your health care  provider figure out what could be causing your insomnia. Write down: ? When you sleep. ? When you wake up during the night. ? How well you sleep. ? How rested you feel the next day. ? Any side effects of medicines you are taking. ? What you eat and drink.  Make your bedroom a dark, comfortable place where it is easy to fall asleep. ? Put up shades or blackout curtains to block light from outside. ? Use a white noise machine to block noise. ? Keep the temperature cool.  Limit screen use before bedtime. This includes: ? Watching TV. ? Using your smartphone, tablet, or computer.  Stick to a routine that includes going to bed and waking up at the same times every day and night. This can help you fall asleep faster. Consider making a quiet activity, such as reading, part of your nighttime routine.  Try to avoid taking naps during the day so that you sleep better at night.  Get out of bed if you are still awake after 15 minutes of trying to sleep. Keep the lights down, but try reading or doing a quiet activity. When you feel sleepy, go back to bed. General instructions  Take over-the-counter and prescription medicines only as told by your health care provider.  Exercise regularly, as told by your health care provider. Avoid exercise starting several hours before bedtime.  Use relaxation techniques to manage stress. Ask your health care provider to suggest some techniques that may work well for you. These may include: ? Breathing exercises. ? Routines to release muscle tension. ? Visualizing peaceful scenes.  Make sure that you drive carefully. Avoid driving if you feel very sleepy.  Keep all follow-up visits as told by your health care provider. This is important. Contact a health care provider if:  You are tired throughout the day.  You have trouble in your daily routine due to sleepiness.  You continue to have sleep problems, or your sleep problems get worse. Get help right  away if:  You have serious thoughts about hurting yourself or someone else. If you ever feel like you may hurt yourself or others, or have thoughts about taking your own life, get help right away. You can go to your nearest emergency department or call:  Your local emergency services (911 in the [place]  A suicide crisis helpline, such as the National Suicide Prevention Lifeline at 262-099-6366. This is open 24 hours a day. Summary  Insomnia is a sleep disorder that makes it difficult to fall asleep or stay asleep.  Insomnia can be long-term (chronic) or short-term (acute).  Treatment for insomnia depends on the cause. Treatment may focus on treating an underlying condition that is causing insomnia.  Keep a sleep diary to help you and your health care provider figure out what could be causing your insomnia. This information is not intended to replace advice given to you by your health care provider. Make sure you discuss any questions you have with your health care provider. Document Revised: 04/19/2017 Document Reviewed: 02/14/2017 Elsevier Patient Education  2020 [employer].  Health Maintenance, Female Adopting a healthy lifestyle and getting preventive care are important in promoting health and wellness. Ask your health care provider about:  The right schedule for you to have regular tests and exams.  Things you can do on your own to prevent diseases and keep yourself healthy. What should I know about diet, weight, and exercise? Eat a healthy diet   Eat a diet that includes plenty of vegetables, fruits, low-fat dairy products, and lean protein.  Do not eat a lot of foods that are high in solid fats, added sugars, or sodium. Maintain a healthy weight Body mass index (BMI) is used to identify weight problems. It estimates body fat based on height and weight. Your health care provider can help determine your BMI and help you achieve or maintain a healthy weight. Get regular  exercise Get regular exercise. This is one of the most important things you can do for your health. Most adults should:  Exercise for at least 150 minutes each week. The exercise should increase your heart rate and make you sweat (moderate-intensity exercise).  Do strengthening exercises at least twice a week. This is in addition to the moderate-intensity exercise.  Spend less time sitting. Even light physical activity can be beneficial. Watch cholesterol and blood lipids Have your blood tested for lipids and cholesterol at 58 years of age, then have this test every 5 years. Have your cholesterol levels checked more often if:  Your lipid or cholesterol levels are high.  You are older than 58 years of age.  You are at high risk for heart disease. What should I know about cancer screening? Depending on your health history and family history, you may need to have cancer screening at various ages. This may include screening for:  Breast cancer.  Cervical cancer.  Colorectal cancer.  Skin cancer.  Lung cancer. What should I know about heart disease, diabetes, and high blood pressure? Blood pressure and heart disease  High blood pressure causes heart disease and increases the risk of stroke. This is more likely to develop in people who have high blood pressure readings, are of African descent, or are overweight.  Have your blood pressure checked: ? Every 3-5 years if you are 26-63 years of age. ? Every year if you are 64 years old or older. Diabetes Have regular diabetes screenings. This checks your fasting blood sugar level. Have the screening done:  Once every three years after age 60 if you are at a normal weight and have a low risk for diabetes.  More often and at a younger age if you are overweight or have a high risk for diabetes. What should I know about preventing infection? Hepatitis B If you have a higher risk for hepatitis B, you should be screened for this virus.  Talk with your health care provider to find out if you are at risk for hepatitis B infection. Hepatitis C Testing is recommended for:  Everyone born from [birth year] through 1964.  Anyone with known risk factors for hepatitis C. Sexually transmitted infections (STIs)  Get screened for STIs, including gonorrhea and chlamydia, if: ? You are sexually active and are younger than 58 years of age. ? You are older than 58 years of age and your health care provider tells you that you are at risk for this type of infection. ? Your sexual activity has changed since you were last screened, and you are at increased risk for chlamydia or gonorrhea. Ask your health care provider if you are at risk.  Ask your health care provider about whether you are at high risk for HIV. Your health care provider may  recommend a prescription medicine to help prevent HIV infection. If you choose to take medicine to prevent HIV, you should first get tested for HIV. You should then be tested every 3 months for as long as you are taking the medicine. Pregnancy  If you are about to stop having your period (premenopausal) and you may become pregnant, seek counseling before you get pregnant.  Take 400 to 800 micrograms (mcg) of folic acid every day if you become pregnant.  Ask for birth control (contraception) if you want to prevent pregnancy. Osteoporosis and menopause Osteoporosis is a disease in which the bones lose minerals and strength with aging. This can result in bone fractures. If you are 32 years old or older, or if you are at risk for osteoporosis and fractures, ask your health care provider if you should:  Be screened for bone loss.  Take a calcium or vitamin D supplement to lower your risk of fractures.  Be given hormone replacement therapy (HRT) to treat symptoms of menopause. Follow these instructions at home: Lifestyle  Do not use any products that contain nicotine or tobacco, such as cigarettes, e-cigarettes,  and chewing tobacco. If you need help quitting, ask your health care provider.  Do not use street drugs.  Do not share needles.  Ask your health care provider for help if you need support or information about quitting drugs. Alcohol use  Do not drink alcohol if: ? Your health care provider tells you not to drink. ? You are pregnant, may be pregnant, or are planning to become pregnant.  If you drink alcohol: ? Limit how much you use to 0-1 drink a day. ? Limit intake if you are breastfeeding.  Be aware of how much alcohol is in your drink. In the [place] one drink equals one 12 oz bottle of beer (355 mL), one 5 oz glass of wine (148 mL), or one 1 oz glass of hard liquor (44 mL). General instructions  Schedule regular health, dental, and eye exams.  Stay current with your vaccines.  Tell your health care provider if: ? You often feel depressed. ? You have ever been abused or do not feel safe at home. Summary  Adopting a healthy lifestyle and getting preventive care are important in promoting health and wellness.  Follow your health care provider's instructions about healthy diet, exercising, and getting tested or screened for diseases.  Follow your health care provider's instructions on monitoring your cholesterol and blood pressure. This information is not intended to replace advice given to you by your health care provider. Make sure you discuss any questions you have with your health care provider. Document Revised: 04/30/2018 Document Reviewed: 04/30/2018 Elsevier Patient Education  2020 [employer].

## 2020-05-18 NOTE — Assessment & Plan Note (Signed)
Normal blood pressure readings in the office at home off medication.  No need for amlodipine.  Will discontinue. Much improved diabetes on Trulicity.  Losing weight with better nutrition. Continue statin therapy with atorvastatin 10 mg daily. Follow-up in 3 months.

## 2020-08-15 ENCOUNTER — Ambulatory Visit: Payer: BC Managed Care – PPO | Admitting: Emergency Medicine

## 2020-08-22 ENCOUNTER — Ambulatory Visit: Payer: BC Managed Care – PPO | Admitting: Emergency Medicine

## 2020-08-30 ENCOUNTER — Ambulatory Visit (INDEPENDENT_AMBULATORY_CARE_PROVIDER_SITE_OTHER): Payer: 59 | Admitting: Emergency Medicine

## 2020-08-30 ENCOUNTER — Encounter: Payer: Self-pay | Admitting: Emergency Medicine

## 2020-08-30 ENCOUNTER — Other Ambulatory Visit: Payer: Self-pay

## 2020-08-30 VITALS — BP 104/76 | HR 101 | Temp 98.9°F | Ht 63.0 in | Wt 185.0 lb

## 2020-08-30 DIAGNOSIS — E1169 Type 2 diabetes mellitus with other specified complication: Secondary | ICD-10-CM | POA: Diagnosis not present

## 2020-08-30 DIAGNOSIS — E785 Hyperlipidemia, unspecified: Secondary | ICD-10-CM

## 2020-08-30 DIAGNOSIS — R300 Dysuria: Secondary | ICD-10-CM

## 2020-08-30 DIAGNOSIS — N390 Urinary tract infection, site not specified: Secondary | ICD-10-CM | POA: Diagnosis not present

## 2020-08-30 DIAGNOSIS — I152 Hypertension secondary to endocrine disorders: Secondary | ICD-10-CM | POA: Diagnosis not present

## 2020-08-30 DIAGNOSIS — E1159 Type 2 diabetes mellitus with other circulatory complications: Secondary | ICD-10-CM | POA: Diagnosis not present

## 2020-08-30 LAB — POCT URINALYSIS DIP (MANUAL ENTRY)
Blood, UA: NEGATIVE
Glucose, UA: NEGATIVE mg/dL
Ketones, POC UA: NEGATIVE mg/dL
Leukocytes, UA: NEGATIVE
Nitrite, UA: POSITIVE — AB
Spec Grav, UA: >=1.03 — AB (ref 1.010–1.025)
Urobilinogen, UA: 0.2 E.U./dL
pH, UA: 6 (ref 5.0–8.0)

## 2020-08-30 LAB — POCT GLYCOSYLATED HEMOGLOBIN (HGB A1C): Hemoglobin A1C: 7 % — AB (ref 4.0–5.6)

## 2020-08-30 MED ORDER — SULFAMETHOXAZOLE-TRIMETHOPRIM 800-160 MG PO TABS: 1.0000 | ORAL_TABLET | Freq: Two times a day (BID) | ORAL | 0 refills | Status: AC

## 2020-08-30 NOTE — Patient Instructions (Signed)
Diabetes Mellitus and Nutrition, Adult When you have diabetes, or diabetes mellitus, it is very important to have healthy eating habits because your blood sugar (glucose) levels are greatly affected by what you eat and drink. Eating healthy foods in the right amounts, at about the same times every day, can help you:  Control your blood glucose.  Lower your risk of heart disease.  Improve your blood pressure.  Reach or maintain a healthy weight. What can affect my meal plan? Every person with diabetes is different, and each person has different needs for a meal plan. Your health care provider may recommend that you work with a dietitian to make a meal plan that is best for you. Your meal plan may vary depending on factors such as:  The calories you need.  The medicines you take.  Your weight.  Your blood glucose, blood pressure, and cholesterol levels.  Your activity level.  Other health conditions you have, such as heart or kidney disease. How do carbohydrates affect me? Carbohydrates, also called carbs, affect your blood glucose level more than any other type of food. Eating carbs naturally raises the amount of glucose in your blood. Carb counting is a method for keeping track of how many carbs you eat. Counting carbs is important to keep your blood glucose at a healthy level, especially if you use insulin or take certain oral diabetes medicines. It is important to know how many carbs you can safely have in each meal. This is different for every person. Your dietitian can help you calculate how many carbs you should have at each meal and for each snack. How does alcohol affect me? Alcohol can cause a sudden decrease in blood glucose (hypoglycemia), especially if you use insulin or take certain oral diabetes medicines. Hypoglycemia can be a life-threatening condition. Symptoms of hypoglycemia, such as sleepiness, dizziness, and confusion, are similar to symptoms of having too much  alcohol.  Do not drink alcohol if: ? Your health care provider tells you not to drink. ? You are pregnant, may be pregnant, or are planning to become pregnant.  If you drink alcohol: ? Do not drink on an empty stomach. ? Limit how much you use to:  0-1 drink a day for women.  0-2 drinks a day for men. ? Be aware of how much alcohol is in your drink. In the [place] one drink equals one 12 oz bottle of beer (355 mL), one 5 oz glass of wine (148 mL), or one 1 oz glass of hard liquor (44 mL). ? Keep yourself hydrated with water, diet soda, or unsweetened iced tea.  Keep in mind that regular soda, juice, and other mixers may contain a lot of sugar and must be counted as carbs. What are tips for following this plan? Reading food labels  Start by checking the serving size on the "Nutrition Facts" label of packaged foods and drinks. The amount of calories, carbs, fats, and other nutrients listed on the label is based on one serving of the item. Many items contain more than one serving per package.  Check the total grams (g) of carbs in one serving. You can calculate the number of servings of carbs in one serving by dividing the total carbs by 15. For example, if a food has 30 g of total carbs per serving, it would be equal to 2 servings of carbs.  Check the number of grams (g) of saturated fats and trans fats in one serving. Choose foods that have   a low amount or none of these fats.  Check the number of milligrams (mg) of salt (sodium) in one serving. Most people should limit total sodium intake to less than 2,300 mg per day.  Always check the nutrition information of foods labeled as "low-fat" or "nonfat." These foods may be higher in added sugar or refined carbs and should be avoided.  Talk to your dietitian to identify your daily goals for nutrients listed on the label. Shopping  Avoid buying canned, pre-made, or processed foods. These foods tend to be high in fat, sodium, and added  sugar.  Shop around the outside edge of the grocery store. This is where you will most often find fresh fruits and vegetables, bulk grains, fresh meats, and fresh dairy. Cooking  Use low-heat cooking methods, such as baking, instead of high-heat cooking methods like deep frying.  Cook using healthy oils, such as olive, canola, or sunflower oil.  Avoid cooking with butter, cream, or high-fat meats. Meal planning  Eat meals and snacks regularly, preferably at the same times every day. Avoid going long periods of time without eating.  Eat foods that are high in fiber, such as fresh fruits, vegetables, beans, and whole grains. Talk with your dietitian about how many servings of carbs you can eat at each meal.  Eat 4-6 oz (112-168 g) of lean protein each day, such as lean meat, chicken, fish, eggs, or tofu. One ounce (oz) of lean protein is equal to: ? 1 oz (28 g) of meat, chicken, or fish. ? 1 egg. ?  cup (62 g) of tofu.  Eat some foods each day that contain healthy fats, such as avocado, nuts, seeds, and fish.   What foods should I eat? Fruits Berries. Apples. Oranges. Peaches. Apricots. Plums. Grapes. Mango. Papaya. Pomegranate. Kiwi. Cherries. Vegetables Lettuce. Spinach. Leafy greens, including kale, chard, collard greens, and mustard greens. Beets. Cauliflower. Cabbage. Broccoli. Carrots. Green beans. Tomatoes. Peppers. Onions. Cucumbers. Brussels sprouts. Grains Whole grains, such as whole-wheat or whole-grain bread, crackers, tortillas, cereal, and pasta. Unsweetened oatmeal. Quinoa. Brown or wild rice. Meats and other proteins Seafood. Poultry without skin. Lean cuts of poultry and beef. Tofu. Nuts. Seeds. Dairy Low-fat or fat-free dairy products such as milk, yogurt, and cheese. The items listed above may not be a complete list of foods and beverages you can eat. Contact a dietitian for more information. What foods should I avoid? Fruits Fruits canned with  syrup. Vegetables Canned vegetables. Frozen vegetables with butter or cream sauce. Grains Refined white flour and flour products such as bread, pasta, snack foods, and cereals. Avoid all processed foods. Meats and other proteins Fatty cuts of meat. Poultry with skin. Breaded or fried meats. Processed meat. Avoid saturated fats. Dairy Full-fat yogurt, cheese, or milk. Beverages Sweetened drinks, such as soda or iced tea. The items listed above may not be a complete list of foods and beverages you should avoid. Contact a dietitian for more information. Questions to ask a health care provider  Do I need to meet with a diabetes educator?  Do I need to meet with a dietitian?  What number can I call if I have questions?  When are the best times to check my blood glucose? Where to find more information:  American Diabetes Association: diabetes.org  Academy of Nutrition and Dietetics: www.eatright.org  National Institute of Diabetes and Digestive and Kidney Diseases: www.niddk.nih.gov  Association of Diabetes Care and Education Specialists: www.diabeteseducator.org Summary  It is important to have healthy eating   habits because your blood sugar (glucose) levels are greatly affected by what you eat and drink.  A healthy meal plan will help you control your blood glucose and maintain a healthy lifestyle.  Your health care provider may recommend that you work with a dietitian to make a meal plan that is best for you.  Keep in mind that carbohydrates (carbs) and alcohol have immediate effects on your blood glucose levels. It is important to count carbs and to use alcohol carefully. This information is not intended to replace advice given to you by your health care provider. Make sure you discuss any questions you have with your health care provider. Document Revised: 04/14/2019 Document Reviewed: 04/14/2019 Elsevier Patient Education  2021 [employer]  

## 2020-08-30 NOTE — Progress Notes (Signed)
Karen [name] 60 y.o.   Chief Complaint  Patient presents with  . Diabetes    A1c  checked  . Urinary Frequency    Patient denies burning symptoms it started Saturday    HISTORY OF PRESENT ILLNESS: This is a 60 y.o. female with history of diabetes and hypertension here for follow-up. Presently on Trulicity 3 mg weekly. Not on any antihypertensive. Also has history of dyslipidemia, on atorvastatin 10 mg daily. Complaining of lower urinary tract symptoms that started last Saturday.  Denies fever or chills.  Denies nausea or vomiting.  Denies flank pain. No other complaints or medical concerns today. Lab Results  Component Value Date   HGBA1C 7.9 (A) 04/26/2020    HPI   Prior to Admission medications   Medication Sig Start Date End Date Taking? Authorizing Provider  atorvastatin (LIPITOR) 10 MG tablet Take 1 tablet (10 mg total) by mouth daily. 04/26/20  Yes Maximiano Coss, NP  cetirizine (ZYRTEC) 10 MG tablet Take 1 tablet (10 mg total) by mouth daily. 04/26/20  Yes Maximiano Coss, NP  cyclobenzaprine (FLEXERIL) 10 MG tablet Take 1 tablet (10 mg total) by mouth at bedtime. 04/26/20  Yes Maximiano Coss, NP  Dulaglutide 3 MG/0.5ML SOPN Inject 3 mg into the skin once a week. 04/26/20  Yes Maximiano Coss, NP  esomeprazole (NEXIUM) 40 MG capsule Take 1 capsule (40 mg total) by mouth daily. 04/26/20  Yes Maximiano Coss, NP  SUMAtriptan (IMITREX) 50 MG tablet Take 1 tablet (50 mg total) by mouth every 2 (two) hours as needed for migraine (Max 2 doses daily). May repeat in 2 hours if headache persists or recurs. 04/26/20  Yes Maximiano Coss, NP  amLODipine (NORVASC) 5 MG tablet Take 1 tablet by mouth once daily Patient not taking: Reported on 08/30/2020 09/26/19   Jacelyn Pi, Lilia Argue, MD  blood glucose meter kit and supplies Per insurance coverage. Check blood glucose once a day. Dx E11.65 02/15/20   Daleen Squibb, MD    Allergies  Allergen Reactions  . Orange Fruit [Citrus]  Hives  . Penicillins Hives    Patient Active Problem List   Diagnosis Date Noted  . Hyperlipidemia associated with type 2 diabetes mellitus ([place]) 12/30/2018  . Seasonal allergies 04/19/2017  . Gastroesophageal reflux disease 04/19/2017  . Diabetes mellitus ([place]) 02/14/2012  . Hypertension associated with diabetes ([place]) 02/14/2012    Past Medical History:  Diagnosis Date  . Allergy   . Anemia    past hx in her 31's  . Diabetes mellitus without complication ([place])   . GERD (gastroesophageal reflux disease)   . Hyperlipidemia    Controlled   . Hypertension    Controlled     Past Surgical History:  Procedure Laterality Date  . ABDOMINAL HYSTERECTOMY     PID; ovaries resected.  Age 34.  Marland Kitchen APPENDECTOMY      Social History   Socioeconomic History  . Marital status: Divorced    Spouse name: Not on file  . Number of children: Not on file  . Years of education: Not on file  . Highest education level: Not on file  Occupational History  . Not on file  Tobacco Use  . Smoking status: Former Smoker    Packs/day: 0.70    Years: 30.00    Pack years: 21.00    Types: Cigarettes  . Smokeless tobacco: Never Used  Vaping Use  . Vaping Use: Never used  Substance and Sexual Activity  . Alcohol use:  No  . Drug use: No  . Sexual activity: Yes    Birth control/protection: None  Other Topics Concern  . Not on file  Social History Narrative   Marital status: divorced; not dating      Children:  None      Lives: alone      Employment: Radio broadcast assistant       Tobacco: sometimes; smokes 1 pack per week       Alcohol:  None      Drugs: none      Exercise:  sporadic   Social Determinants of Radio broadcast assistant Strain: Not on file  Food Insecurity: Not on file  Transportation Needs: Not on file  Physical Activity: Not on file  Stress: Not on file  Social Connections: Not on file  Intimate Partner Violence: Not on file    Family History  Problem Relation Age of  Onset  . Cancer Mother 12       uterine cancer  . Uterine cancer Mother   . Heart disease Sister 79       AMI/CAD with stenting  . Hypertension Sister   . Breast cancer Maternal Aunt   . Colon cancer Neg Hx   . Colon polyps Neg Hx   . Esophageal cancer Neg Hx   . Stomach cancer Neg Hx   . Rectal cancer Neg Hx      Review of Systems  Constitutional: Negative.  Negative for chills and fever.  HENT: Negative.  Negative for congestion and sore throat.   Respiratory: Negative.  Negative for cough and shortness of breath.   Cardiovascular: Negative.  Negative for chest pain and palpitations.  Gastrointestinal: Negative for abdominal pain, nausea and vomiting.  Genitourinary: Positive for dysuria, frequency and urgency. Negative for flank pain and hematuria.  Skin: Negative.  Negative for rash.  Neurological: Negative.  Negative for dizziness and headaches.  All other systems reviewed and are negative.   Today's Vitals   08/30/20 1355  BP: 104/76  Pulse: (!) 101  Temp: 98.9 F (37.2 C)  TempSrc: Oral  SpO2: 98%  Weight: 185 lb (83.9 kg)  Height: _0  (1.6 m)   Body mass index is 32.77 kg/m. Wt Readings from Last 3 Encounters:  08/30/20 185 lb (83.9 kg)  05/18/20 182 lb (82.6 kg)  04/26/20 183 lb 3.2 oz (83.1 kg)     Physical Exam Vitals reviewed.  Constitutional:      Appearance: Normal appearance.  HENT:     Head: Normocephalic.  Eyes:     Extraocular Movements: Extraocular movements intact.     Conjunctiva/sclera: Conjunctivae normal.     Pupils: Pupils are equal, round, and reactive to light.  Cardiovascular:     Rate and Rhythm: Normal rate and regular rhythm.     Pulses: Normal pulses.     Heart sounds: Normal heart sounds.  Pulmonary:     Effort: Pulmonary effort is normal.     Breath sounds: Normal breath sounds.  Musculoskeletal:        General: Normal range of motion.     Cervical back: Normal range of motion and neck supple.  Skin:    General:  Skin is warm and dry.     Capillary Refill: Capillary refill takes less than 2 seconds.  Neurological:     General: No focal deficit present.     Mental Status: She is alert and oriented to person, place, and time.  Psychiatric:  Mood and Affect: Mood normal.        Behavior: Behavior normal.    Results for orders placed or performed in visit on 08/30/20 (from the past 24 hour(s))  POCT glycosylated hemoglobin (Hb A1C)     Status: Abnormal   Collection Time: 08/30/20  2:31 PM  Result Value Ref Range   Hemoglobin A1C 7.0 (A) 4.0 - 5.6 %   HbA1c POC (<> result, manual entry)     HbA1c, POC (prediabetic range)     HbA1c, POC (controlled diabetic range)    POCT urinalysis dipstick     Status: Abnormal   Collection Time: 08/30/20  2:32 PM  Result Value Ref Range   Color, UA yellow yellow   Clarity, UA clear clear   Glucose, UA negative negative mg/dL   Bilirubin, UA small (A) negative   Ketones, POC UA negative negative mg/dL   Spec Grav, UA >=1.030 (A) 1.010 - 1.025   Blood, UA negative negative   pH, UA 6.0 5.0 - 8.0   Protein Ur, POC trace (A) negative mg/dL   Urobilinogen, UA 0.2 0.2 or 1.0 E.U./dL   Nitrite, UA Positive (A) Negative   Leukocytes, UA Negative Negative     ASSESSMENT & PLAN: Hypertension associated with diabetes ([place]) Normotensive off medications. Much improved hemoglobin A1c at 7.0.  Continue Trulicity 3 mg weekly. Diet and nutrition discussed. Continue Lipitor 10 mg daily. Follow-up in 6 months. Charnee was seen today for diabetes and urinary frequency.  Diagnoses and all orders for this visit:  Hypertension associated with diabetes ([place]) -     POCT glucose (manual entry) -     POCT glycosylated hemoglobin (Hb A1C)  Hyperlipidemia associated with type 2 diabetes mellitus (HCC)  Dysuria -     POCT urinalysis dipstick -     Urine Culture  Acute UTI -     sulfamethoxazole-trimethoprim (BACTRIM DS) 800-160 MG tablet; Take 1 tablet by mouth 2  (two) times daily for 7 days.    Patient Instructions   Diabetes Mellitus and Nutrition, Adult When you have diabetes, or diabetes mellitus, it is very important to have healthy eating habits because your blood sugar (glucose) levels are greatly affected by what you eat and drink. Eating healthy foods in the right amounts, at about the same times every day, can help you:  Control your blood glucose.  Lower your risk of heart disease.  Improve your blood pressure.  Reach or maintain a healthy weight. What can affect my meal plan? Every person with diabetes is different, and each person has different needs for a meal plan. Your health care provider may recommend that you work with a dietitian to make a meal plan that is best for you. Your meal plan may vary depending on factors such as:  The calories you need.  The medicines you take.  Your weight.  Your blood glucose, blood pressure, and cholesterol levels.  Your activity level.  Other health conditions you have, such as heart or kidney disease. How do carbohydrates affect me? Carbohydrates, also called carbs, affect your blood glucose level more than any other type of food. Eating carbs naturally raises the amount of glucose in your blood. Carb counting is a method for keeping track of how many carbs you eat. Counting carbs is important to keep your blood glucose at a healthy level, especially if you use insulin or take certain oral diabetes medicines. It is important to know how many carbs you can safely have  in each meal. This is different for every person. Your dietitian can help you calculate how many carbs you should have at each meal and for each snack. How does alcohol affect me? Alcohol can cause a sudden decrease in blood glucose (hypoglycemia), especially if you use insulin or take certain oral diabetes medicines. Hypoglycemia can be a life-threatening condition. Symptoms of hypoglycemia, such as sleepiness, dizziness, and  confusion, are similar to symptoms of having too much alcohol.  Do not drink alcohol if: ? Your health care provider tells you not to drink. ? You are pregnant, may be pregnant, or are planning to become pregnant.  If you drink alcohol: ? Do not drink on an empty stomach. ? Limit how much you use to:  0-1 drink a day for women.  0-2 drinks a day for men. ? Be aware of how much alcohol is in your drink. In the [place] one drink equals one 12 oz bottle of beer (355 mL), one 5 oz glass of wine (148 mL), or one 1 oz glass of hard liquor (44 mL). ? Keep yourself hydrated with water, diet soda, or unsweetened iced tea.  Keep in mind that regular soda, juice, and other mixers may contain a lot of sugar and must be counted as carbs. What are tips for following this plan? Reading food labels  Start by checking the serving size on the "Nutrition Facts" label of packaged foods and drinks. The amount of calories, carbs, fats, and other nutrients listed on the label is based on one serving of the item. Many items contain more than one serving per package.  Check the total grams (g) of carbs in one serving. You can calculate the number of servings of carbs in one serving by dividing the total carbs by 15. For example, if a food has 30 g of total carbs per serving, it would be equal to 2 servings of carbs.  Check the number of grams (g) of saturated fats and trans fats in one serving. Choose foods that have a low amount or none of these fats.  Check the number of milligrams (mg) of salt (sodium) in one serving. Most people should limit total sodium intake to less than 2,300 mg per day.  Always check the nutrition information of foods labeled as "low-fat" or "nonfat." These foods may be higher in added sugar or refined carbs and should be avoided.  Talk to your dietitian to identify your daily goals for nutrients listed on the label. Shopping  Avoid buying canned, pre-made, or processed foods. These  foods tend to be high in fat, sodium, and added sugar.  Shop around the outside edge of the grocery store. This is where you will most often find fresh fruits and vegetables, bulk grains, fresh meats, and fresh dairy. Cooking  Use low-heat cooking methods, such as baking, instead of high-heat cooking methods like deep frying.  Cook using healthy oils, such as olive, canola, or sunflower oil.  Avoid cooking with butter, cream, or high-fat meats. Meal planning  Eat meals and snacks regularly, preferably at the same times every day. Avoid going long periods of time without eating.  Eat foods that are high in fiber, such as fresh fruits, vegetables, beans, and whole grains. Talk with your dietitian about how many servings of carbs you can eat at each meal.  Eat 4-6 oz (112-168 g) of lean protein each day, such as lean meat, chicken, fish, eggs, or tofu. One ounce (oz) of lean protein is  equal to: ? 1 oz (28 g) of meat, chicken, or fish. ? 1 egg. ?  cup (62 g) of tofu.  Eat some foods each day that contain healthy fats, such as avocado, nuts, seeds, and fish.   What foods should I eat? Fruits Berries. Apples. Oranges. Peaches. Apricots. Plums. Grapes. Mango. Papaya. Pomegranate. Kiwi. Cherries. Vegetables Lettuce. Spinach. Leafy greens, including kale, chard, collard greens, and mustard greens. Beets. Cauliflower. Cabbage. Broccoli. Carrots. Green beans. Tomatoes. Peppers. Onions. Cucumbers. Brussels sprouts. Grains Whole grains, such as whole-wheat or whole-grain bread, crackers, tortillas, cereal, and pasta. Unsweetened oatmeal. Quinoa. Brown or wild rice. Meats and other proteins Seafood. Poultry without skin. Lean cuts of poultry and beef. Tofu. Nuts. Seeds. Dairy Low-fat or fat-free dairy products such as milk, yogurt, and cheese. The items listed above may not be a complete list of foods and beverages you can eat. Contact a dietitian for more information. What foods should I  avoid? Fruits Fruits canned with syrup. Vegetables Canned vegetables. Frozen vegetables with butter or cream sauce. Grains Refined white flour and flour products such as bread, pasta, snack foods, and cereals. Avoid all processed foods. Meats and other proteins Fatty cuts of meat. Poultry with skin. Breaded or fried meats. Processed meat. Avoid saturated fats. Dairy Full-fat yogurt, cheese, or milk. Beverages Sweetened drinks, such as soda or iced tea. The items listed above may not be a complete list of foods and beverages you should avoid. Contact a dietitian for more information. Questions to ask a health care provider  Do I need to meet with a diabetes educator?  Do I need to meet with a dietitian?  What number can I call if I have questions?  When are the best times to check my blood glucose? Where to find more information:  American Diabetes Association: diabetes.org  Academy of Nutrition and Dietetics: www.eatright.[employer] of Diabetes and Digestive and Kidney Diseases: [employer]  Association of Diabetes Care and Education Specialists: www.diabeteseducator.org Summary  It is important to have healthy eating habits because your blood sugar (glucose) levels are greatly affected by what you eat and drink.  A healthy meal plan will help you control your blood glucose and maintain a healthy lifestyle.  Your health care provider may recommend that you work with a dietitian to make a meal plan that is best for you.  Keep in mind that carbohydrates (carbs) and alcohol have immediate effects on your blood glucose levels. It is important to count carbs and to use alcohol carefully. This information is not intended to replace advice given to you by your health care provider. Make sure you discuss any questions you have with your health care provider. Document Revised: 04/14/2019 Document Reviewed: 04/14/2019 Elsevier Patient Education  2021 [place], MD [facility]

## 2020-08-30 NOTE — Assessment & Plan Note (Signed)
Normotensive off medications. Much improved hemoglobin A1c at 7.0.  Continue Trulicity 3 mg weekly. Diet and nutrition discussed. Continue Lipitor 10 mg daily. Follow-up in 6 months.

## 2020-08-31 LAB — URINE CULTURE

## 2020-09-05 ENCOUNTER — Other Ambulatory Visit: Payer: Self-pay | Admitting: Emergency Medicine

## 2020-09-05 DIAGNOSIS — I152 Hypertension secondary to endocrine disorders: Secondary | ICD-10-CM

## 2020-09-05 DIAGNOSIS — E1159 Type 2 diabetes mellitus with other circulatory complications: Secondary | ICD-10-CM

## 2020-09-05 MED ORDER — BLOOD GLUCOSE MONITOR KIT: PACK | 0 refills | Status: AC

## 2020-09-19 ENCOUNTER — Telehealth: Payer: Self-pay | Admitting: Emergency Medicine

## 2020-09-19 ENCOUNTER — Other Ambulatory Visit: Payer: Self-pay | Admitting: Emergency Medicine

## 2020-09-19 DIAGNOSIS — M25512 Pain in left shoulder: Secondary | ICD-10-CM

## 2020-09-19 NOTE — Telephone Encounter (Signed)
cyclobenzaprine (FLEXERIL) 10 MG tablet [place], [place]. Phone:  [phone]  Fax:  [phone]     Last seen- 04.12.22

## 2020-09-19 NOTE — Telephone Encounter (Signed)
    Patient calling to report low blood sugar Dexcom reading 40   Call transferred to  [facility] w/[facility]

## 2020-09-20 NOTE — Telephone Encounter (Signed)
[facility] FYI   Caller states that she had a BG of less than 40 mg/ dL via her Dexcom at approx 4:35 pm. Reports that it is 46 mg/dL now. Reports having a headache which she rates as 7/10. Reports that she took Tylenol 500 mg PO at 1:30 pm. Reports that she eats peanut butter when the sugar is low and she has done that. Denies nausea. Does not have a backup glucometer to check it manually. No fever, vomiting or diarrhea. Normal PO fluid intake and urination.-Caller states that she had a BG of less than 40 mg/ dL via her Dexcom at approx 4:35 pm. Reports that it is 46 mg/dL now. Reports having a headache which she rates as 7/10. Reports that she took Tylenol 500 mg PO at 1:30 pm. Reports that she eats peanut butter when the sugar is low and she has done that. Denies nausea. Does not have a backup glucometer to check it manually. No fever, vomiting or diarrhea. Normal PO fluid intake and urination.  Advised to go ED now. Also advised Urgent Home Treatment with Follow-Up Call. Patient understood and agreed.

## 2020-09-21 MED ORDER — CYCLOBENZAPRINE HCL 10 MG PO TABS: 10.0000 mg | ORAL_TABLET | Freq: Every day | ORAL | 2 refills | Status: DC

## 2020-09-21 NOTE — Telephone Encounter (Signed)
Patient is requesting a refill of the following medications: Requested Prescriptions   Pending Prescriptions Disp Refills  . cyclobenzaprine (FLEXERIL) 10 MG tablet 30 tablet 2    Sig: Take 1 tablet (10 mg total) by mouth at bedtime.    Date of patient request:09/19/2020 Last office visit: 04/28/2020 Date of last refill: 04/26/2020 Last refill amount: 30 tablets 2 refills. Follow up time period per chart: 03/01/2021 with dr Mitchel Honour

## 2020-09-29 ENCOUNTER — Telehealth: Payer: Self-pay | Admitting: Emergency Medicine

## 2020-09-29 NOTE — Telephone Encounter (Signed)
    Patient requesting Dexcom G6 sensor order  [name], Karen.

## 2020-09-30 NOTE — Telephone Encounter (Signed)
Called and spoke with pt, the dexcom g6 sensor was refilled 09/29/20. Pt states that she has not received notification from [place]. Pt states she will call pharmacy for an update.

## 2020-10-16 ENCOUNTER — Other Ambulatory Visit: Payer: Self-pay | Admitting: Registered Nurse

## 2020-10-16 DIAGNOSIS — E119 Type 2 diabetes mellitus without complications: Secondary | ICD-10-CM

## 2020-10-25 ENCOUNTER — Ambulatory Visit (INDEPENDENT_AMBULATORY_CARE_PROVIDER_SITE_OTHER): Payer: 59 | Admitting: Orthopaedic Surgery

## 2020-10-25 ENCOUNTER — Other Ambulatory Visit: Payer: Self-pay

## 2020-10-25 ENCOUNTER — Encounter: Payer: Self-pay | Admitting: Orthopaedic Surgery

## 2020-10-25 DIAGNOSIS — M7061 Trochanteric bursitis, right hip: Secondary | ICD-10-CM | POA: Diagnosis not present

## 2020-10-25 MED ORDER — LIDOCAINE HCL 1 % IJ SOLN
2.0000 mL | INTRAMUSCULAR | Status: AC | PRN
  Administered 2020-10-25: 2 mL

## 2020-10-25 MED ORDER — BUPIVACAINE HCL 0.25 % IJ SOLN
2.0000 mL | INTRAMUSCULAR | Status: AC | PRN
  Administered 2020-10-25: 2 mL via INTRA_ARTICULAR

## 2020-10-25 NOTE — Progress Notes (Signed)
Office Visit Note   Patient: Karen [name]           Date of Birth: Dec 16, 1960           MRN: [id number] Visit Date: 10/25/2020              Requested by: Karen Pollen, MD [place],  [place] [address] PCP: Karen Pollen, MD   Assessment & Plan: Visit Diagnoses:  1. Trochanteric bursitis, right hip     Plan: Recurrent symptoms of greater trochanteric bursitis right hip.  Will reinject with cortisone.  Has done well in the past  Follow-Up Instructions: Return if symptoms worsen or fail to improve.   Orders:  No orders of the defined types were placed in this encounter.  No orders of the defined types were placed in this encounter.     Procedures: Large Joint Inj: R greater trochanter on 10/25/2020 3:33 PM Indications: pain and diagnostic evaluation Details: 25 G 1.5 in needle  Arthrogram: No  Medications: 2 mL lidocaine 1 %; 2 mL bupivacaine 0.25 %  12 mg betamethasone injected into greater trochanter right hip with Xylocaine and Marcaine Procedure, treatment alternatives, risks and benefits explained, specific risks discussed. Consent was given by the patient. Immediately prior to procedure a time out was called to verify the correct patient, procedure, equipment, support staff and site/side marked as required. Patient was prepped and draped in the usual sterile fashion.       Clinical Data: No additional findings.   Subjective: Chief Complaint  Patient presents with  . Right Hip - Pain   Karen [name] is experiencing recurrent pain directly over the greater trochanter of her right hip.  She had a cortisone injection in November 2021 with relief of her pain.  She has had some recurrence over the past several weeks.  No groin pain.  No numbness or tingling or referred pain distally HPI  Review of Systems   Objective: Vital Signs: There were no vitals taken for this visit.  Physical Exam Constitutional:      Appearance: She is  well-developed.  Eyes:     Pupils: Pupils are equal, round, and reactive to light.  Pulmonary:     Effort: Pulmonary effort is normal.  Skin:    General: Skin is warm and dry.  Neurological:     Mental Status: She is alert and oriented to person, place, and time.  Psychiatric:        Behavior: Behavior normal.     Ortho Exam awake alert and oriented x3.  Comfortable sitting.  Direct tenderness over the greater trochanter of the right hip.  Skin intact.  No erythema or ecchymosis.  Painless range of motion with internal and external rotation  Specialty Comments:  No specialty comments available.  Imaging: No results found.   PMFS History: Patient Active Problem List   Diagnosis Date Noted  . Hyperlipidemia associated with type 2 diabetes mellitus ([place]) 12/30/2018  . Seasonal allergies 04/19/2017  . Gastroesophageal reflux disease 04/19/2017  . Diabetes mellitus ([place]) 02/14/2012  . Hypertension associated with diabetes ([place]) 02/14/2012   Past Medical History:  Diagnosis Date  . Allergy   . Anemia    past hx in her 55's  . Diabetes mellitus without complication ([place])   . GERD (gastroesophageal reflux disease)   . Hyperlipidemia    Controlled   . Hypertension    Controlled     Family History  Problem Relation Age of Onset  .  Cancer Mother 31       uterine cancer  . Uterine cancer Mother   . Heart disease Sister 60       AMI/CAD with stenting  . Hypertension Sister   . Breast cancer Maternal Aunt   . Colon cancer Neg Hx   . Colon polyps Neg Hx   . Esophageal cancer Neg Hx   . Stomach cancer Neg Hx   . Rectal cancer Neg Hx     Past Surgical History:  Procedure Laterality Date  . ABDOMINAL HYSTERECTOMY     PID; ovaries resected.  Age 20.  Marland Kitchen APPENDECTOMY     Social History   Occupational History  . Not on file  Tobacco Use  . Smoking status: Former Smoker    Packs/day: 0.70    Years: 30.00    Pack years: 21.00    Types: Cigarettes  . Smokeless  tobacco: Never Used  Vaping Use  . Vaping Use: Never used  Substance and Sexual Activity  . Alcohol use: No  . Drug use: No  . Sexual activity: Yes    Birth control/protection: None     Karen Balding, MD   Note - This record has been created using [employer].  Chart creation errors have been sought, but may not always  have been located. Such creation errors do not reflect on  the standard of medical care.

## 2020-10-31 ENCOUNTER — Ambulatory Visit (HOSPITAL_COMMUNITY)
Admission: EM | Admit: 2020-10-31 | Discharge: 2020-10-31 | Disposition: A | Discharge status: Home / Self Care | Payer: 59 | Attending: Physician Assistant | Admitting: Physician Assistant

## 2020-10-31 ENCOUNTER — Other Ambulatory Visit: Payer: Self-pay

## 2020-10-31 ENCOUNTER — Encounter (HOSPITAL_COMMUNITY): Payer: Self-pay

## 2020-10-31 DIAGNOSIS — R350 Frequency of micturition: Secondary | ICD-10-CM | POA: Insufficient documentation

## 2020-10-31 DIAGNOSIS — R300 Dysuria: Secondary | ICD-10-CM | POA: Insufficient documentation

## 2020-10-31 DIAGNOSIS — N3000 Acute cystitis without hematuria: Secondary | ICD-10-CM | POA: Insufficient documentation

## 2020-10-31 LAB — POCT URINALYSIS DIPSTICK, ED / UC
Bilirubin Urine: NEGATIVE
Glucose, UA: NEGATIVE mg/dL
Hgb urine dipstick: NEGATIVE
Ketones, ur: NEGATIVE mg/dL
Leukocytes,Ua: NEGATIVE
Nitrite: POSITIVE — AB
Protein, ur: NEGATIVE mg/dL
Specific Gravity, Urine: 1.025 (ref 1.005–1.030)
Urobilinogen, UA: 0.2 mg/dL (ref 0.0–1.0)
pH: 5.5 (ref 5.0–8.0)

## 2020-10-31 MED ORDER — SULFAMETHOXAZOLE-TRIMETHOPRIM 800-160 MG PO TABS: 1.0000 | ORAL_TABLET | Freq: Two times a day (BID) | ORAL | 0 refills | Status: AC

## 2020-10-31 NOTE — Discharge Instructions (Addendum)
Take Bactrim twice daily for 7 days to cover for urinary tract infection.  If we need to adjust your antibiotics based on your culture results we will contact you.  If you do not hear from [place] please continue the medication as prescribed.  Make sure you are drinking plenty of water.  If you have any worsening symptoms please return for reevaluation.

## 2020-10-31 NOTE — ED Provider Notes (Signed)
[place]    CSN: [id number] Arrival date & time: 10/31/20  0800      History   Chief Complaint Chief Complaint  Patient presents with   Bladder Pressure   Back Pain   Urinary Frequency    HPI Karen [name] is a 60 y.o. female.   Patient presents today with a 2 to 3-day history of UTI symptoms.  Reports urinary frequency, lower abdominal pain, urinary urgency.  Denies any hematuria, vaginal symptoms, fever, nausea, vomiting.  She does have a history of UTI and states current symptoms are similar to previous episodes of this condition.  Denies any recent antibiotic use; was last treated for UTI approximately 3 months ago with Bactrim and reports complete resolution of symptoms with this antibiotic.  She has not seen a urologist in the past.  Denies history of nephrolithiasis, recent urogenital procedure, self-catheterization, single kidney.  She has not tried any over-the-counter medications for symptom management but has been pushing fluids without improvement.  She reports significant back pain associated with UTI that is interfering with her ability perform daily activities; denies any bowel/bladder continence, lower extremity weakness, saddle anesthesia.   Past Medical History:  Diagnosis Date   Allergy    Anemia    past hx in her 20's   Diabetes mellitus without complication (HCC)    GERD (gastroesophageal reflux disease)    Hyperlipidemia    Controlled    Hypertension    Controlled     Patient Active Problem List   Diagnosis Date Noted   Hyperlipidemia associated with type 2 diabetes mellitus ([place]) 12/30/2018   Seasonal allergies 04/19/2017   Gastroesophageal reflux disease 04/19/2017   Diabetes mellitus ([place]) 02/14/2012   Hypertension associated with diabetes ([place]) 02/14/2012    Past Surgical History:  Procedure Laterality Date   ABDOMINAL HYSTERECTOMY     PID; ovaries resected.  Age 13.   APPENDECTOMY      OB History   No obstetric history  on file.      Home Medications    Prior to Admission medications   Medication Sig Start Date End Date Taking? Authorizing Provider  atorvastatin (LIPITOR) 10 MG tablet Take 1 tablet (10 mg total) by mouth daily. 04/26/20  Yes Maximiano Coss, NP  cetirizine (ZYRTEC) 10 MG tablet Take 1 tablet (10 mg total) by mouth daily. 04/26/20  Yes Maximiano Coss, NP  cyclobenzaprine (FLEXERIL) 10 MG tablet Take 1 tablet (10 mg total) by mouth at bedtime. 09/21/20  Yes Maximiano Coss, NP  esomeprazole (NEXIUM) 40 MG capsule Take 1 capsule (40 mg total) by mouth daily. 04/26/20  Yes Maximiano Coss, NP  sulfamethoxazole-trimethoprim (BACTRIM DS) 800-160 MG tablet Take 1 tablet by mouth 2 (two) times daily for 7 days. 10/31/20 11/07/20 Yes Glendola Friedhoff, Derry Skill, PA-C  SUMAtriptan (IMITREX) 50 MG tablet Take 1 tablet (50 mg total) by mouth every 2 (two) hours as needed for migraine (Max 2 doses daily). May repeat in 2 hours if headache persists or recurs. 04/26/20  Yes Maximiano Coss, NP  TRULICITY 3 W[place]/6.3ZC SOPN INJECT 3 MG INTO THE SKIN ONCE A WEEK 10/18/20  Yes Maximiano Coss, NP  amLODipine (NORVASC) 5 MG tablet Take 1 tablet by mouth once daily Patient not taking: Reported on 08/30/2020 09/26/19   Jacelyn Pi, Lilia Argue, MD  blood glucose meter kit and supplies KIT Dispense based on patient and insurance preference. Use up to four times daily as directed. 09/05/20   Horald Pollen, MD  blood  glucose meter kit and supplies Per insurance coverage. Check blood glucose once a day. Dx E11.65 02/15/20   Daleen Squibb, MD  Continuous Blood Gluc Sensor (DEXCOM G6 SENSOR) MISC USE AS DIRECTED 4 TIMES DAILY 09/29/20   Horald Pollen, MD    Family History Family History  Problem Relation Age of Onset   Cancer Mother 80       uterine cancer   Uterine cancer Mother    Heart disease Sister 53       AMI/CAD with stenting   Hypertension Sister    Breast cancer Maternal Aunt    Colon cancer Neg Hx    Colon  polyps Neg Hx    Esophageal cancer Neg Hx    Stomach cancer Neg Hx    Rectal cancer Neg Hx     Social History Social History   Tobacco Use   Smoking status: Former    Packs/day: 0.70    Years: 30.00    Pack years: 21.00    Types: Cigarettes   Smokeless tobacco: Never  Vaping Use   Vaping Use: Never used  Substance Use Topics   Alcohol use: No   Drug use: No     Allergies   Orange fruit [citrus] and Penicillins   Review of Systems Review of Systems  Constitutional:  Negative for activity change, appetite change, fatigue and fever.  Respiratory:  Negative for shortness of breath.   Cardiovascular:  Negative for chest pain.  Gastrointestinal:  Negative for abdominal pain, diarrhea, nausea and vomiting.  Genitourinary:  Positive for frequency and urgency. Negative for dysuria, genital sores, hematuria, pelvic pain, vaginal bleeding, vaginal discharge and vaginal pain.  Musculoskeletal:  Positive for back pain. Negative for arthralgias and myalgias.  Neurological:  Positive for dizziness. Negative for light-headedness and headaches.    Physical Exam Triage Vital Signs ED Triage Vitals  Enc Vitals Group     BP 10/31/20 0817 114/72     Pulse Rate 10/31/20 0817 94     Resp 10/31/20 0817 18     Temp 10/31/20 0817 99.3 F (37.4 C)     Temp src --      SpO2 10/31/20 0817 100 %     Weight --      Height --      Head Circumference --      Peak Flow --      Pain Score 10/31/20 0814 7     Pain Loc --      Pain Edu? --      Excl. in [place]? --    No data found.  Updated Vital Signs BP 114/72   Pulse 94   Temp 99.3 F (37.4 C)   Resp 18   SpO2 100%   Visual Acuity Right Eye Distance:   Left Eye Distance:   Bilateral Distance:    Right Eye Near:   Left Eye Near:    Bilateral Near:     Physical Exam Vitals reviewed.  Constitutional:      General: She is awake. She is not in acute distress.    Appearance: Normal appearance. She is not ill-appearing.      Comments: Very pleasant female appears stated age in no acute distress  HENT:     Head: Normocephalic and atraumatic.  Cardiovascular:     Rate and Rhythm: Normal rate and regular rhythm.     Heart sounds: Normal heart sounds, S1 normal and S2 normal. No murmur heard. Pulmonary:  Effort: Pulmonary effort is normal.     Breath sounds: Normal breath sounds. No wheezing, rhonchi or rales.     Comments: Clear to auscultation bilaterally Abdominal:     General: Bowel sounds are normal.     Palpations: Abdomen is soft.     Tenderness: There is no abdominal tenderness. There is no right CVA tenderness, left CVA tenderness, guarding or rebound.     Comments: Benign abdominal exam.  No CVA tenderness.  Musculoskeletal:     Cervical back: Normal range of motion and neck supple.  Psychiatric:        Behavior: Behavior is cooperative.     UC Treatments / Results  Labs (all labs ordered are listed, but only abnormal results are displayed) Labs Reviewed  POCT URINALYSIS DIPSTICK, ED / UC - Abnormal; Notable for the following components:      Result Value   Nitrite POSITIVE (*)    All other components within normal limits  URINE CULTURE    EKG   Radiology No results found.  Procedures Procedures (including critical care time)  Medications Ordered in UC Medications - No data to display  Initial Impression / Assessment and Plan / UC Course  I have reviewed the triage vital signs and the nursing notes.  Pertinent labs & imaging results that were available during my care of the patient were reviewed by me and considered in my medical decision making (see chart for details).      UA positive for nitrate.  Patient treated with Bactrim twice daily for 7 days given allergy to penicillins.  She was encouraged to drink plenty of fluid.  Urine culture was obtained-results pending.  Discussed potential need to change antibiotics based on susceptibilities identified on culture.  Discussed  alarm symptoms that warrant emergent evaluation.  Strict return precautions given to which patient expressed understanding.  Final Clinical Impressions(s) / UC Diagnoses   Final diagnoses:  Dysuria  Urinary frequency  Acute cystitis without hematuria     Discharge Instructions      Take Bactrim twice daily for 7 days to cover for urinary tract infection.  If we need to adjust your antibiotics based on your culture results we will contact you.  If you do not hear from [place] please continue the medication as prescribed.  Make sure you are drinking plenty of water.  If you have any worsening symptoms please return for reevaluation.     ED Prescriptions     Medication Sig Dispense Auth. Provider   sulfamethoxazole-trimethoprim (BACTRIM DS) 800-160 MG tablet Take 1 tablet by mouth 2 (two) times daily for 7 days. 14 tablet Beyza Bellino, Derry Skill, PA-C      PDMP not reviewed this encounter.   Terrilee Croak, PA-C 10/31/20 [id number]

## 2020-10-31 NOTE — ED Triage Notes (Addendum)
Pt with bladder pressure, lower back pain, urinary frequency for 3 days. Pt states approx 2-3 months ago was told she had a UTI but feels it came back.

## 2020-11-02 LAB — URINE CULTURE: Culture: >=100000 — AB

## 2020-11-03 ENCOUNTER — Telehealth (HOSPITAL_COMMUNITY): Payer: Self-pay | Admitting: Emergency Medicine

## 2020-11-03 MED ORDER — NITROFURANTOIN MONOHYD MACRO 100 MG PO CAPS: 100.0000 mg | ORAL_CAPSULE | Freq: Two times a day (BID) | ORAL | 0 refills | Status: DC

## 2020-11-05 ENCOUNTER — Other Ambulatory Visit: Payer: Self-pay | Admitting: Emergency Medicine

## 2020-11-15 ENCOUNTER — Telehealth: Payer: Self-pay | Admitting: Emergency Medicine

## 2020-11-15 NOTE — Telephone Encounter (Signed)
1.Medication Requested: Continuous Blood Gluc Sensor (DEXCOM G6 SENSOR) MISC   2. Pharmacy (Name, Street, [facility]): [facility]  3. On Med List: yes   4. Last Visit with PCP: 08-30-20  5. Next visit date with PCP: 03-01-21   Agent: Please be advised that RX refills may take up to 3 business days. We ask that you follow-up with your pharmacy.

## 2020-11-18 MED ORDER — DEXCOM G6 SENSOR MISC: 0 refills | Status: DC

## 2020-11-18 NOTE — Telephone Encounter (Signed)
Pt notified that sensor requested has been sent to pharmacy requested; verb understanding.  Instructed to call back if needed.

## 2020-12-03 ENCOUNTER — Other Ambulatory Visit: Payer: Self-pay

## 2020-12-03 ENCOUNTER — Ambulatory Visit (HOSPITAL_COMMUNITY)
Admission: EM | Admit: 2020-12-03 | Discharge: 2020-12-03 | Disposition: A | Discharge status: Home / Self Care | Payer: 59 | Attending: Internal Medicine | Admitting: Internal Medicine

## 2020-12-03 ENCOUNTER — Encounter (HOSPITAL_COMMUNITY): Payer: Self-pay | Admitting: *Deleted

## 2020-12-03 DIAGNOSIS — M546 Pain in thoracic spine: Secondary | ICD-10-CM

## 2020-12-03 DIAGNOSIS — T148XXA Other injury of unspecified body region, initial encounter: Secondary | ICD-10-CM

## 2020-12-03 DIAGNOSIS — M542 Cervicalgia: Secondary | ICD-10-CM

## 2020-12-03 MED ORDER — TIZANIDINE HCL 4 MG PO CAPS: 4.0000 mg | ORAL_CAPSULE | Freq: Three times a day (TID) | ORAL | 0 refills | Status: DC | PRN

## 2020-12-03 MED ORDER — KETOROLAC TROMETHAMINE 30 MG/ML IJ SOLN
INTRAMUSCULAR | Status: AC
  Filled 2020-12-03: qty 1

## 2020-12-03 MED ORDER — KETOROLAC TROMETHAMINE 30 MG/ML IJ SOLN
30.0000 mg | Freq: Once | INTRAMUSCULAR | Status: AC
  Administered 2020-12-03: 30 mg via INTRAMUSCULAR

## 2020-12-03 NOTE — ED Provider Notes (Signed)
[facility]    CSN: [id number] Arrival date & time: 12/03/20  1020      History   Chief Complaint Chief Complaint  Patient presents with   Neck Pain   Chest Pain    HPI Karen [name] is a 60 y.o. female.   Patient presents today with a 2-day history of neck and back pain.  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  She has had pain since that time.  She has Flexeril available and took this last night but did not provide any relief of symptoms.  Reports pain is rated 7.5 on a 0-10 pain scale, localized to midline thoracic back with radiation to right thoracic and cervical paraspinal muscles, described as pulling/tightness, no aggravating or alleviating factors identified.  She denies any chest pain, shortness of breath, nausea, vomiting, weakness, numbness, tingling sensations.  She is having difficulty with daily activities as result of symptoms.  She is unable to take NSAIDs due to GERD but denies history of chronic kidney disease.  She has been taking Tylenol without improvement of symptoms.  She is having difficulty with daily activities including work duties due to symptoms.   Past Medical History:  Diagnosis Date   Allergy    Anemia    past hx in her 20's   Diabetes mellitus without complication (HCC)    GERD (gastroesophageal reflux disease)    Hyperlipidemia    Controlled    Hypertension    Controlled     Patient Active Problem List   Diagnosis Date Noted   Hyperlipidemia associated with type 2 diabetes mellitus ([place]) 12/30/2018   Seasonal allergies 04/19/2017   Gastroesophageal reflux disease 04/19/2017   Diabetes mellitus ([place]) 02/14/2012   Hypertension associated with diabetes ([place]) 02/14/2012    Past Surgical History:  Procedure Laterality Date   ABDOMINAL HYSTERECTOMY     PID; ovaries resected.  Age 66.   APPENDECTOMY      OB History   No obstetric history on file.      Home Medications    Prior  to Admission medications   Medication Sig Start Date End Date Taking? Authorizing Provider  atorvastatin (LIPITOR) 10 MG tablet Take 1 tablet (10 mg total) by mouth daily. 04/26/20  Yes Maximiano Coss, NP  blood glucose meter kit and supplies KIT Dispense based on patient and insurance preference. Use up to four times daily as directed. 09/05/20  Yes Sagardia, Ines Bloomer, MD  blood glucose meter kit and supplies Per insurance coverage. Check blood glucose once a day. Dx E11.65 02/15/20  Yes Jacelyn Pi, Lilia Argue, MD  cetirizine (ZYRTEC) 10 MG tablet Take 1 tablet (10 mg total) by mouth daily. 04/26/20  Yes Maximiano Coss, NP  Continuous Blood Gluc Sensor (DEXCOM G6 SENSOR) MISC USE AS DIRECTED 4 TIMES DAILY 11/18/20  Yes Sagardia, Ines Bloomer, MD  Continuous Blood Gluc Transmit (DEXCOM G6 TRANSMITTER) MISC USE AS DIRECTED 4 TIMES DAILY 11/06/20  Yes Sagardia, Ines Bloomer, MD  tiZANidine (ZANAFLEX) 4 MG capsule Take 1 capsule (4 mg total) by mouth 3 (three) times daily as needed for muscle spasms. 12/03/20  Yes Guiliana Shor, Derry Skill, PA-C  amLODipine (NORVASC) 5 MG tablet Take 1 tablet by mouth once daily Patient not taking: Reported on 08/30/2020 09/26/19   Jacelyn Pi, Lilia Argue, MD  cyclobenzaprine (FLEXERIL) 10 MG tablet Take 1 tablet (10 mg total) by mouth at bedtime. 09/21/20   Maximiano Coss, NP  esomeprazole (  NEXIUM) 40 MG capsule Take 1 capsule (40 mg total) by mouth daily. 04/26/20   Maximiano Coss, NP  SUMAtriptan (IMITREX) 50 MG tablet Take 1 tablet (50 mg total) by mouth every 2 (two) hours as needed for migraine (Max 2 doses daily). May repeat in 2 hours if headache persists or recurs. 04/26/20   Maximiano Coss, NP  TRULICITY 3 HY/8.5OY SOPN INJECT 3 MG INTO THE SKIN ONCE A WEEK 10/18/20   Maximiano Coss, NP    Family History Family History  Problem Relation Age of Onset   Cancer Mother 12       uterine cancer   Uterine cancer Mother    Heart disease Sister 2       AMI/CAD with stenting    Hypertension Sister    Breast cancer Maternal Aunt    Colon cancer Neg Hx    Colon polyps Neg Hx    Esophageal cancer Neg Hx    Stomach cancer Neg Hx    Rectal cancer Neg Hx     Social History Social History   Tobacco Use   Smoking status: Former    Packs/day: 0.70    Years: 30.00    Pack years: 21.00    Types: Cigarettes   Smokeless tobacco: Never  Vaping Use   Vaping Use: Never used  Substance Use Topics   Alcohol use: No   Drug use: No     Allergies   Orange fruit [citrus] and Penicillins   Review of Systems Review of Systems  Constitutional:  Positive for activity change. Negative for appetite change, fatigue and fever.  Respiratory:  Negative for cough and shortness of breath.   Cardiovascular:  Negative for chest pain.  Gastrointestinal:  Negative for abdominal pain, diarrhea, nausea and vomiting.  Musculoskeletal:  Positive for neck pain. Negative for arthralgias and myalgias.  Neurological:  Negative for dizziness, weakness, light-headedness, numbness and headaches.    Physical Exam Triage Vital Signs ED Triage Vitals  Enc Vitals Group     BP 12/03/20 1113 125/72     Pulse Rate 12/03/20 1113 89     Resp 12/03/20 1113 20     Temp 12/03/20 1113 99 F (37.2 C)     Temp src --      SpO2 12/03/20 1113 100 %     Weight --      Height --      Head Circumference --      Peak Flow --      Pain Score 12/03/20 1114 7     Pain Loc --      Pain Edu? --      Excl. in [place]? --    No data found.  Updated Vital Signs BP 125/72   Pulse 89   Temp 98.6 F (37 C) (Oral)   Resp 20   SpO2 100%   Visual Acuity Right Eye Distance:   Left Eye Distance:   Bilateral Distance:    Right Eye Near:   Left Eye Near:    Bilateral Near:     Physical Exam Vitals reviewed.  Constitutional:      General: She is awake. She is not in acute distress.    Appearance: Normal appearance. She is normal weight. She is not ill-appearing.     Comments: Very pleasant female  present age in no acute distress sitting comfortably in exam room  HENT:     Head: Normocephalic and atraumatic.  Cardiovascular:     Rate and Rhythm:  Normal rate and regular rhythm.     Pulses:          Radial pulses are 2+ on the right side and 2+ on the left side.     Heart sounds: Normal heart sounds, S1 normal and S2 normal. No murmur heard. Pulmonary:     Effort: Pulmonary effort is normal.     Breath sounds: Normal breath sounds. No wheezing, rhonchi or rales.     Comments: Clear to auscultation bilaterally Chest:     Chest wall: No deformity, swelling or tenderness.  Abdominal:     Palpations: Abdomen is soft.     Tenderness: There is no abdominal tenderness.  Musculoskeletal:     Cervical back: Spasms and tenderness present. No bony tenderness.     Thoracic back: Spasms and tenderness present. No bony tenderness.     Lumbar back: No tenderness or bony tenderness.     Comments: Back: No pain percussion of vertebrae.  Tenderness palpation and spasms noted right cervical and thoracic spine region.  Full active range of motion at neck and back.  No deformity or step-off noted.  Psychiatric:        Behavior: Behavior is cooperative.     UC Treatments / Results  Labs (all labs ordered are listed, but only abnormal results are displayed) Labs Reviewed - No data to display  EKG   Radiology No results found.  Procedures Procedures (including critical care time)  Medications Ordered in UC Medications  ketorolac (TORADOL) 30 MG/ML injection 30 mg (has no administration in time range)    Initial Impression / Assessment and Plan / UC Course  I have reviewed the triage vital signs and the nursing notes.  Pertinent labs & imaging results that were available during my care of the patient were reviewed by me and considered in my medical decision making (see chart for details).      EKG was obtained by clinical staff in triage showing normal sinus rhythm with ventricular  rate of 81 bpm without ischemic changes and no significant changes from 06/27/2014 tracing.  Patient denies any chest pain or shortness of breath and reports symptoms are consistent with a pulled muscle.  She had significant muscular tenderness on exam we discussed likely musculoskeletal etiology.  Patient is unable to take NSAIDs due to history of GERD so was given injection of Toradol.  She was prescribed Zanaflex to be used up to 3 times a day as needed for muscle pain.  She was directed not to drive or drink alcohol with this with this medication due to risk of sedation.  She can use Tylenol for breakthrough pain.  Recommended heat, rest, massage for symptom relief.  Discussed alarm symptoms that warrant emergent evaluation.  Strict return precautions given to which patient expressed understanding.   Final Clinical Impressions(s) / UC Diagnoses   Final diagnoses:  Acute right-sided thoracic back pain  Neck pain  Muscle strain     Discharge Instructions      Stop Flexeril and start tizanidine up to 3 times a day as needed for muscle pain.  This make you sleepy do not drive or drink alcohol taking it.  You can use Tylenol for breakthrough pain.  Use heat, rest, stretch for additional symptom relief.  If anything worsens please return for further evaluation.     ED Prescriptions     Medication Sig Dispense Auth. Provider   tiZANidine (ZANAFLEX) 4 MG capsule Take 1 capsule (4 mg total) by mouth  3 (three) times daily as needed for muscle spasms. 30 capsule Karen Galbraith K, PA-C      PDMP not reviewed this encounter.   Terrilee Croak, PA-C 12/03/20 1209

## 2020-12-03 NOTE — ED Triage Notes (Signed)
Pt reports last night Posterior neck pain and pain top Lt shoulder blade.. This morning pt reports CP started.

## 2020-12-03 NOTE — Discharge Instructions (Signed)
Stop Flexeril and start tizanidine up to 3 times a day as needed for muscle pain.  This make you sleepy do not drive or drink alcohol taking it.  You can use Tylenol for breakthrough pain.  Use heat, rest, stretch for additional symptom relief.  If anything worsens please return for further evaluation.

## 2020-12-04 ENCOUNTER — Telehealth (HOSPITAL_COMMUNITY): Payer: Self-pay | Admitting: *Deleted

## 2020-12-04 ENCOUNTER — Telehealth (HOSPITAL_COMMUNITY): Payer: Self-pay | Admitting: Physician Assistant

## 2020-12-04 MED ORDER — METHOCARBAMOL 500 MG PO TABS: 500.0000 mg | ORAL_TABLET | Freq: Two times a day (BID) | ORAL | 0 refills | Status: DC

## 2020-12-04 NOTE — Telephone Encounter (Signed)
Patient contacted clinic stating that tizanidine is not covered by insurance.  Sent in methocarbamol in case this is a preferred medication.  Requested that nursing staff call patient back and inform her that tizanidine is $7.50 at [facility] without insurance and if she is unable to afford this methocarbamol is not covered she would need to contact her insurance company to determine preferred medication.

## 2020-12-04 NOTE — Telephone Encounter (Signed)
Karen [name] ,[name] called in RX for Robaxin because insurance did not cover previous med. TC to Pt left message for PT.that new Med was called to pharmacy.

## 2020-12-08 ENCOUNTER — Other Ambulatory Visit: Payer: Self-pay | Admitting: Emergency Medicine

## 2020-12-08 DIAGNOSIS — Z1231 Encounter for screening mammogram for malignant neoplasm of breast: Secondary | ICD-10-CM

## 2020-12-21 ENCOUNTER — Other Ambulatory Visit: Payer: Self-pay | Admitting: Emergency Medicine

## 2021-01-08 ENCOUNTER — Telehealth: Payer: Self-pay | Admitting: Registered Nurse

## 2021-01-08 DIAGNOSIS — E119 Type 2 diabetes mellitus without complications: Secondary | ICD-10-CM

## 2021-01-10 ENCOUNTER — Encounter (HOSPITAL_COMMUNITY): Payer: Self-pay

## 2021-01-10 ENCOUNTER — Other Ambulatory Visit: Payer: Self-pay

## 2021-01-10 ENCOUNTER — Ambulatory Visit (INDEPENDENT_AMBULATORY_CARE_PROVIDER_SITE_OTHER): Payer: 59

## 2021-01-10 ENCOUNTER — Ambulatory Visit (HOSPITAL_COMMUNITY)
Admission: RE | Admit: 2021-01-10 | Discharge: 2021-01-10 | Disposition: A | Discharge status: Home / Self Care | Payer: 59 | Source: Ambulatory Visit | Attending: Student | Admitting: Student

## 2021-01-10 VITALS — BP 133/84 | HR 87 | Temp 97.7°F | Resp 18

## 2021-01-10 DIAGNOSIS — Z8611 Personal history of tuberculosis: Secondary | ICD-10-CM

## 2021-01-10 DIAGNOSIS — Z0189 Encounter for other specified special examinations: Secondary | ICD-10-CM | POA: Diagnosis not present

## 2021-01-10 NOTE — Telephone Encounter (Signed)
    Patient requesting refill for TRULICITY 3 [phone] SOPN

## 2021-01-10 NOTE — Discharge Instructions (Addendum)
Your chest xray looks normal: "No acute abnormality of the lungs. No radiographic evidence of pulmonary tuberculosis."

## 2021-01-10 NOTE — ED Provider Notes (Signed)
[place]    CSN: [id number] Arrival date & time: 01/10/21  7829      History   Chief Complaint Chief Complaint  Patient presents with   chest x ray     HPI Karen [name] is a 60 y.o. female presenting for CXR. She does not tolerate PPD test and so work requires CXR. States she has never had an abnormal CXR. She does work in a healthcare setting. Feeling well, denies SOB, night sweats, weight loss, coughing blood, etc.   HPI  Past Medical History:  Diagnosis Date   Allergy    Anemia    past hx in her 20's   Diabetes mellitus without complication (HCC)    GERD (gastroesophageal reflux disease)    Hyperlipidemia    Controlled    Hypertension    Controlled     Patient Active Problem List   Diagnosis Date Noted   Hyperlipidemia associated with type 2 diabetes mellitus ([place]) 12/30/2018   Seasonal allergies 04/19/2017   Gastroesophageal reflux disease 04/19/2017   Diabetes mellitus ([place]) 02/14/2012   Hypertension associated with diabetes ([place]) 02/14/2012    Past Surgical History:  Procedure Laterality Date   ABDOMINAL HYSTERECTOMY     PID; ovaries resected.  Age 6.   APPENDECTOMY      OB History   No obstetric history on file.      Home Medications    Prior to Admission medications   Medication Sig Start Date End Date Taking? Authorizing Provider  amLODipine (NORVASC) 5 MG tablet Take 1 tablet by mouth once daily Patient not taking: Reported on 08/30/2020 09/26/19   Jacelyn Pi, Lilia Argue, MD  atorvastatin (LIPITOR) 10 MG tablet Take 1 tablet (10 mg total) by mouth daily. 04/26/20   Maximiano Coss, NP  blood glucose meter kit and supplies KIT Dispense based on patient and insurance preference. Use up to four times daily as directed. 09/05/20   Horald Pollen, MD  blood glucose meter kit and supplies Per insurance coverage. Check blood glucose once a day. Dx E11.65 02/15/20   Jacelyn Pi, Lilia Argue, MD  cetirizine (ZYRTEC) 10 MG tablet Take 1  tablet (10 mg total) by mouth daily. 04/26/20   Maximiano Coss, NP  Continuous Blood Gluc Sensor (DEXCOM G6 SENSOR) MISC USE AS DIRECTED 4 TIMES DAILY 12/21/20   Horald Pollen, MD  Continuous Blood Gluc Transmit (DEXCOM G6 TRANSMITTER) MISC USE AS DIRECTED 4 TIMES DAILY 11/06/20   Horald Pollen, MD  esomeprazole (NEXIUM) 40 MG capsule Take 1 capsule (40 mg total) by mouth daily. 04/26/20   Maximiano Coss, NP  methocarbamol (ROBAXIN) 500 MG tablet Take 1 tablet (500 mg total) by mouth 2 (two) times daily. 12/04/20   Raspet, Derry Skill, PA-C  SUMAtriptan (IMITREX) 50 MG tablet Take 1 tablet (50 mg total) by mouth every 2 (two) hours as needed for migraine (Max 2 doses daily). May repeat in 2 hours if headache persists or recurs. 04/26/20   Maximiano Coss, NP  tiZANidine (ZANAFLEX) 4 MG capsule Take 1 capsule (4 mg total) by mouth 3 (three) times daily as needed for muscle spasms. 12/03/20   Raspet, Derry Skill, PA-C  TRULICITY 3 FA/2.1HY SOPN INJECT 3 MG INTO THE SKIN ONCE A WEEK 10/18/20   Maximiano Coss, NP    Family History Family History  Problem Relation Age of Onset   Cancer Mother 31       uterine cancer   Uterine cancer Mother  Heart disease Sister 74       AMI/CAD with stenting   Hypertension Sister    Breast cancer Maternal Aunt    Colon cancer Neg Hx    Colon polyps Neg Hx    Esophageal cancer Neg Hx    Stomach cancer Neg Hx    Rectal cancer Neg Hx     Social History Social History   Tobacco Use   Smoking status: Former    Packs/day: 0.70    Years: 30.00    Pack years: 21.00    Types: Cigarettes   Smokeless tobacco: Never  Vaping Use   Vaping Use: Never used  Substance Use Topics   Alcohol use: No   Drug use: No     Allergies   Orange fruit [citrus] and Penicillins   Review of Systems Review of Systems  Constitutional:  Negative for appetite change, chills and fever.  HENT:  Negative for congestion, ear pain, rhinorrhea, sinus pressure, sinus pain and  sore throat.   Eyes:  Negative for redness and visual disturbance.  Respiratory:  Negative for cough, chest tightness, shortness of breath and wheezing.   Cardiovascular:  Negative for chest pain and palpitations.  Gastrointestinal:  Negative for abdominal pain, constipation, diarrhea, nausea and vomiting.  Genitourinary:  Negative for dysuria, frequency and urgency.  Musculoskeletal:  Negative for myalgias.  Neurological:  Negative for dizziness, weakness and headaches.  Psychiatric/Behavioral:  Negative for confusion.   All other systems reviewed and are negative.   Physical Exam Triage Vital Signs ED Triage Vitals  Enc Vitals Group     BP 01/10/21 0915 133/84     Pulse Rate 01/10/21 0915 87     Resp 01/10/21 0915 18     Temp 01/10/21 0915 97.7 F (36.5 C)     Temp Source 01/10/21 0915 Oral     SpO2 01/10/21 0915 98 %     Weight --      Height --      Head Circumference --      Peak Flow --      Pain Score 01/10/21 0914 0     Pain Loc --      Pain Edu? --      Excl. in [place]? --    No data found.  Updated Vital Signs BP 133/84 (BP Location: Left Arm)   Pulse 87   Temp 97.7 F (36.5 C) (Oral)   Resp 18   SpO2 98%   Visual Acuity Right Eye Distance:   Left Eye Distance:   Bilateral Distance:    Right Eye Near:   Left Eye Near:    Bilateral Near:     Physical Exam Vitals reviewed.  Constitutional:      General: She is not in acute distress.    Appearance: Normal appearance. She is not ill-appearing or diaphoretic.  HENT:     Head: Normocephalic and atraumatic.  Cardiovascular:     Rate and Rhythm: Normal rate and regular rhythm.     Heart sounds: Normal heart sounds.  Pulmonary:     Effort: Pulmonary effort is normal.     Breath sounds: Normal breath sounds.  Skin:    General: Skin is warm.  Neurological:     General: No focal deficit present.     Mental Status: She is alert and oriented to person, place, and time.  Psychiatric:        Mood and  Affect: Mood normal.  Behavior: Behavior normal.        Thought Content: Thought content normal.        Judgment: Judgment normal.     UC Treatments / Results  Labs (all labs ordered are listed, but only abnormal results are displayed) Labs Reviewed - No data to display  EKG   Radiology DG Chest 2 View  Result Date: 01/10/2021 CLINICAL DATA:  History of positive PPD EXAM: CHEST - 2 VIEW COMPARISON:  12/03/2013 FINDINGS: The heart size and mediastinal contours are within normal limits. Both lungs are clear. The visualized skeletal structures are unremarkable. IMPRESSION: No acute abnormality of the lungs. No radiographic evidence of pulmonary tuberculosis. Electronically Signed   By: Eddie Candle M.D.   On: 01/10/2021 09:47    Procedures Procedures (including critical care time)  Medications Ordered in UC Medications - No data to display  Initial Impression / Assessment and Plan / UC Course  I have reviewed the triage vital signs and the nursing notes.  Pertinent labs & imaging results that were available during my care of the patient were reviewed by me and considered in my medical decision making (see chart for details).     This patient is a very pleasant 60 y.o. year old female presenting for CXR. Does not tolerate the PPD so work is requiring CXR. CXR: No acute abnormality of the lungs. No radiographic evidence of pulmonary tuberculosis.  ED return precautions discussed. Patient verbalizes understanding and agreement.    Final Clinical Impressions(s) / UC Diagnoses   Final diagnoses:  Encounter for routine chest x-ray     Discharge Instructions      Your chest xray looks normal: "No acute abnormality of the lungs. No radiographic evidence of pulmonary tuberculosis."     ED Prescriptions   None    PDMP not reviewed this encounter.   Hazel Sams, PA-C 01/10/21 1012

## 2021-01-10 NOTE — ED Triage Notes (Signed)
Pt in for chest x ray for TB. States she can't take skin test due to intolerance so she needs x ray for her job

## 2021-01-11 MED ORDER — TRULICITY 3 MG/0.5ML ~~LOC~~ SOAJ: SUBCUTANEOUS | 0 refills | Status: DC

## 2021-01-11 NOTE — Telephone Encounter (Signed)
Medication refilled

## 2021-01-21 ENCOUNTER — Other Ambulatory Visit: Payer: Self-pay | Admitting: Emergency Medicine

## 2021-01-30 ENCOUNTER — Other Ambulatory Visit: Payer: Self-pay

## 2021-01-30 ENCOUNTER — Ambulatory Visit
Admission: RE | Admit: 2021-01-30 | Discharge: 2021-01-30 | Disposition: A | Discharge status: Home / Self Care | Payer: 59 | Source: Ambulatory Visit | Attending: Emergency Medicine | Admitting: Emergency Medicine

## 2021-01-30 DIAGNOSIS — Z1231 Encounter for screening mammogram for malignant neoplasm of breast: Secondary | ICD-10-CM

## 2021-02-03 ENCOUNTER — Other Ambulatory Visit: Payer: Self-pay | Admitting: Emergency Medicine

## 2021-02-03 DIAGNOSIS — R928 Other abnormal and inconclusive findings on diagnostic imaging of breast: Secondary | ICD-10-CM

## 2021-02-07 ENCOUNTER — Telehealth: Payer: Self-pay

## 2021-02-07 NOTE — Telephone Encounter (Signed)
-----   Message from [facility], MD sent at 02/03/2021  3:06 PM EDT ----- Call patient please.  Make sure she is aware of findings and recommendations for additional testing.  Thanks.

## 2021-02-07 NOTE — Telephone Encounter (Signed)
Patient aware of results and recommendations. °

## 2021-02-07 NOTE — Telephone Encounter (Signed)
Left message for patient to call if she has any questions or concerns regarding her results that she has seen on mychart.

## 2021-02-20 ENCOUNTER — Other Ambulatory Visit: Payer: Self-pay

## 2021-02-20 ENCOUNTER — Ambulatory Visit
Admission: RE | Admit: 2021-02-20 | Discharge: 2021-02-20 | Disposition: A | Discharge status: Home / Self Care | Payer: 59 | Source: Ambulatory Visit | Attending: Emergency Medicine | Admitting: Emergency Medicine

## 2021-02-20 DIAGNOSIS — R928 Other abnormal and inconclusive findings on diagnostic imaging of breast: Secondary | ICD-10-CM

## 2021-02-21 ENCOUNTER — Other Ambulatory Visit: Payer: Self-pay | Admitting: Emergency Medicine

## 2021-03-01 ENCOUNTER — Other Ambulatory Visit: Payer: Self-pay

## 2021-03-01 ENCOUNTER — Encounter: Payer: Self-pay | Admitting: Emergency Medicine

## 2021-03-01 ENCOUNTER — Ambulatory Visit (INDEPENDENT_AMBULATORY_CARE_PROVIDER_SITE_OTHER): Payer: 59 | Admitting: Emergency Medicine

## 2021-03-01 VITALS — BP 128/70 | HR 100 | Temp 98.5°F | Ht 63.0 in | Wt 194.0 lb

## 2021-03-01 DIAGNOSIS — K5909 Other constipation: Secondary | ICD-10-CM

## 2021-03-01 DIAGNOSIS — E1159 Type 2 diabetes mellitus with other circulatory complications: Secondary | ICD-10-CM

## 2021-03-01 DIAGNOSIS — E1169 Type 2 diabetes mellitus with other specified complication: Secondary | ICD-10-CM | POA: Diagnosis not present

## 2021-03-01 DIAGNOSIS — I152 Hypertension secondary to endocrine disorders: Secondary | ICD-10-CM

## 2021-03-01 DIAGNOSIS — E785 Hyperlipidemia, unspecified: Secondary | ICD-10-CM

## 2021-03-01 LAB — POCT GLYCOSYLATED HEMOGLOBIN (HGB A1C): Hemoglobin A1C: 7.2 % — AB (ref 4.0–5.6)

## 2021-03-01 MED ORDER — METHOCARBAMOL 500 MG PO TABS: 500.0000 mg | ORAL_TABLET | Freq: Two times a day (BID) | ORAL | 0 refills | Status: DC

## 2021-03-01 MED ORDER — LINACLOTIDE 145 MCG PO CAPS: 145.0000 ug | ORAL_CAPSULE | Freq: Every day | ORAL | 3 refills | Status: AC

## 2021-03-01 NOTE — Assessment & Plan Note (Signed)
Well-controlled hypertension off medications. Hemoglobin A1c higher than before at 7.2.  Patient has been noncompliant with diet. Continue Trulicity 3 mg weekly. Diet and nutrition discussed.

## 2021-03-01 NOTE — Patient Instructions (Signed)
Diabetes Mellitus and Nutrition, Adult When you have diabetes, or diabetes mellitus, it is very important to have healthy eating habits because your blood sugar (glucose) levels are greatly affected by what you eat and drink. Eating healthy foods in the right amounts, at about the same times every day, can help you:  Control your blood glucose.  Lower your risk of heart disease.  Improve your blood pressure.  Reach or maintain a healthy weight. What can affect my meal plan? Every person with diabetes is different, and each person has different needs for a meal plan. Your health care provider may recommend that you work with a dietitian to make a meal plan that is best for you. Your meal plan may vary depending on factors such as:  The calories you need.  The medicines you take.  Your weight.  Your blood glucose, blood pressure, and cholesterol levels.  Your activity level.  Other health conditions you have, such as heart or kidney disease. How do carbohydrates affect me? Carbohydrates, also called carbs, affect your blood glucose level more than any other type of food. Eating carbs naturally raises the amount of glucose in your blood. Carb counting is a method for keeping track of how many carbs you eat. Counting carbs is important to keep your blood glucose at a healthy level, especially if you use insulin or take certain oral diabetes medicines. It is important to know how many carbs you can safely have in each meal. This is different for every person. Your dietitian can help you calculate how many carbs you should have at each meal and for each snack. How does alcohol affect me? Alcohol can cause a sudden decrease in blood glucose (hypoglycemia), especially if you use insulin or take certain oral diabetes medicines. Hypoglycemia can be a life-threatening condition. Symptoms of hypoglycemia, such as sleepiness, dizziness, and confusion, are similar to symptoms of having too much  alcohol.  Do not drink alcohol if: ? Your health care provider tells you not to drink. ? You are pregnant, may be pregnant, or are planning to become pregnant.  If you drink alcohol: ? Do not drink on an empty stomach. ? Limit how much you use to:  0-1 drink a day for women.  0-2 drinks a day for men. ? Be aware of how much alcohol is in your drink. In the [place] one drink equals one 12 oz bottle of beer (355 mL), one 5 oz glass of wine (148 mL), or one 1 oz glass of hard liquor (44 mL). ? Keep yourself hydrated with water, diet soda, or unsweetened iced tea.  Keep in mind that regular soda, juice, and other mixers may contain a lot of sugar and must be counted as carbs. What are tips for following this plan? Reading food labels  Start by checking the serving size on the "Nutrition Facts" label of packaged foods and drinks. The amount of calories, carbs, fats, and other nutrients listed on the label is based on one serving of the item. Many items contain more than one serving per package.  Check the total grams (g) of carbs in one serving. You can calculate the number of servings of carbs in one serving by dividing the total carbs by 15. For example, if a food has 30 g of total carbs per serving, it would be equal to 2 servings of carbs.  Check the number of grams (g) of saturated fats and trans fats in one serving. Choose foods that have   a low amount or none of these fats.  Check the number of milligrams (mg) of salt (sodium) in one serving. Most people should limit total sodium intake to less than 2,300 mg per day.  Always check the nutrition information of foods labeled as "low-fat" or "nonfat." These foods may be higher in added sugar or refined carbs and should be avoided.  Talk to your dietitian to identify your daily goals for nutrients listed on the label. Shopping  Avoid buying canned, pre-made, or processed foods. These foods tend to be high in fat, sodium, and added  sugar.  Shop around the outside edge of the grocery store. This is where you will most often find fresh fruits and vegetables, bulk grains, fresh meats, and fresh dairy. Cooking  Use low-heat cooking methods, such as baking, instead of high-heat cooking methods like deep frying.  Cook using healthy oils, such as olive, canola, or sunflower oil.  Avoid cooking with butter, cream, or high-fat meats. Meal planning  Eat meals and snacks regularly, preferably at the same times every day. Avoid going long periods of time without eating.  Eat foods that are high in fiber, such as fresh fruits, vegetables, beans, and whole grains. Talk with your dietitian about how many servings of carbs you can eat at each meal.  Eat 4-6 oz (112-168 g) of lean protein each day, such as lean meat, chicken, fish, eggs, or tofu. One ounce (oz) of lean protein is equal to: ? 1 oz (28 g) of meat, chicken, or fish. ? 1 egg. ?  cup (62 g) of tofu.  Eat some foods each day that contain healthy fats, such as avocado, nuts, seeds, and fish.   What foods should I eat? Fruits Berries. Apples. Oranges. Peaches. Apricots. Plums. Grapes. Mango. Papaya. Pomegranate. Kiwi. Cherries. Vegetables Lettuce. Spinach. Leafy greens, including kale, chard, collard greens, and mustard greens. Beets. Cauliflower. Cabbage. Broccoli. Carrots. Green beans. Tomatoes. Peppers. Onions. Cucumbers. Brussels sprouts. Grains Whole grains, such as whole-wheat or whole-grain bread, crackers, tortillas, cereal, and pasta. Unsweetened oatmeal. Quinoa. Brown or wild rice. Meats and other proteins Seafood. Poultry without skin. Lean cuts of poultry and beef. Tofu. Nuts. Seeds. Dairy Low-fat or fat-free dairy products such as milk, yogurt, and cheese. The items listed above may not be a complete list of foods and beverages you can eat. Contact a dietitian for more information. What foods should I avoid? Fruits Fruits canned with  syrup. Vegetables Canned vegetables. Frozen vegetables with butter or cream sauce. Grains Refined white flour and flour products such as bread, pasta, snack foods, and cereals. Avoid all processed foods. Meats and other proteins Fatty cuts of meat. Poultry with skin. Breaded or fried meats. Processed meat. Avoid saturated fats. Dairy Full-fat yogurt, cheese, or milk. Beverages Sweetened drinks, such as soda or iced tea. The items listed above may not be a complete list of foods and beverages you should avoid. Contact a dietitian for more information. Questions to ask a health care provider  Do I need to meet with a diabetes educator?  Do I need to meet with a dietitian?  What number can I call if I have questions?  When are the best times to check my blood glucose? Where to find more information:  American Diabetes Association: diabetes.org  Academy of Nutrition and Dietetics: www.eatright.org  National Institute of Diabetes and Digestive and Kidney Diseases: www.niddk.nih.gov  Association of Diabetes Care and Education Specialists: www.diabeteseducator.org Summary  It is important to have healthy eating   habits because your blood sugar (glucose) levels are greatly affected by what you eat and drink.  A healthy meal plan will help you control your blood glucose and maintain a healthy lifestyle.  Your health care provider may recommend that you work with a dietitian to make a meal plan that is best for you.  Keep in mind that carbohydrates (carbs) and alcohol have immediate effects on your blood glucose levels. It is important to count carbs and to use alcohol carefully. This information is not intended to replace advice given to you by your health care provider. Make sure you discuss any questions you have with your health care provider. Document Revised: 04/14/2019 Document Reviewed: 04/14/2019 Elsevier Patient Education  2021 [employer]  

## 2021-03-01 NOTE — Progress Notes (Signed)
Karen [name] 60 y.o.   Chief Complaint  Patient presents with   Diabetes    F/u   Last office visit assessment and plan as follows: ASSESSMENT & PLAN: Hypertension associated with diabetes (Karen [name]) Normotensive off medications. Much improved hemoglobin A1c at 7.0.  Continue Trulicity 3 mg weekly. Diet and nutrition discussed. Continue Lipitor 10 mg daily. Follow-up in 6 months. Karen [name] was seen today for diabetes and urinary frequency.   HISTORY OF PRESENT ILLNESS: This is a 61 y.o. female A1A here for follow-up of diabetes and hypertension. Presently on Trulicity 3 mg weekly. Well-controlled hypertension off medications. Gained some weight due to concerns about breast mass.  Work-up was negative for cancer.  Diabetes Pertinent negatives for hypoglycemia include no dizziness or headaches. Pertinent negatives for diabetes include no chest pain.    Prior to Admission medications   Medication Sig Start Date End Date Taking? Authorizing Provider  atorvastatin (LIPITOR) 10 MG tablet Take 1 tablet (10 mg total) by mouth daily. 04/26/20  Yes Maximiano Coss, NP  blood glucose meter kit and supplies Per insurance coverage. Check blood glucose once a day. Dx E11.65 02/15/20  Yes Jacelyn Pi, Lilia Argue, MD  cetirizine (ZYRTEC) 10 MG tablet Take 1 tablet (10 mg total) by mouth daily. 04/26/20  Yes Maximiano Coss, NP  Continuous Blood Gluc Sensor (DEXCOM G6 SENSOR) MISC USE AS DIRECTED 4 TIMES DAILY 02/21/21  Yes Clary Boulais, Ines Bloomer, MD  Continuous Blood Gluc Transmit (DEXCOM G6 TRANSMITTER) MISC USE AS DIRECTED 4 TIMES DAILY 02/21/21  Yes Deejay Koppelman, Ines Bloomer, MD  Dulaglutide (TRULICITY) 3 BW/6.2MB SOPN INJECT 3 MG INTO THE SKIN ONCE A WEEK 01/11/21  Yes Zayley Arras, Ines Bloomer, MD  esomeprazole (NEXIUM) 40 MG capsule Take 1 capsule (40 mg total) by mouth daily. 04/26/20  Yes Maximiano Coss, NP  methocarbamol (ROBAXIN) 500 MG tablet Take 1 tablet (500 mg total) by mouth 2 (two) times daily.  12/04/20  Yes Raspet, Junie Panning K, PA-C  SUMAtriptan (IMITREX) 50 MG tablet Take 1 tablet (50 mg total) by mouth every 2 (two) hours as needed for migraine (Max 2 doses daily). May repeat in 2 hours if headache persists or recurs. 04/26/20  Yes Maximiano Coss, NP  tiZANidine (ZANAFLEX) 4 MG capsule Take 1 capsule (4 mg total) by mouth 3 (three) times daily as needed for muscle spasms. 12/03/20  Yes Raspet, Derry Skill, PA-C  amLODipine (NORVASC) 5 MG tablet Take 1 tablet by mouth once daily Patient not taking: Reported on 03/01/2021 09/26/19   Jacelyn Pi, Lilia Argue, MD  blood glucose meter kit and supplies KIT Dispense based on patient and insurance preference. Use up to four times daily as directed. Patient not taking: Reported on 03/01/2021 09/05/20   Horald Pollen, MD    Allergies  Allergen Reactions   Orange Fruit [Citrus] Hives   Penicillins Hives    Patient Active Problem List   Diagnosis Date Noted   Hyperlipidemia associated with type 2 diabetes mellitus ([place]) 12/30/2018   Seasonal allergies 04/19/2017   Gastroesophageal reflux disease 04/19/2017   Diabetes mellitus ([place]) 02/14/2012   Hypertension associated with diabetes ([place]) 02/14/2012    Past Medical History:  Diagnosis Date   Allergy    Anemia    past hx in her 20's   Diabetes mellitus without complication (HCC)    GERD (gastroesophageal reflux disease)    Hyperlipidemia    Controlled    Hypertension    Controlled     Past Surgical History:  Procedure Laterality Date   ABDOMINAL HYSTERECTOMY     PID; ovaries resected.  Age 55.   APPENDECTOMY      Social History   Socioeconomic History   Marital status: Divorced    Spouse name: Not on file   Number of children: Not on file   Years of education: Not on file   Highest education level: Not on file  Occupational History   Not on file  Tobacco Use   Smoking status: Former    Packs/day: 0.70    Years: 30.00    Pack years: 21.00    Types: Cigarettes    Smokeless tobacco: Never  Vaping Use   Vaping Use: Never used  Substance and Sexual Activity   Alcohol use: No   Drug use: No   Sexual activity: Yes    Birth control/protection: None  Other Topics Concern   Not on file  Social History Narrative   Marital status: divorced; not dating      Children:  None      Lives: alone      Employment: Radio broadcast assistant       Tobacco: sometimes; smokes 1 pack per week       Alcohol:  None      Drugs: none      Exercise:  sporadic   Social Determinants of Radio broadcast assistant Strain: Not on file  Food Insecurity: Not on file  Transportation Needs: Not on file  Physical Activity: Not on file  Stress: Not on file  Social Connections: Not on file  Intimate Partner Violence: Not on file    Family History  Problem Relation Age of Onset   Cancer Mother 21       uterine cancer   Uterine cancer Mother    Heart disease Sister 28       AMI/CAD with stenting   Hypertension Sister    Breast cancer Maternal Aunt    Colon cancer Neg Hx    Colon polyps Neg Hx    Esophageal cancer Neg Hx    Stomach cancer Neg Hx    Rectal cancer Neg Hx      Review of Systems  Constitutional: Negative.  Negative for chills and fever.  HENT: Negative.  Negative for congestion and sore throat.   Respiratory: Negative.  Negative for cough and shortness of breath.   Cardiovascular: Negative.  Negative for chest pain and palpitations.  Gastrointestinal: Negative.  Negative for abdominal pain, nausea and vomiting.  Genitourinary: Negative.  Negative for dysuria.  Skin: Negative.  Negative for rash.  Neurological:  Negative for dizziness and headaches.  All other systems reviewed and are negative.  Vitals:   03/01/21 1432  BP: 128/70  Pulse: 100  Temp: 98.5 F (36.9 C)  SpO2: 96%   Wt Readings from Last 3 Encounters:  03/01/21 194 lb (88 kg)  08/30/20 185 lb (83.9 kg)  05/18/20 182 lb (82.6 kg)    Physical Exam Vitals reviewed.   Constitutional:      Appearance: Normal appearance.  HENT:     Head: Normocephalic.  Eyes:     Extraocular Movements: Extraocular movements intact.     Conjunctiva/sclera: Conjunctivae normal.     Pupils: Pupils are equal, round, and reactive to light.  Cardiovascular:     Rate and Rhythm: Normal rate and regular rhythm.     Pulses: Normal pulses.     Heart sounds: Normal heart sounds.  Pulmonary:     Effort: Pulmonary  effort is normal.     Breath sounds: Normal breath sounds.  Abdominal:     General: Bowel sounds are normal. There is no distension.     Palpations: Abdomen is soft.     Tenderness: There is no abdominal tenderness.  Musculoskeletal:     Cervical back: Normal range of motion and neck supple.  Skin:    General: Skin is warm and dry.     Capillary Refill: Capillary refill takes less than 2 seconds.  Neurological:     General: No focal deficit present.     Mental Status: She is alert and oriented to person, place, and time.  Psychiatric:        Mood and Affect: Mood normal.        Behavior: Behavior normal.    Results for orders placed or performed in visit on 03/01/21 (from the past 24 hour(s))  POCT glycosylated hemoglobin (Hb A1C)     Status: Abnormal   Collection Time: 03/01/21  2:36 PM  Result Value Ref Range   Hemoglobin A1C 7.2 (A) 4.0 - 5.6 %   HbA1c POC (<> result, manual entry)     HbA1c, POC (prediabetic range)     HbA1c, POC (controlled diabetic range)      ASSESSMENT & PLAN: Problem List Items Addressed This Visit       Cardiovascular and Mediastinum   Hypertension associated with diabetes ([place]) - Primary    Well-controlled hypertension off medications. Hemoglobin A1c higher than before at 7.2.  Patient has been noncompliant with diet. Continue Trulicity 3 mg weekly. Diet and nutrition discussed.      Relevant Orders   POCT glycosylated hemoglobin (Hb A1C) (Completed)     Digestive   Chronic constipation    Has tried  over-the-counter fiber supplements and laxatives without success. Affecting quality of life. Will start Linzess 145 mg daily.      Relevant Medications   linaclotide (LINZESS) 145 MCG CAPS capsule     Endocrine   Hyperlipidemia associated with type 2 diabetes mellitus ([place])    Diet and nutrition discussed.  Continue atorvastatin 10 mg daily. The 10-year ASCVD risk score (Arnett DK, et al., 2019) is: 8.9%   Values used to calculate the score:     Age: 31 years     Sex: Female     Is Non-Hispanic African American: Yes     Diabetic: Yes     Tobacco smoker: No     Systolic Blood Pressure: 378 mmHg     Is BP treated: No     HDL Cholesterol: 49 mg/dL     Total Cholesterol: 160 mg/dL       Patient Instructions  Diabetes Mellitus and Nutrition, Adult When you have diabetes, or diabetes mellitus, it is very important to have healthy eating habits because your blood sugar (glucose) levels are greatly affected by what you eat and drink. Eating healthy foods in the right amounts, at about the same times every day, can help you: Control your blood glucose. Lower your risk of heart disease. Improve your blood pressure. Reach or maintain a healthy weight. What can affect my meal plan? Every person with diabetes is different, and each person has different needs for a meal plan. Your health care provider may recommend that you work with a dietitian to make a meal plan that is best for you. Your meal plan may vary depending on factors such as: The calories you need. The medicines you take. Your weight. Your  blood glucose, blood pressure, and cholesterol levels. Your activity level. Other health conditions you have, such as heart or kidney disease. How do carbohydrates affect me? Carbohydrates, also called carbs, affect your blood glucose level more than any other type of food. Eating carbs naturally raises the amount of glucose in your blood. Carb counting is a method for keeping track of how  many carbs you eat. Counting carbs is important to keep your blood glucose at a healthy level, especially if you use insulin or take certain oral diabetes medicines. It is important to know how many carbs you can safely have in each meal. This is different for every person. Your dietitian can help you calculate how many carbs you should have at each meal and for each snack. How does alcohol affect me? Alcohol can cause a sudden decrease in blood glucose (hypoglycemia), especially if you use insulin or take certain oral diabetes medicines. Hypoglycemia can be a life-threatening condition. Symptoms of hypoglycemia, such as sleepiness, dizziness, and confusion, are similar to symptoms of having too much alcohol. Do not drink alcohol if: Your health care provider tells you not to drink. You are pregnant, may be pregnant, or are planning to become pregnant. If you drink alcohol: Do not drink on an empty stomach. Limit how much you use to: 0-1 drink a day for women. 0-2 drinks a day for men. Be aware of how much alcohol is in your drink. In the [place] one drink equals one 12 oz bottle of beer (355 mL), one 5 oz glass of wine (148 mL), or one 1 oz glass of hard liquor (44 mL). Keep yourself hydrated with water, diet soda, or unsweetened iced tea. Keep in mind that regular soda, juice, and other mixers may contain a lot of sugar and must be counted as carbs. What are tips for following this plan? Reading food labels Start by checking the serving size on the "Nutrition Facts" label of packaged foods and drinks. The amount of calories, carbs, fats, and other nutrients listed on the label is based on one serving of the item. Many items contain more than one serving per package. Check the total grams (g) of carbs in one serving. You can calculate the number of servings of carbs in one serving by dividing the total carbs by 15. For example, if a food has 30 g of total carbs per serving, it would be equal to 2  servings of carbs. Check the number of grams (g) of saturated fats and trans fats in one serving. Choose foods that have a low amount or none of these fats. Check the number of milligrams (mg) of salt (sodium) in one serving. Most people should limit total sodium intake to less than 2,300 mg per day. Always check the nutrition information of foods labeled as "low-fat" or "nonfat." These foods may be higher in added sugar or refined carbs and should be avoided. Talk to your dietitian to identify your daily goals for nutrients listed on the label. Shopping Avoid buying canned, pre-made, or processed foods. These foods tend to be high in fat, sodium, and added sugar. Shop around the outside edge of the grocery store. This is where you will most often find fresh fruits and vegetables, bulk grains, fresh meats, and fresh dairy. Cooking Use low-heat cooking methods, such as baking, instead of high-heat cooking methods like deep frying. Cook using healthy oils, such as olive, canola, or sunflower oil. Avoid cooking with butter, cream, or high-fat meats. Meal planning  Eat meals and snacks regularly, preferably at the same times every day. Avoid going long periods of time without eating. Eat foods that are high in fiber, such as fresh fruits, vegetables, beans, and whole grains. Talk with your dietitian about how many servings of carbs you can eat at each meal. Eat 4-6 oz (112-168 g) of lean protein each day, such as lean meat, chicken, fish, eggs, or tofu. One ounce (oz) of lean protein is equal to: 1 oz (28 g) of meat, chicken, or fish. 1 egg.  cup (62 g) of tofu. Eat some foods each day that contain healthy fats, such as avocado, nuts, seeds, and fish. What foods should I eat? Fruits Berries. Apples. Oranges. Peaches. Apricots. Plums. Grapes. Mango. Papaya. Pomegranate. Kiwi. Cherries. Vegetables Lettuce. Spinach. Leafy greens, including kale, chard, collard greens, and mustard greens. Beets.  Cauliflower. Cabbage. Broccoli. Carrots. Green beans. Tomatoes. Peppers. Onions. Cucumbers. Brussels sprouts. Grains Whole grains, such as whole-wheat or whole-grain bread, crackers, tortillas, cereal, and pasta. Unsweetened oatmeal. Quinoa. Brown or wild rice. Meats and other proteins Seafood. Poultry without skin. Lean cuts of poultry and beef. Tofu. Nuts. Seeds. Dairy Low-fat or fat-free dairy products such as milk, yogurt, and cheese. The items listed above may not be a complete list of foods and beverages you can eat. Contact a dietitian for more information. What foods should I avoid? Fruits Fruits canned with syrup. Vegetables Canned vegetables. Frozen vegetables with butter or cream sauce. Grains Refined white flour and flour products such as bread, pasta, snack foods, and cereals. Avoid all processed foods. Meats and other proteins Fatty cuts of meat. Poultry with skin. Breaded or fried meats. Processed meat. Avoid saturated fats. Dairy Full-fat yogurt, cheese, or milk. Beverages Sweetened drinks, such as soda or iced tea. The items listed above may not be a complete list of foods and beverages you should avoid. Contact a dietitian for more information. Questions to ask a health care provider Do I need to meet with a diabetes educator? Do I need to meet with a dietitian? What number can I call if I have questions? When are the best times to check my blood glucose? Where to find more information: American Diabetes Association: diabetes.org Academy of Nutrition and Dietetics: www.eatright.[employer] of Diabetes and Digestive and Kidney Diseases: [employer] Association of Diabetes Care and Education Specialists: www.diabeteseducator.org Summary It is important to have healthy eating habits because your blood sugar (glucose) levels are greatly affected by what you eat and drink. A healthy meal plan will help you control your blood glucose and maintain a  healthy lifestyle. Your health care provider may recommend that you work with a dietitian to make a meal plan that is best for you. Keep in mind that carbohydrates (carbs) and alcohol have immediate effects on your blood glucose levels. It is important to count carbs and to use alcohol carefully. This information is not intended to replace advice given to you by your health care provider. Make sure you discuss any questions you have with your health care provider. Document Revised: 04/14/2019 Document Reviewed: 04/14/2019 Elsevier Patient Education  2021 [place], MD [facility]

## 2021-03-01 NOTE — Assessment & Plan Note (Signed)
Has tried over-the-counter fiber supplements and laxatives without success. Affecting quality of life. Will start Linzess 145 mg daily.

## 2021-03-01 NOTE — Assessment & Plan Note (Signed)
Diet and nutrition discussed.  Continue atorvastatin 10 mg daily. The 10-year ASCVD risk score (Arnett DK, et al., 2019) is: 8.9%   Values used to calculate the score:     Age: 60 years     Sex: Female     Is Non-Hispanic African American: Yes     Diabetic: Yes     Tobacco smoker: No     Systolic Blood Pressure: 003 mmHg     Is BP treated: No     HDL Cholesterol: 49 mg/dL     Total Cholesterol: 160 mg/dL

## 2021-03-28 ENCOUNTER — Other Ambulatory Visit: Payer: Self-pay

## 2021-03-28 ENCOUNTER — Other Ambulatory Visit: Payer: Self-pay | Admitting: Emergency Medicine

## 2021-03-28 ENCOUNTER — Telehealth: Payer: Self-pay | Admitting: Emergency Medicine

## 2021-03-28 DIAGNOSIS — E1165 Type 2 diabetes mellitus with hyperglycemia: Secondary | ICD-10-CM

## 2021-03-28 MED ORDER — DEXCOM G6 TRANSMITTER MISC: 3 refills | Status: DC

## 2021-03-28 NOTE — Telephone Encounter (Signed)
1.Medication Requested: Continuous Blood Gluc Sensor (DEXCOM G6 SENSOR) MISC  2. Pharmacy (Name, Street, [place]): [facility] Phone:  [phone]  Fax:  [phone]     3. On Med List: Y  4. Last Visit with PCP: 10.12.2022  5. Next visit date with PCP: 4.12.2023   Agent: Please be advised that RX refills may take up to 3 business days. We ask that you follow-up with your pharmacy.

## 2021-04-16 ENCOUNTER — Other Ambulatory Visit: Payer: Self-pay | Admitting: Emergency Medicine

## 2021-04-16 DIAGNOSIS — E119 Type 2 diabetes mellitus without complications: Secondary | ICD-10-CM

## 2021-04-26 ENCOUNTER — Other Ambulatory Visit: Payer: Self-pay | Admitting: Emergency Medicine

## 2021-05-24 ENCOUNTER — Telehealth: Payer: Self-pay | Admitting: Emergency Medicine

## 2021-05-24 NOTE — Telephone Encounter (Signed)
Patient came in stating she need prior authorization for her transmitter and that it will run out in two weeks. Patient asked for a call back at [phone]

## 2021-05-26 NOTE — Telephone Encounter (Signed)
Pt calling back in wanting an update on PA transmitter. Transmitter is going to run out in a week per pt.

## 2021-05-27 ENCOUNTER — Other Ambulatory Visit: Payer: Self-pay | Admitting: Emergency Medicine

## 2021-05-30 NOTE — Telephone Encounter (Signed)
PA Key: [id number], Dexcom G6 transmitter. Waiting for insurance approval or denial.

## 2021-06-01 NOTE — Telephone Encounter (Signed)
Your PA request has been denied 

## 2021-06-01 NOTE — Telephone Encounter (Signed)
Patient returning nurse's call, informed patient PA was resubmitted on 05-30-2021  Advised patient PA is still processing and could take up to 72 hours for a response

## 2021-06-07 ENCOUNTER — Encounter: Payer: Self-pay | Admitting: Emergency Medicine

## 2021-06-07 ENCOUNTER — Other Ambulatory Visit: Payer: Self-pay

## 2021-06-07 ENCOUNTER — Ambulatory Visit (INDEPENDENT_AMBULATORY_CARE_PROVIDER_SITE_OTHER): Payer: 59 | Admitting: Emergency Medicine

## 2021-06-07 VITALS — BP 118/72 | HR 103 | Ht 63.0 in | Wt 197.0 lb

## 2021-06-07 DIAGNOSIS — I152 Hypertension secondary to endocrine disorders: Secondary | ICD-10-CM

## 2021-06-07 DIAGNOSIS — E785 Hyperlipidemia, unspecified: Secondary | ICD-10-CM

## 2021-06-07 DIAGNOSIS — E1165 Type 2 diabetes mellitus with hyperglycemia: Secondary | ICD-10-CM | POA: Diagnosis not present

## 2021-06-07 DIAGNOSIS — E1169 Type 2 diabetes mellitus with other specified complication: Secondary | ICD-10-CM | POA: Diagnosis not present

## 2021-06-07 DIAGNOSIS — E1159 Type 2 diabetes mellitus with other circulatory complications: Secondary | ICD-10-CM

## 2021-06-07 LAB — COMPREHENSIVE METABOLIC PANEL
ALT: 13 U/L (ref 0–35)
AST: 14 U/L (ref 0–37)
Albumin: 4.1 g/dL (ref 3.5–5.2)
Alkaline Phosphatase: 119 U/L — ABNORMAL HIGH (ref 39–117)
BUN: 12 mg/dL (ref 6–23)
CO2: 27 mEq/L (ref 19–32)
Calcium: 10 mg/dL (ref 8.4–10.5)
Chloride: 103 mEq/L (ref 96–112)
Creatinine, Ser: 0.78 mg/dL (ref 0.40–1.20)
GFR: 83.28 mL/min (ref >60.00)
Glucose, Bld: 135 mg/dL — ABNORMAL HIGH (ref 70–99)
Potassium: 4.4 mEq/L (ref 3.5–5.1)
Sodium: 140 mEq/L (ref 135–145)
Total Bilirubin: 0.4 mg/dL (ref 0.2–1.2)
Total Protein: 8.8 g/dL — ABNORMAL HIGH (ref 6.0–8.3)

## 2021-06-07 LAB — LIPID PANEL
Cholesterol: 176 mg/dL (ref 0–200)
HDL: 46.9 mg/dL (ref >39.00)
LDL Cholesterol: 114 mg/dL — ABNORMAL HIGH (ref 0–99)
NonHDL: 128.68
Total CHOL/HDL Ratio: 4
Triglycerides: 73 mg/dL (ref 0.0–149.0)
VLDL: 14.6 mg/dL (ref 0.0–40.0)

## 2021-06-07 LAB — POCT GLYCOSYLATED HEMOGLOBIN (HGB A1C): Hemoglobin A1C: 7.6 % — AB (ref 4.0–5.6)

## 2021-06-07 MED ORDER — DAPAGLIFLOZIN PROPANEDIOL 10 MG PO TABS: 10.0000 mg | ORAL_TABLET | Freq: Every day | ORAL | 3 refills | Status: AC

## 2021-06-07 NOTE — Patient Instructions (Signed)

## 2021-06-07 NOTE — Progress Notes (Signed)
Karen [name] 61 y.o.   Chief Complaint  Patient presents with   Diabetes    Discuss  diabetic medication, insurance no longer approves dexcom diabetic supplies    HISTORY OF PRESENT ILLNESS: This is a 61 y.o. female with history of diabetes here for follow-up. Presently on weekly Trulicity 3 mg.  No other diabetic medication. Hypertension well controlled, off medications. No other complaints or medical concerns today. Lab Results  Component Value Date   HGBA1C 7.6 (A) 06/07/2021     HPI   Prior to Admission medications   Medication Sig Start Date End Date Taking? Authorizing Provider  atorvastatin (LIPITOR) 10 MG tablet Take 1 tablet (10 mg total) by mouth daily. 04/26/20   Maximiano Coss, NP  blood glucose meter kit and supplies KIT Dispense based on patient and insurance preference. Use up to four times daily as directed. Patient not taking: Reported on 03/01/2021 09/05/20   Horald Pollen, MD  blood glucose meter kit and supplies Per insurance coverage. Check blood glucose once a day. Dx E11.65 02/15/20   Jacelyn Pi, Lilia Argue, MD  cetirizine (ZYRTEC) 10 MG tablet Take 1 tablet (10 mg total) by mouth daily. 04/26/20   Maximiano Coss, NP  Continuous Blood Gluc Sensor (DEXCOM G6 SENSOR) MISC USE AS DIRECTED 4 TIMES DAILY 05/29/21   Horald Pollen, MD  Continuous Blood Gluc Transmit (DEXCOM G6 TRANSMITTER) MISC USE AS DIRECTED 4 TIMES DAILY 03/28/21   Horald Pollen, MD  Dulaglutide (TRULICITY) 3 LK/4.4WN SOPN INJECT 3MG  SUBCUTANEOUSLY ONCE A WEEK 04/17/21   Horald Pollen, MD  esomeprazole (NEXIUM) 40 MG capsule Take 1 capsule (40 mg total) by mouth daily. 04/26/20   Maximiano Coss, NP  linaclotide [facility]) 145 MCG CAPS capsule Take 1 capsule (145 mcg total) by mouth daily before breakfast. 03/01/21   Tres Grzywacz, Ines Bloomer, MD  methocarbamol (ROBAXIN) 500 MG tablet Take 1 tablet (500 mg total) by mouth 2 (two) times daily. 03/01/21   Horald Pollen, MD  SUMAtriptan (IMITREX) 50 MG tablet Take 1 tablet (50 mg total) by mouth every 2 (two) hours as needed for migraine (Max 2 doses daily). May repeat in 2 hours if headache persists or recurs. 04/26/20   Maximiano Coss, NP  tiZANidine (ZANAFLEX) 4 MG capsule Take 1 capsule (4 mg total) by mouth 3 (three) times daily as needed for muscle spasms. 12/03/20   Raspet, Derry Skill, PA-C    Allergies  Allergen Reactions   Orange Fruit [Citrus] Hives   Penicillins Hives    Patient Active Problem List   Diagnosis Date Noted   Chronic constipation 03/01/2021   Hyperlipidemia associated with type 2 diabetes mellitus ([place]) 12/30/2018   Seasonal allergies 04/19/2017   Gastroesophageal reflux disease 04/19/2017   Diabetes mellitus ([place]) 02/14/2012   Hypertension associated with diabetes ([place]) 02/14/2012    Past Medical History:  Diagnosis Date   Allergy    Anemia    past hx in her 20's   Diabetes mellitus without complication (HCC)    GERD (gastroesophageal reflux disease)    Hyperlipidemia    Controlled    Hypertension    Controlled     Past Surgical History:  Procedure Laterality Date   ABDOMINAL HYSTERECTOMY     PID; ovaries resected.  Age 67.   APPENDECTOMY      Social History   Socioeconomic History   Marital status: Divorced    Spouse name: Not on file   Number of children:  Not on file   Years of education: Not on file   Highest education level: Not on file  Occupational History   Not on file  Tobacco Use   Smoking status: Former    Packs/day: 0.70    Years: 30.00    Pack years: 21.00    Types: Cigarettes   Smokeless tobacco: Never  Vaping Use   Vaping Use: Never used  Substance and Sexual Activity   Alcohol use: No   Drug use: No   Sexual activity: Yes    Birth control/protection: None  Other Topics Concern   Not on file  Social History Narrative   Marital status: divorced; not dating      Children:  None      Lives: alone      Employment: Designer, jewellery       Tobacco: sometimes; smokes 1 pack per week       Alcohol:  None      Drugs: none      Exercise:  sporadic   Social Determinants of Radio broadcast assistant Strain: Not on file  Food Insecurity: Not on file  Transportation Needs: Not on file  Physical Activity: Not on file  Stress: Not on file  Social Connections: Not on file  Intimate Partner Violence: Not on file    Family History  Problem Relation Age of Onset   Cancer Mother 33       uterine cancer   Uterine cancer Mother    Heart disease Sister 7       AMI/CAD with stenting   Hypertension Sister    Breast cancer Maternal Aunt    Colon cancer Neg Hx    Colon polyps Neg Hx    Esophageal cancer Neg Hx    Stomach cancer Neg Hx    Rectal cancer Neg Hx      Review of Systems  Constitutional: Negative.  Negative for chills and fever.  HENT: Negative.  Negative for congestion and sore throat.   Eyes: Negative.   Respiratory: Negative.  Negative for cough and shortness of breath.   Cardiovascular: Negative.  Negative for chest pain and palpitations.  Gastrointestinal: Negative.  Negative for abdominal pain, blood in stool, diarrhea, melena, nausea and vomiting.  Genitourinary: Negative.  Negative for dysuria and hematuria.  Skin: Negative.  Negative for rash.  Neurological: Negative.  Negative for dizziness and headaches.  All other systems reviewed and are negative.  Today's Vitals   06/07/21 0854  BP: 118/72  Pulse: (!) 103  SpO2: 96%  Weight: 197 lb (89.4 kg)  Height: 5' 3"  (1.6 m)   Body mass index is 34.9 kg/m. Wt Readings from Last 3 Encounters:  06/07/21 197 lb (89.4 kg)  03/01/21 194 lb (88 kg)  08/30/20 185 lb (83.9 kg)    Physical Exam Vitals reviewed.  Constitutional:      Appearance: Normal appearance.  HENT:     Head: Normocephalic.  Eyes:     Extraocular Movements: Extraocular movements intact.     Pupils: Pupils are equal, round, and reactive to light.  Cardiovascular:      Rate and Rhythm: Normal rate and regular rhythm.     Pulses: Normal pulses.     Heart sounds: Normal heart sounds.  Pulmonary:     Effort: Pulmonary effort is normal.     Breath sounds: Normal breath sounds.  Musculoskeletal:        General: Normal range of motion.  Cervical back: No tenderness.  Lymphadenopathy:     Cervical: No cervical adenopathy.  Skin:    General: Skin is warm and dry.     Capillary Refill: Capillary refill takes less than 2 seconds.  Neurological:     General: No focal deficit present.     Mental Status: She is alert and oriented to person, place, and time.  Psychiatric:        Mood and Affect: Mood normal.        Behavior: Behavior normal.   Results for orders placed or performed in visit on 06/07/21 (from the past 24 hour(s))  POCT glycosylated hemoglobin (Hb A1C)     Status: Abnormal   Collection Time: 06/07/21  9:04 AM  Result Value Ref Range   Hemoglobin A1C 7.6 (A) 4.0 - 5.6 %   HbA1c POC (<> result, manual entry)     HbA1c, POC (prediabetic range)     HbA1c, POC (controlled diabetic range)       ASSESSMENT & PLAN: A total of 45 minutes was spent with the patient and counseling/coordination of care regarding preparing for this visit, review of most recent office visit notes, review of most recent blood work results included today's hemoglobin A1c, review of all medications and changes made including addition of Farxiga 10 mg daily, side effects of Farxiga, education on nutrition, cardiovascular risks associated with uncontrolled diabetes, prognosis, documentation and need for follow-up in 3 months.  Problem List Items Addressed This Visit       Cardiovascular and Mediastinum   Hypertension associated with diabetes ([place]) - Primary    Well-controlled hypertension.  Off medications. Uncontrolled diabetes with hemoglobin A1c at 7.6, higher than before. Continue weekly Trulicity 3 mg.  We will start Farxiga 10 mg daily or Jardiance as per  insurance's formulary.  Intolerant to metformin. Diet and nutrition discussed. Follow-up in 3 months.      Relevant Medications   dapagliflozin propanediol (FARXIGA) 10 MG TABS tablet   Other Relevant Orders   Comprehensive metabolic panel     Endocrine   Diabetes mellitus (HCC)   Relevant Medications   dapagliflozin propanediol (FARXIGA) 10 MG TABS tablet   Other Relevant Orders   POCT glycosylated hemoglobin (Hb A1C) (Completed)   Hyperlipidemia associated with type 2 diabetes mellitus ([place])    Stable.  Diet and nutrition discussed.  Continue atorvastatin 10 mg daily. The 10-year ASCVD risk score (Arnett DK, et al., 2019) is: 7.7%   Values used to calculate the score:     Age: 63 years     Sex: Female     Is Non-Hispanic African American: Yes     Diabetic: Yes     Tobacco smoker: No     Systolic Blood Pressure: 366 mmHg     Is BP treated: No     HDL Cholesterol: 49 mg/dL     Total Cholesterol: 160 mg/dL       Relevant Medications   dapagliflozin propanediol (FARXIGA) 10 MG TABS tablet   Other Relevant Orders   Lipid panel   Patient Instructions  Diabetes Mellitus and Nutrition, Adult When you have diabetes, or diabetes mellitus, it is very important to have healthy eating habits because your blood sugar (glucose) levels are greatly affected by what you eat and drink. Eating healthy foods in the right amounts, at about the same times every day, can help you: Manage your blood glucose. Lower your risk of heart disease. Improve your blood pressure. Reach or maintain a  healthy weight. What can affect my meal plan? Every person with diabetes is different, and each person has different needs for a meal plan. Your health care provider may recommend that you work with a dietitian to make a meal plan that is best for you. Your meal plan may vary depending on factors such as: The calories you need. The medicines you take. Your weight. Your blood glucose, blood pressure, and  cholesterol levels. Your activity level. Other health conditions you have, such as heart or kidney disease. How do carbohydrates affect me? Carbohydrates, also called carbs, affect your blood glucose level more than any other type of food. Eating carbs raises the amount of glucose in your blood. It is important to know how many carbs you can safely have in each meal. This is different for every person. Your dietitian can help you calculate how many carbs you should have at each meal and for each snack. How does alcohol affect me? Alcohol can cause a decrease in blood glucose (hypoglycemia), especially if you use insulin or take certain diabetes medicines by mouth. Hypoglycemia can be a life-threatening condition. Symptoms of hypoglycemia, such as sleepiness, dizziness, and confusion, are similar to symptoms of having too much alcohol. Do not drink alcohol if: Your health care provider tells you not to drink. You are pregnant, may be pregnant, or are planning to become pregnant. If you drink alcohol: Limit how much you have to: 0-1 drink a day for women. 0-2 drinks a day for men. Know how much alcohol is in your drink. In the [place] one drink equals one 12 oz bottle of beer (355 mL), one 5 oz glass of wine (148 mL), or one 1 oz glass of hard liquor (44 mL). Keep yourself hydrated with water, diet soda, or unsweetened iced tea. Keep in mind that regular soda, juice, and other mixers may contain a lot of sugar and must be counted as carbs. What are tips for following this plan? Reading food labels Start by checking the serving size on the Nutrition Facts label of packaged foods and drinks. The number of calories and the amount of carbs, fats, and other nutrients listed on the label are based on one serving of the item. Many items contain more than one serving per package. Check the total grams (g) of carbs in one serving. Check the number of grams of saturated fats and trans fats in one serving.  Choose foods that have a low amount or none of these fats. Check the number of milligrams (mg) of salt (sodium) in one serving. Most people should limit total sodium intake to less than 2,300 mg per day. Always check the nutrition information of foods labeled as "low-fat" or "nonfat." These foods may be higher in added sugar or refined carbs and should be avoided. Talk to your dietitian to identify your daily goals for nutrients listed on the label. Shopping Avoid buying canned, pre-made, or processed foods. These foods tend to be high in fat, sodium, and added sugar. Shop around the outside edge of the grocery store. This is where you will most often find fresh fruits and vegetables, bulk grains, fresh meats, and fresh dairy products. Cooking Use low-heat cooking methods, such as baking, instead of high-heat cooking methods, such as deep frying. Cook using healthy oils, such as olive, canola, or sunflower oil. Avoid cooking with butter, cream, or high-fat meats. Meal planning Eat meals and snacks regularly, preferably at the same times every day. Avoid going long periods of  time without eating. Eat foods that are high in fiber, such as fresh fruits, vegetables, beans, and whole grains. Eat 4-6 oz (112-168 g) of lean protein each day, such as lean meat, chicken, fish, eggs, or tofu. One ounce (oz) (28 g) of lean protein is equal to: 1 oz (28 g) of meat, chicken, or fish. 1 egg.  cup (62 g) of tofu. Eat some foods each day that contain healthy fats, such as avocado, nuts, seeds, and fish. What foods should I eat? Fruits Berries. Apples. Oranges. Peaches. Apricots. Plums. Grapes. Mangoes. Papayas. Pomegranates. Kiwi. Cherries. Vegetables Leafy greens, including lettuce, spinach, kale, chard, collard greens, mustard greens, and cabbage. Beets. Cauliflower. Broccoli. Carrots. Green beans. Tomatoes. Peppers. Onions. Cucumbers. Brussels sprouts. Grains Whole grains, such as whole-wheat or  whole-grain bread, crackers, tortillas, cereal, and pasta. Unsweetened oatmeal. Quinoa. Brown or wild rice. Meats and other proteins Seafood. Poultry without skin. Lean cuts of poultry and beef. Tofu. Nuts. Seeds. Dairy Low-fat or fat-free dairy products such as milk, yogurt, and cheese. The items listed above may not be a complete list of foods and beverages you can eat and drink. Contact a dietitian for more information. What foods should I avoid? Fruits Fruits canned with syrup. Vegetables Canned vegetables. Frozen vegetables with butter or cream sauce. Grains Refined white flour and flour products such as bread, pasta, snack foods, and cereals. Avoid all processed foods. Meats and other proteins Fatty cuts of meat. Poultry with skin. Breaded or fried meats. Processed meat. Avoid saturated fats. Dairy Full-fat yogurt, cheese, or milk. Beverages Sweetened drinks, such as soda or iced tea. The items listed above may not be a complete list of foods and beverages you should avoid. Contact a dietitian for more information. Questions to ask a health care provider Do I need to meet with a certified diabetes care and education specialist? Do I need to meet with a dietitian? What number can I call if I have questions? When are the best times to check my blood glucose? Where to find more information: American Diabetes Association: diabetes.org Academy of Nutrition and Dietetics: eatright.[employer] of Diabetes and Digestive and Kidney Diseases: [employer] Association of Diabetes Care & Education Specialists: diabeteseducator.org Summary It is important to have healthy eating habits because your blood sugar (glucose) levels are greatly affected by what you eat and drink. It is important to use alcohol carefully. A healthy meal plan will help you manage your blood glucose and lower your risk of heart disease. Your health care provider may recommend that you work with a  dietitian to make a meal plan that is best for you. This information is not intended to replace advice given to you by your health care provider. Make sure you discuss any questions you have with your health care provider. Document Revised: 12/09/2019 Document Reviewed: 12/09/2019 Elsevier Patient Education  2022 [place], MD [facility]

## 2021-06-07 NOTE — Assessment & Plan Note (Addendum)
Well-controlled hypertension.  Off medications. Uncontrolled diabetes with hemoglobin A1c at 7.6, higher than before. Continue weekly Trulicity 3 mg.  We will start Farxiga 10 mg daily or Jardiance as per insurance's formulary.  Intolerant to metformin. Diet and nutrition discussed. Follow-up in 3 months.

## 2021-06-07 NOTE — Assessment & Plan Note (Signed)
Stable.  Diet and nutrition discussed.  Continue atorvastatin 10 mg daily. The 10-year ASCVD risk score (Arnett DK, et al., 2019) is: 7.7%   Values used to calculate the score:     Age: 61 years     Sex: Female     Is Non-Hispanic African American: Yes     Diabetic: Yes     Tobacco smoker: No     Systolic Blood Pressure: 391 mmHg     Is BP treated: No     HDL Cholesterol: 49 mg/dL     Total Cholesterol: 160 mg/dL

## 2021-07-01 ENCOUNTER — Other Ambulatory Visit: Payer: Self-pay | Admitting: Registered Nurse

## 2021-07-01 DIAGNOSIS — M25512 Pain in left shoulder: Secondary | ICD-10-CM

## 2021-07-05 ENCOUNTER — Telehealth: Payer: Self-pay | Admitting: Emergency Medicine

## 2021-07-05 NOTE — Telephone Encounter (Signed)
1.Medication Requested: Dulaglutide (TRULICITY) 3 ZC/0.2CH SOPN  cyclobenzaprine (FLEXERIL) 10 MG tablet  esomeprazole (NEXIUM) 40 MG capsule   2. Pharmacy (Name, Street, [place]):  [facility], [place]. Phone:  [phone]  Fax:  [phone]      3. On Med List: yes  4. Last Visit with PCP: 01.18.23  5. Next visit date with PCP: 04.12.23   Agent: Please be advised that RX refills may take up to 3 business days. We ask that you follow-up with your pharmacy.

## 2021-07-06 ENCOUNTER — Other Ambulatory Visit: Payer: Self-pay | Admitting: Emergency Medicine

## 2021-07-06 DIAGNOSIS — E119 Type 2 diabetes mellitus without complications: Secondary | ICD-10-CM

## 2021-07-06 DIAGNOSIS — R1013 Epigastric pain: Secondary | ICD-10-CM

## 2021-07-06 MED ORDER — ESOMEPRAZOLE MAGNESIUM 40 MG PO CPDR: 40.0000 mg | DELAYED_RELEASE_CAPSULE | Freq: Every day | ORAL | 2 refills | Status: DC

## 2021-07-06 MED ORDER — TRULICITY 3 MG/0.5ML ~~LOC~~ SOAJ: SUBCUTANEOUS | 0 refills | Status: DC

## 2021-07-06 MED ORDER — CYCLOBENZAPRINE HCL 10 MG PO TABS: 10.0000 mg | ORAL_TABLET | Freq: Every evening | ORAL | 0 refills | Status: DC | PRN

## 2021-07-06 NOTE — Telephone Encounter (Signed)
Patient is requesting a refill of Trulicity, Flexeril and Nexium. I do not see Flexeril on her medication list and I do not see you as the prescriber for the Nexium. Her last OV was on 06/07/21.

## 2021-07-06 NOTE — Telephone Encounter (Signed)
New prescription sent to pharmacy of record.  Thanks.

## 2021-07-17 ENCOUNTER — Other Ambulatory Visit: Payer: Self-pay | Admitting: Emergency Medicine

## 2021-07-17 ENCOUNTER — Encounter: Payer: Self-pay | Admitting: Emergency Medicine

## 2021-07-17 ENCOUNTER — Telehealth: Payer: Self-pay | Admitting: Emergency Medicine

## 2021-07-17 DIAGNOSIS — E119 Type 2 diabetes mellitus without complications: Secondary | ICD-10-CM

## 2021-07-17 DIAGNOSIS — E1165 Type 2 diabetes mellitus with hyperglycemia: Secondary | ICD-10-CM

## 2021-07-17 DIAGNOSIS — E1159 Type 2 diabetes mellitus with other circulatory complications: Secondary | ICD-10-CM

## 2021-07-17 MED ORDER — OZEMPIC (0.25 OR 0.5 MG/DOSE) 2 MG/1.5ML ~~LOC~~ SOPN: 0.5000 mg | PEN_INJECTOR | SUBCUTANEOUS | 5 refills | Status: DC

## 2021-07-17 NOTE — Telephone Encounter (Signed)
Pt states that Ozempic is the alterative to Trulicity.  FYI

## 2021-07-17 NOTE — Telephone Encounter (Signed)
Called patient and left message in reference to her medication question . Patient to call back to office

## 2021-07-17 NOTE — Telephone Encounter (Signed)
Ask patient to ask pharmacy what do they have available as a substitute.  I understand there is a Producer, television/film/video on this type of medication.  Thanks.

## 2021-07-17 NOTE — Telephone Encounter (Signed)
Patient notified that her medication Ozempic was sent to her pharmacy

## 2021-07-17 NOTE — Telephone Encounter (Signed)
Patient to call pharmacy to inquire about a substitute for Trulicity since it is on back order. Patient to call office back with this information

## 2021-07-17 NOTE — Telephone Encounter (Signed)
New prescription for Ozempic sent to pharmacy of record.  Thanks.

## 2021-07-17 NOTE — Telephone Encounter (Signed)
Pt returning call. Please call pt

## 2021-07-17 NOTE — Telephone Encounter (Signed)
Pt states she received a mssg on mychart stating Dulaglutide (TRULICITY) 3 BT/5.9RC SOPN was denied  Pt states she is confused as to why the medication is denied when it is a covered medication per her insurance  Pt requesting a c/b

## 2021-07-26 ENCOUNTER — Telehealth: Payer: Self-pay | Admitting: Emergency Medicine

## 2021-07-26 NOTE — Telephone Encounter (Signed)
Pt states pharmacy does not have Semaglutide,0.25 or 0.'5MG'$ /DOS, (OZEMPIC, 0.25 OR 0.5 MG/DOSE,) 2 MG/1.5ML SOPN in stock ? ?Pt requesting a rx for Dulaglutide (TRULICITY) 1.5 MG sent to pharmacy on file ? ?

## 2021-07-27 ENCOUNTER — Encounter: Payer: Self-pay | Admitting: Emergency Medicine

## 2021-07-27 ENCOUNTER — Other Ambulatory Visit: Payer: Self-pay

## 2021-07-27 MED ORDER — TRULICITY 1.5 MG/0.5ML ~~LOC~~ SOAJ: 1.5000 mg | SUBCUTANEOUS | 3 refills | Status: DC

## 2021-07-27 NOTE — Progress Notes (Signed)
Changed prescription to Trulicity and sent to pharmacy. ?

## 2021-07-27 NOTE — Telephone Encounter (Signed)
Trulicity is okay to prescribe.  Thanks.

## 2021-07-28 ENCOUNTER — Encounter: Payer: Self-pay | Admitting: Emergency Medicine

## 2021-07-28 MED ORDER — METHOCARBAMOL 500 MG PO TABS: 500.0000 mg | ORAL_TABLET | Freq: Two times a day (BID) | ORAL | 0 refills | Status: DC

## 2021-08-01 ENCOUNTER — Encounter: Payer: Self-pay | Admitting: Emergency Medicine

## 2021-08-01 DIAGNOSIS — E119 Type 2 diabetes mellitus without complications: Secondary | ICD-10-CM

## 2021-08-01 DIAGNOSIS — E1169 Type 2 diabetes mellitus with other specified complication: Secondary | ICD-10-CM

## 2021-08-02 MED ORDER — ATORVASTATIN CALCIUM 10 MG PO TABS: 10.0000 mg | ORAL_TABLET | Freq: Every day | ORAL | 3 refills | Status: DC

## 2021-08-02 NOTE — Telephone Encounter (Signed)
Refilled Atorvastatin.

## 2021-08-10 ENCOUNTER — Encounter: Payer: Self-pay | Admitting: Emergency Medicine

## 2021-08-10 ENCOUNTER — Ambulatory Visit (INDEPENDENT_AMBULATORY_CARE_PROVIDER_SITE_OTHER): Payer: 59 | Admitting: Emergency Medicine

## 2021-08-10 ENCOUNTER — Other Ambulatory Visit: Payer: Self-pay

## 2021-08-10 VITALS — BP 122/68 | HR 95 | Ht 63.0 in | Wt 193.0 lb

## 2021-08-10 DIAGNOSIS — E1169 Type 2 diabetes mellitus with other specified complication: Secondary | ICD-10-CM

## 2021-08-10 DIAGNOSIS — I152 Hypertension secondary to endocrine disorders: Secondary | ICD-10-CM

## 2021-08-10 DIAGNOSIS — N898 Other specified noninflammatory disorders of vagina: Secondary | ICD-10-CM

## 2021-08-10 DIAGNOSIS — E785 Hyperlipidemia, unspecified: Secondary | ICD-10-CM

## 2021-08-10 DIAGNOSIS — E1159 Type 2 diabetes mellitus with other circulatory complications: Secondary | ICD-10-CM | POA: Diagnosis not present

## 2021-08-10 DIAGNOSIS — B3731 Acute candidiasis of vulva and vagina: Secondary | ICD-10-CM | POA: Insufficient documentation

## 2021-08-10 LAB — POCT URINALYSIS DIP (MANUAL ENTRY)
Bilirubin, UA: NEGATIVE
Blood, UA: NEGATIVE
Glucose, UA: >=1000 mg/dL — AB
Ketones, POC UA: NEGATIVE mg/dL
Leukocytes, UA: NEGATIVE
Nitrite, UA: NEGATIVE
Protein Ur, POC: NEGATIVE mg/dL
Spec Grav, UA: 1.015 (ref 1.010–1.025)
Urobilinogen, UA: 0.2 E.U./dL
pH, UA: 6 (ref 5.0–8.0)

## 2021-08-10 LAB — POCT GLYCOSYLATED HEMOGLOBIN (HGB A1C): Hemoglobin A1C: 7.6 % — AB (ref 4.0–5.6)

## 2021-08-10 MED ORDER — FLUCONAZOLE 150 MG PO TABS: 150.0000 mg | ORAL_TABLET | Freq: Once | ORAL | 0 refills | Status: AC

## 2021-08-10 NOTE — Assessment & Plan Note (Signed)
Well-controlled hypertension.  Off medications. ?BP Readings from Last 3 Encounters:  ?08/10/21 122/68  ?06/07/21 118/72  ?03/01/21 128/70  ?Uncontrolled diabetes with hemoglobin A1c at 7.6. ?Continue Farxiga 10 mg daily. ?Continue Trulicity at higher dose of 3 mg weekly. ?Follow-up in 3 months. ?Diet and nutrition discussed. ? ?

## 2021-08-10 NOTE — Assessment & Plan Note (Signed)
Secondary to [place] use most likely. ?No UTI symptoms.  Normal urinalysis. ?Diflucan 150 mg daily. ?

## 2021-08-10 NOTE — Progress Notes (Signed)
Morrell Riddle ?61 y.o. ? ? ?Chief Complaint  ?Patient presents with  ? Vaginal Itching  ?  Possible yeast infection, little discharge  ? ? ?HISTORY OF PRESENT ILLNESS: ?This is a 61 y.o. female complaining of possible yeast infection. ?Diabetic on Farxiga and Trulicity.  However had to decrease Trulicity dose to 1.5 mg weekly due to medication shortage of the pharmacy.  Instead of 3 mg dose she was dispensed 1.5 mg pens. ?Lab Results  ?Component Value Date  ? HGBA1C 7.6 (A) 06/07/2021  ? ? ? ?Vaginal Itching ?Pertinent negatives include no abdominal pain, chills, diarrhea, dysuria, fever, headaches, hematuria, nausea, rash, sore throat or vomiting.  ? ? ?Prior to Admission medications   ?Medication Sig Start Date End Date Taking? Authorizing Provider  ?atorvastatin (LIPITOR) 10 MG tablet Take 1 tablet (10 mg total) by mouth daily. 08/02/21  Yes Sagardia, Ines Bloomer, MD  ?dapagliflozin propanediol (FARXIGA) 10 MG TABS tablet Take 1 tablet (10 mg total) by mouth daily before breakfast. 06/07/21 09/05/21 Yes Sagardia, Ines Bloomer, MD  ?Dulaglutide (TRULICITY) 1.5 UX/3.2GM SOPN Inject 1.5 mg into the skin once a week. 07/27/21  Yes Sagardia, Ines Bloomer, MD  ?esomeprazole (NEXIUM) 40 MG capsule Take 1 capsule (40 mg total) by mouth daily. 07/06/21  Yes Sagardia, Ines Bloomer, MD  ?linaclotide [facility]) 145 MCG CAPS capsule Take 1 capsule (145 mcg total) by mouth daily before breakfast. 03/01/21  Yes Sagardia, Ines Bloomer, MD  ?methocarbamol (ROBAXIN) 500 MG tablet Take 1 tablet (500 mg total) by mouth 2 (two) times daily. 07/28/21  Yes Sagardia, Ines Bloomer, MD  ?SUMAtriptan (IMITREX) 50 MG tablet Take 1 tablet (50 mg total) by mouth every 2 (two) hours as needed for migraine (Max 2 doses daily). May repeat in 2 hours if headache persists or recurs. 04/26/20  Yes Maximiano Coss, NP  ?blood glucose meter kit and supplies KIT Dispense based on patient and insurance preference. Use up to four times daily as directed. ?Patient  not taking: Reported on 03/01/2021 09/05/20   Horald Pollen, MD  ?blood glucose meter kit and supplies Per insurance coverage. Check blood glucose once a day. Dx E11.65 02/15/20   Jacelyn Pi, Lilia Argue, MD  ?cetirizine (ZYRTEC) 10 MG tablet Take 1 tablet (10 mg total) by mouth daily. 04/26/20   Maximiano Coss, NP  ?Continuous Blood Gluc Sensor (DEXCOM G6 SENSOR) MISC USE AS DIRECTED 4 TIMES DAILY ?Patient not taking: Reported on 08/10/2021 05/29/21   Horald Pollen, MD  ?Continuous Blood Gluc Transmit (DEXCOM G6 TRANSMITTER) MISC USE AS DIRECTED 4 TIMES DAILY ?Patient not taking: Reported on 08/10/2021 03/28/21   Horald Pollen, MD  ?cyclobenzaprine (FLEXERIL) 10 MG tablet Take 1 tablet (10 mg total) by mouth at bedtime as needed for muscle spasms. 07/06/21   Horald Pollen, MD  ? ? ?Allergies  ?Allergen Reactions  ? Orange Fruit [Citrus] Hives  ? Penicillins Hives  ? ? ?Patient Active Problem List  ? Diagnosis Date Noted  ? Chronic constipation 03/01/2021  ? Hyperlipidemia associated with type 2 diabetes mellitus ([place]) 12/30/2018  ? Seasonal allergies 04/19/2017  ? Gastroesophageal reflux disease 04/19/2017  ? Diabetes mellitus ([place]) 02/14/2012  ? Hypertension associated with diabetes ([place]) 02/14/2012  ? ? ?Past Medical History:  ?Diagnosis Date  ? Allergy   ? Anemia   ? past hx in her 7's  ? Diabetes mellitus without complication ([place])   ? GERD (gastroesophageal reflux disease)   ? Hyperlipidemia   ? Controlled   ?  Hypertension   ? Controlled   ? ? ?Past Surgical History:  ?Procedure Laterality Date  ? ABDOMINAL HYSTERECTOMY    ? PID; ovaries resected.  Age 23.  ? APPENDECTOMY    ? ? ?Social History  ? ?Socioeconomic History  ? Marital status: Divorced  ?  Spouse name: Not on file  ? Number of children: Not on file  ? Years of education: Not on file  ? Highest education level: Not on file  ?Occupational History  ? Not on file  ?Tobacco Use  ? Smoking status: Former  ?  Packs/day: 0.70  ?   Years: 30.00  ?  Pack years: 21.00  ?  Types: Cigarettes  ? Smokeless tobacco: Never  ?Vaping Use  ? Vaping Use: Never used  ?Substance and Sexual Activity  ? Alcohol use: No  ? Drug use: No  ? Sexual activity: Yes  ?  Birth control/protection: None  ?Other Topics Concern  ? Not on file  ?Social History Narrative  ? Marital status: divorced; not dating  ?    Children:  None  ?    Lives: alone  ?    Employment: Radio broadcast assistant   ?    Tobacco: sometimes; smokes 1 pack per week  ?     Alcohol:  None  ?    Drugs: none  ?    Exercise:  sporadic  ? ?Social Determinants of Health  ? ?Financial Resource Strain: Not on file  ?Food Insecurity: Not on file  ?Transportation Needs: Not on file  ?Physical Activity: Not on file  ?Stress: Not on file  ?Social Connections: Not on file  ?Intimate Partner Violence: Not on file  ? ? ?Family History  ?Problem Relation Age of Onset  ? Cancer Mother 19  ?     uterine cancer  ? Uterine cancer Mother   ? Heart disease Sister 91  ?     AMI/CAD with stenting  ? Hypertension Sister   ? Breast cancer Maternal Aunt   ? Colon cancer Neg Hx   ? Colon polyps Neg Hx   ? Esophageal cancer Neg Hx   ? Stomach cancer Neg Hx   ? Rectal cancer Neg Hx   ? ? ? ?Review of Systems  ?Constitutional: Negative.  Negative for chills and fever.  ?HENT: Negative.  Negative for congestion and sore throat.   ?Respiratory: Negative.  Negative for cough and shortness of breath.   ?Cardiovascular: Negative.  Negative for chest pain and palpitations.  ?Gastrointestinal: Negative.  Negative for abdominal pain, diarrhea, nausea and vomiting.  ?Genitourinary: Negative.  Negative for dysuria and hematuria.  ?     Vaginal itching  ?Skin: Negative.  Negative for rash.  ?Neurological:  Negative for dizziness and headaches.  ?All other systems reviewed and are negative. ? ?Today's Vitals  ? 08/10/21 1437  ?BP: 122/68  ?Pulse: 95  ?SpO2: 99%  ?Weight: 193 lb (87.5 kg)  ?Height: _0  (1.6 m)  ? ?Body mass index is 34.19  kg/m?. ?Wt Readings from Last 3 Encounters:  ?08/10/21 193 lb (87.5 kg)  ?06/07/21 197 lb (89.4 kg)  ?03/01/21 194 lb (88 kg)  ? ? ?Physical Exam ?Vitals reviewed.  ?Constitutional:   ?   Appearance: Normal appearance.  ?HENT:  ?   Head: Normocephalic.  ?Eyes:  ?   Extraocular Movements: Extraocular movements intact.  ?   Pupils: Pupils are equal, round, and reactive to light.  ?Cardiovascular:  ?   Rate and  Rhythm: Normal rate and regular rhythm.  ?   Pulses: Normal pulses.  ?   Heart sounds: Normal heart sounds.  ?Pulmonary:  ?   Effort: Pulmonary effort is normal.  ?   Breath sounds: Normal breath sounds.  ?Abdominal:  ?   Palpations: Abdomen is soft.  ?   Tenderness: There is no abdominal tenderness.  ?Musculoskeletal:  ?   Cervical back: No tenderness.  ?Lymphadenopathy:  ?   Cervical: No cervical adenopathy.  ?Skin: ?   General: Skin is warm and dry.  ?   Capillary Refill: Capillary refill takes less than 2 seconds.  ?Neurological:  ?   General: No focal deficit present.  ?   Mental Status: She is alert and oriented to person, place, and time.  ?Psychiatric:     ?   Mood and Affect: Mood normal.     ?   Behavior: Behavior normal.  ? ? ?Results for orders placed or performed in visit on 08/10/21 (from the past 24 hour(s))  ?POCT glycosylated hemoglobin (Hb A1C)     Status: Abnormal  ? Collection Time: 08/10/21  3:01 PM  ?Result Value Ref Range  ? Hemoglobin A1C 7.6 (A) 4.0 - 5.6 %  ? HbA1c POC (<> result, manual entry)    ? HbA1c, POC (prediabetic range)    ? HbA1c, POC (controlled diabetic range)    ?POCT urinalysis dipstick     Status: Abnormal  ? Collection Time: 08/10/21  3:02 PM  ?Result Value Ref Range  ? Color, UA yellow yellow  ? Clarity, UA clear clear  ? Glucose, UA >=1,000 (A) negative mg/dL  ? Bilirubin, UA negative negative  ? Ketones, POC UA negative negative mg/dL  ? Spec Grav, UA 1.015 1.010 - 1.025  ? Blood, UA negative negative  ? pH, UA 6.0 5.0 - 8.0  ? Protein Ur, POC negative negative mg/dL   ? Urobilinogen, UA 0.2 0.2 or 1.0 E.U./dL  ? Nitrite, UA Negative Negative  ? Leukocytes, UA Negative Negative  ? ? ?ASSESSMENT & PLAN: ?A total of 47 minutes was spent with the patient and counseling/coordin

## 2021-08-10 NOTE — Assessment & Plan Note (Signed)
Stable.  Diet and nutrition discussed. ?The 10-year ASCVD risk score (Arnett DK, et al., 2019) is: 9.4% ?  Values used to calculate the score: ?    Age: 61 years ?    Sex: Female ?    Is Non-Hispanic African American: Yes ?    Diabetic: Yes ?    Tobacco smoker: No ?    Systolic Blood Pressure: 326 mmHg ?    Is BP treated: No ?    HDL Cholesterol: 46.9 mg/dL ?    Total Cholesterol: 176 mg/dL ?Continue atorvastatin 10 mg daily. ?

## 2021-08-10 NOTE — Patient Instructions (Signed)
Increase Trulicity to 3 mg weekly by using two 1.5 mg pens at a time. ?Continue Farxiga. ?Diflucan 150 mg once. ?Follow-up in 3 months. ?Diabetes Mellitus and Nutrition, Adult ?When you have diabetes, or diabetes mellitus, it is very important to have healthy eating habits because your blood sugar (glucose) levels are greatly affected by what you eat and drink. Eating healthy foods in the right amounts, at about the same times every day, can help you: ?Manage your blood glucose. ?Lower your risk of heart disease. ?Improve your blood pressure. ?Reach or maintain a healthy weight. ?What can affect my meal plan? ?Every person with diabetes is different, and each person has different needs for a meal plan. Your health care provider may recommend that you work with a dietitian to make a meal plan that is best for you. Your meal plan may vary depending on factors such as: ?The calories you need. ?The medicines you take. ?Your weight. ?Your blood glucose, blood pressure, and cholesterol levels. ?Your activity level. ?Other health conditions you have, such as heart or kidney disease. ?How do carbohydrates affect me? ?Carbohydrates, also called carbs, affect your blood glucose level more than any other type of food. Eating carbs raises the amount of glucose in your blood. ?It is important to know how many carbs you can safely have in each meal. This is different for every person. Your dietitian can help you calculate how many carbs you should have at each meal and for each snack. ?How does alcohol affect me? ?Alcohol can cause a decrease in blood glucose (hypoglycemia), especially if you use insulin or take certain diabetes medicines by mouth. Hypoglycemia can be a life-threatening condition. Symptoms of hypoglycemia, such as sleepiness, dizziness, and confusion, are similar to symptoms of having too much alcohol. ?Do not drink alcohol if: ?Your health care provider tells you not to drink. ?You are pregnant, may be pregnant,  or are planning to become pregnant. ?If you drink alcohol: ?Limit how much you have to: ?0-1 drink a day for women. ?0-2 drinks a day for men. ?Know how much alcohol is in your drink. In the [place] one drink equals one 12 oz bottle of beer (355 mL), one 5 oz glass of wine (148 mL), or one 1? oz glass of hard liquor (44 mL). ?Keep yourself hydrated with water, diet soda, or unsweetened iced tea. Keep in mind that regular soda, juice, and other mixers may contain a lot of sugar and must be counted as carbs. ?What are tips for following this plan? ?Reading food labels ?Start by checking the serving size on the Nutrition Facts label of packaged foods and drinks. The number of calories and the amount of carbs, fats, and other nutrients listed on the label are based on one serving of the item. Many items contain more than one serving per package. ?Check the total grams (g) of carbs in one serving. ?Check the number of grams of saturated fats and trans fats in one serving. Choose foods that have a low amount or none of these fats. ?Check the number of milligrams (mg) of salt (sodium) in one serving. Most people should limit total sodium intake to less than 2,300 mg per day. ?Always check the nutrition information of foods labeled as "low-fat" or "nonfat." These foods may be higher in added sugar or refined carbs and should be avoided. ?Talk to your dietitian to identify your daily goals for nutrients listed on the label. ?Shopping ?Avoid buying canned, pre-made, or processed foods. These  foods tend to be high in fat, sodium, and added sugar. ?Shop around the outside edge of the grocery store. This is where you will most often find fresh fruits and vegetables, bulk grains, fresh meats, and fresh dairy products. ?Cooking ?Use low-heat cooking methods, such as baking, instead of high-heat cooking methods, such as deep frying. ?Cook using healthy oils, such as olive, canola, or sunflower oil. ?Avoid cooking with butter, cream,  or high-fat meats. ?Meal planning ?Eat meals and snacks regularly, preferably at the same times every day. Avoid going long periods of time without eating. ?Eat foods that are high in fiber, such as fresh fruits, vegetables, beans, and whole grains. ?Eat 4-6 oz (112-168 g) of lean protein each day, such as lean meat, chicken, fish, eggs, or tofu. One ounce (oz) (28 g) of lean protein is equal to: ?1 oz (28 g) of meat, chicken, or fish. ?1 egg. ?? cup (62 g) of tofu. ?Eat some foods each day that contain healthy fats, such as avocado, nuts, seeds, and fish. ?What foods should I eat? ?Fruits ?Berries. Apples. Oranges. Peaches. Apricots. Plums. Grapes. Mangoes. Papayas. Pomegranates. Kiwi. Cherries. ?Vegetables ?Leafy greens, including lettuce, spinach, kale, chard, collard greens, mustard greens, and cabbage. Beets. Cauliflower. Broccoli. Carrots. Green beans. Tomatoes. Peppers. Onions. Cucumbers. Brussels sprouts. ?Grains ?Whole grains, such as whole-wheat or whole-grain bread, crackers, tortillas, cereal, and pasta. Unsweetened oatmeal. Quinoa. Brown or wild rice. ?Meats and other proteins ?Seafood. Poultry without skin. Lean cuts of poultry and beef. Tofu. Nuts. Seeds. ?Dairy ?Low-fat or fat-free dairy products such as milk, yogurt, and cheese. ?The items listed above may not be a complete list of foods and beverages you can eat and drink. Contact a dietitian for more information. ?What foods should I avoid? ?Fruits ?Fruits canned with syrup. ?Vegetables ?Canned vegetables. Frozen vegetables with butter or cream sauce. ?Grains ?Refined white flour and flour products such as bread, pasta, snack foods, and cereals. Avoid all processed foods. ?Meats and other proteins ?Fatty cuts of meat. Poultry with skin. Breaded or fried meats. Processed meat. Avoid saturated fats. ?Dairy ?Full-fat yogurt, cheese, or milk. ?Beverages ?Sweetened drinks, such as soda or iced tea. ?The items listed above may not be a complete list of  foods and beverages you should avoid. Contact a dietitian for more information. ?Questions to ask a health care provider ?Do I need to meet with a certified diabetes care and education specialist? ?Do I need to meet with a dietitian? ?What number can I call if I have questions? ?When are the best times to check my blood glucose? ?Where to find more information: ?American Diabetes Association: diabetes.org ?Academy of Nutrition and Dietetics: eatright.org ?[employer] of Diabetes and Digestive and Kidney Diseases: AmenCredit.is ?Association of Diabetes Care & Education Specialists: diabeteseducator.org ?Summary ?It is important to have healthy eating habits because your blood sugar (glucose) levels are greatly affected by what you eat and drink. It is important to use alcohol carefully. ?A healthy meal plan will help you manage your blood glucose and lower your risk of heart disease. ?Your health care provider may recommend that you work with a dietitian to make a meal plan that is best for you. ?This information is not intended to replace advice given to you by your health care provider. Make sure you discuss any questions you have with your health care provider. ?Document Revised: 12/09/2019 Document Reviewed: 12/09/2019 ?Elsevier Patient Education ? [place]. ? ?

## 2021-08-23 ENCOUNTER — Other Ambulatory Visit: Payer: Self-pay | Admitting: Registered Nurse

## 2021-08-23 DIAGNOSIS — J302 Other seasonal allergic rhinitis: Secondary | ICD-10-CM

## 2021-08-30 ENCOUNTER — Ambulatory Visit (INDEPENDENT_AMBULATORY_CARE_PROVIDER_SITE_OTHER): Payer: Managed Care, Other (non HMO)

## 2021-08-30 ENCOUNTER — Ambulatory Visit (HOSPITAL_COMMUNITY)
Admission: EM | Admit: 2021-08-30 | Discharge: 2021-08-30 | Disposition: A | Discharge status: Home / Self Care | Payer: Managed Care, Other (non HMO) | Attending: Family Medicine | Admitting: Family Medicine

## 2021-08-30 ENCOUNTER — Encounter (HOSPITAL_COMMUNITY): Payer: Self-pay

## 2021-08-30 ENCOUNTER — Ambulatory Visit: Payer: 59 | Admitting: Emergency Medicine

## 2021-08-30 ENCOUNTER — Encounter: Payer: Self-pay | Admitting: Emergency Medicine

## 2021-08-30 DIAGNOSIS — M79671 Pain in right foot: Secondary | ICD-10-CM

## 2021-08-30 MED ORDER — KETOROLAC TROMETHAMINE 30 MG/ML IJ SOLN
INTRAMUSCULAR | Status: AC
  Filled 2021-08-30: qty 1

## 2021-08-30 MED ORDER — KETOROLAC TROMETHAMINE 30 MG/ML IJ SOLN
30.0000 mg | Freq: Once | INTRAMUSCULAR | Status: AC
  Administered 2021-08-30: 30 mg via INTRAMUSCULAR

## 2021-08-30 NOTE — ED Triage Notes (Signed)
Pt states she fell earlier today and fell on the right foot. Pt states no pain medication taken today. Pt presents with limited ROM. Pt reports pain when applying pressure. ?

## 2021-08-30 NOTE — ED Provider Notes (Signed)
?[facility] ? ? ? ?CSN: [id number] ?Arrival date & time: 08/30/21  1715 ? ? ?  ? ?History   ?Chief Complaint ?Chief Complaint  ?Patient presents with  ? Foot Pain  ?  I fell need X-rays on right foot - Entered by patient  ? ? ?HPI ?Karen [name] is a 61 y.o. female.  ? ? ?Foot Pain ? ?Here for right foot pain. [address] as she stepped off a curb today, onto her right foot. Hurts to bear weight. No blow to head and no LOC. She did fall onto her right arm, just bruised. ? ?Past Medical History:  ?Diagnosis Date  ? Allergy   ? Anemia   ? past hx in her 47's  ? Diabetes mellitus without complication ([place])   ? GERD (gastroesophageal reflux disease)   ? Hyperlipidemia   ? Controlled   ? Hypertension   ? Controlled   ? ? ?Patient Active Problem List  ? Diagnosis Date Noted  ? Yeast vaginitis 08/10/2021  ? Chronic constipation 03/01/2021  ? Hyperlipidemia associated with type 2 diabetes mellitus ([place]) 12/30/2018  ? Seasonal allergies 04/19/2017  ? Gastroesophageal reflux disease 04/19/2017  ? Diabetes mellitus ([place]) 02/14/2012  ? Hypertension associated with diabetes ([place]) 02/14/2012  ? ? ?Past Surgical History:  ?Procedure Laterality Date  ? ABDOMINAL HYSTERECTOMY    ? PID; ovaries resected.  Age 22.  ? APPENDECTOMY    ? ? ?OB History   ?No obstetric history on file. ?  ? ? ? ?Home Medications   ? ?Prior to Admission medications   ?Medication Sig Start Date End Date Taking? Authorizing Provider  ?atorvastatin (LIPITOR) 10 MG tablet Take 1 tablet (10 mg total) by mouth daily. 08/02/21   Horald Pollen, MD  ?blood glucose meter kit and supplies KIT Dispense based on patient and insurance preference. Use up to four times daily as directed. ?Patient not taking: Reported on 03/01/2021 09/05/20   Horald Pollen, MD  ?blood glucose meter kit and supplies Per insurance coverage. Check blood glucose once a day. Dx E11.65 02/15/20   Jacelyn Pi, Lilia Argue, MD  ?cetirizine (ZYRTEC) 10 MG tablet Take 1 tablet by  mouth once daily 08/23/21   Maximiano Coss, NP  ?Continuous Blood Gluc Sensor (DEXCOM G6 SENSOR) MISC USE AS DIRECTED 4 TIMES DAILY ?Patient not taking: Reported on 08/10/2021 05/29/21   Horald Pollen, MD  ?Continuous Blood Gluc Transmit (DEXCOM G6 TRANSMITTER) MISC USE AS DIRECTED 4 TIMES DAILY ?Patient not taking: Reported on 08/10/2021 03/28/21   Horald Pollen, MD  ?cyclobenzaprine (FLEXERIL) 10 MG tablet Take 1 tablet (10 mg total) by mouth at bedtime as needed for muscle spasms. 07/06/21   Horald Pollen, MD  ?dapagliflozin propanediol (FARXIGA) 10 MG TABS tablet Take 1 tablet (10 mg total) by mouth daily before breakfast. 06/07/21 09/05/21  Horald Pollen, MD  ?Dulaglutide (TRULICITY) 1.5 KK/9.3GH SOPN Inject 1.5 mg into the skin once a week. 07/27/21   Horald Pollen, MD  ?esomeprazole (NEXIUM) 40 MG capsule Take 1 capsule (40 mg total) by mouth daily. 07/06/21   Horald Pollen, MD  ?linaclotide [facility]) 145 MCG CAPS capsule Take 1 capsule (145 mcg total) by mouth daily before breakfast. 03/01/21   Horald Pollen, MD  ?methocarbamol (ROBAXIN) 500 MG tablet Take 1 tablet (500 mg total) by mouth 2 (two) times daily. 07/28/21   Horald Pollen, MD  ?SUMAtriptan (IMITREX) 50 MG tablet Take 1 tablet (50 mg  total) by mouth every 2 (two) hours as needed for migraine (Max 2 doses daily). May repeat in 2 hours if headache persists or recurs. 04/26/20   Maximiano Coss, NP  ? ? ?Family History ?Family History  ?Problem Relation Age of Onset  ? Cancer Mother 41  ?     uterine cancer  ? Uterine cancer Mother   ? Heart disease Sister 71  ?     AMI/CAD with stenting  ? Hypertension Sister   ? Breast cancer Maternal Aunt   ? Colon cancer Neg Hx   ? Colon polyps Neg Hx   ? Esophageal cancer Neg Hx   ? Stomach cancer Neg Hx   ? Rectal cancer Neg Hx   ? ? ?Social History ?Social History  ? ?Tobacco Use  ? Smoking status: Former  ?  Packs/day: 0.70  ?  Years: 30.00  ?  Pack years:  21.00  ?  Types: Cigarettes  ? Smokeless tobacco: Never  ?Vaping Use  ? Vaping Use: Never used  ?Substance Use Topics  ? Alcohol use: No  ? Drug use: No  ? ? ? ?Allergies   ?Orange fruit [citrus] and Penicillins ? ? ?Review of Systems ?Review of Systems ? ? ?Physical Exam ?Triage Vital Signs ?ED Triage Vitals  ?Enc Vitals Group  ?   BP 08/30/21 1907 139/84  ?   Pulse Rate 08/30/21 1907 88  ?   Resp 08/30/21 1907 18  ?   Temp 08/30/21 1907 98.5 ?F (36.9 ?C)  ?   Temp Source 08/30/21 1907 Oral  ?   SpO2 08/30/21 1907 99 %  ?   Weight --   ?   Height --   ?   Head Circumference --   ?   Peak Flow --   ?   Pain Score 08/30/21 1905 7  ?   Pain Loc --   ?   Pain Edu? --   ?   Excl. in [place]? --   ? ?No data found. ? ?Updated Vital Signs ?BP 139/84 (BP Location: Left Arm)   Pulse 88   Temp 98.5 ?F (36.9 ?C) (Oral)   Resp 18   SpO2 99%  ? ?Visual Acuity ?Right Eye Distance:   ?Left Eye Distance:   ?Bilateral Distance:   ? ?Right Eye Near:   ?Left Eye Near:    ?Bilateral Near:    ? ?Physical Exam ?Vitals reviewed.  ?Constitutional:   ?   General: She is not in acute distress. ?   Appearance: She is not toxic-appearing.  ?Musculoskeletal:  ?   Comments: There is some edema over the lateral right foot anterior and inferior to the lateral malleolus. Malleoli non tender. Also some tenderness of the dorsal forefoot. No deformity  ?Skin: ?   Capillary Refill: Capillary refill takes less than 2 seconds.  ?Neurological:  ?   General: No focal deficit present.  ?   Mental Status: She is alert.  ?Psychiatric:     ?   Behavior: Behavior normal.  ? ? ? ?UC Treatments / Results  ?Labs ?(all labs ordered are listed, but only abnormal results are displayed) ?Labs Reviewed - No data to display ? ?EKG ? ? ?Radiology ?DG Foot Complete Right ? ?Result Date: 08/30/2021 ?CLINICAL DATA:  right lateral and forefoot pain after fall off a curb today EXAM: RIGHT FOOT COMPLETE - 3+ VIEW COMPARISON:  None. FINDINGS: There is no evidence of fracture or  dislocation. Trace plantar calcaneal spur.  There is no evidence of arthropathy or other focal bone abnormality. Soft tissues are unremarkable. IMPRESSION: No acute displaced fracture or dislocation. Electronically Signed   By: Iven Finn M.D.   On: 08/30/2021 19:55   ? ?Procedures ?Procedures (including critical care time) ? ?Medications Ordered in UC ?Medications  ?ketorolac (TORADOL) 30 MG/ML injection 30 mg (has no administration in time range)  ? ? ?Initial Impression / Assessment and Plan / UC Course  ?I have reviewed the triage vital signs and the nursing notes. ? ?Pertinent labs & imaging results that were available during my care of the patient were reviewed by me and considered in my medical decision making (see chart for details). ? ?  ? ?Xray is negative. Will treat with ibuprofen and have her ice and elevate. She actually sees her PCP tomorrow for routine f/u ? ?eGFR nl at last lab draw in Epic ?Final Clinical Impressions(s) / UC Diagnoses  ? ?Final diagnoses:  ?Foot pain, right  ? ? ? ?Discharge Instructions   ? ?  ?The xray reading did not show any fracture. ? ?You have been given a shot of toradol 30 mg ? ?Take ibuprofen 800 mg--1 tab every 8 hours as needed for pain.  ? ?Ice and elevate your foot tonight and tomorrow. ? ? ? ? ?ED Prescriptions   ?None ?  ? ?PDMP not reviewed this encounter. ?  ?Barrett Henle, MD ?08/30/21 2005 ? ?

## 2021-08-30 NOTE — Discharge Instructions (Addendum)
The xray reading did not show any fracture. ? ?You have been given a shot of toradol 30 mg ? ?Take ibuprofen 800 mg--1 tab every 8 hours as needed for pain.  ? ?Ice and elevate your foot tonight and tomorrow. ?

## 2021-08-31 ENCOUNTER — Ambulatory Visit: Payer: 59 | Admitting: Family Medicine

## 2021-09-05 ENCOUNTER — Ambulatory Visit: Payer: 59 | Admitting: Emergency Medicine

## 2021-10-18 MED ORDER — TRULICITY 1.5 MG/0.5ML ~~LOC~~ SOAJ: 1.5000 mg | SUBCUTANEOUS | 3 refills | Status: DC

## 2021-10-20 ENCOUNTER — Other Ambulatory Visit: Payer: Self-pay | Admitting: Emergency Medicine

## 2021-11-16 ENCOUNTER — Ambulatory Visit: Payer: 59 | Admitting: Emergency Medicine

## 2021-11-30 ENCOUNTER — Ambulatory Visit: Payer: 59 | Admitting: Emergency Medicine

## 2021-12-05 ENCOUNTER — Encounter: Payer: Self-pay | Admitting: Emergency Medicine

## 2021-12-05 ENCOUNTER — Ambulatory Visit (INDEPENDENT_AMBULATORY_CARE_PROVIDER_SITE_OTHER): Payer: 59 | Admitting: Emergency Medicine

## 2021-12-05 VITALS — BP 120/84 | HR 102 | Temp 98.5°F | Ht 63.0 in | Wt 197.0 lb

## 2021-12-05 DIAGNOSIS — E1159 Type 2 diabetes mellitus with other circulatory complications: Secondary | ICD-10-CM | POA: Diagnosis not present

## 2021-12-05 DIAGNOSIS — E1169 Type 2 diabetes mellitus with other specified complication: Secondary | ICD-10-CM

## 2021-12-05 DIAGNOSIS — E119 Type 2 diabetes mellitus without complications: Secondary | ICD-10-CM | POA: Diagnosis not present

## 2021-12-05 DIAGNOSIS — E785 Hyperlipidemia, unspecified: Secondary | ICD-10-CM

## 2021-12-05 DIAGNOSIS — I152 Hypertension secondary to endocrine disorders: Secondary | ICD-10-CM

## 2021-12-05 LAB — POCT GLYCOSYLATED HEMOGLOBIN (HGB A1C): Hemoglobin A1C: 8.1 % — AB (ref 4.0–5.6)

## 2021-12-05 MED ORDER — TIRZEPATIDE 5 MG/0.5ML ~~LOC~~ SOAJ: 5.0000 mg | SUBCUTANEOUS | 3 refills | Status: DC

## 2021-12-05 NOTE — Assessment & Plan Note (Signed)
Diet and nutrition discussed. Continue atorvastatin 10 mg daily. The 10-year ASCVD risk score (Arnett DK, et al., 2019) is: 9%   Values used to calculate the score:     Age: 61 years     Sex: Female     Is Non-Hispanic African American: Yes     Diabetic: Yes     Tobacco smoker: No     Systolic Blood Pressure: 961 mmHg     Is BP treated: No     HDL Cholesterol: 46.9 mg/dL     Total Cholesterol: 176 mg/dL

## 2021-12-05 NOTE — Progress Notes (Signed)
Lab Results  Component Value Date   HGBA1C 7.6 (A) 08/10/2021   Karen [name] 61 y.o.   Chief Complaint  Patient presents with   Follow-up    3 mnth f/u appt    Diabetes    Pt wants to change the Trulicity to something else    HISTORY OF PRESENT ILLNESS: This is a 61 y.o. female here for 64-monthfollow-up of diabetes. Complaining she is not getting the Trulicity 3 mg dose pens due to shortage. Instead has been using Trulicity 1.5 mg weekly. No other complaints or medical concerns today.   HPI   Prior to Admission medications   Medication Sig Start Date End Date Taking? Authorizing Provider  atorvastatin (LIPITOR) 10 MG tablet Take 1 tablet (10 mg total) by mouth daily. 08/02/21  Yes Sagardia, MInes Bloomer MD  cetirizine (ZYRTEC) 10 MG tablet Take 1 tablet by mouth once daily 08/23/21  Yes MMaximiano Coss NP  cyclobenzaprine (FLEXERIL) 10 MG tablet TAKE 1 TABLET BY MOUTH AT BEDTIME AS NEEDED FOR MUSCLE SPASM 10/20/21  Yes SHorald Pollen MD  esomeprazole (NEXIUM) 40 MG capsule Take 1 capsule (40 mg total) by mouth daily. 07/06/21  Yes Sagardia, MInes Bloomer MD  methocarbamol (ROBAXIN) 500 MG tablet Take 1 tablet (500 mg total) by mouth 2 (two) times daily. 07/28/21  Yes Sagardia, MInes Bloomer MD  SUMAtriptan (IMITREX) 50 MG tablet Take 1 tablet (50 mg total) by mouth every 2 (two) hours as needed for migraine (Max 2 doses daily). May repeat in 2 hours if headache persists or recurs. 04/26/20  Yes MMaximiano Coss NP  tirzepatide ([facility] 5 MG/0.5ML Pen Inject 5 mg into the skin once a week. 12/05/21  Yes Sagardia, MInes Bloomer MD  blood glucose meter kit and supplies KIT Dispense based on patient and insurance preference. Use up to four times daily as directed. Patient not taking: Reported on 03/01/2021 09/05/20   SHorald Pollen MD  blood glucose meter kit and supplies Per insurance coverage. Check blood glucose once a day. Dx E11.65 Patient not taking: Reported on  12/05/2021 02/15/20   SJacelyn Pi ILilia Argue MD  Continuous Blood Gluc Sensor (DEXCOM G6 SENSOR) MISC USE AS DIRECTED 4 TIMES DAILY Patient not taking: Reported on 08/10/2021 05/29/21   SHorald Pollen MD  Continuous Blood Gluc Transmit (DConverse MSubletteUSE AS DIRECTED 4 TIMES DAILY Patient not taking: Reported on 08/10/2021 03/28/21   SHorald Pollen MD  linaclotide ([facility] 145 MCG CAPS capsule Take 1 capsule (145 mcg total) by mouth daily before breakfast. 03/01/21   SHorald Pollen MD    Allergies  Allergen Reactions   Orange Fruit [Citrus] Hives   Penicillins Hives    Patient Active Problem List   Diagnosis Date Noted   Yeast vaginitis 08/10/2021   Chronic constipation 03/01/2021   Hyperlipidemia associated with type 2 diabetes mellitus ([place] 12/30/2018   Seasonal allergies 04/19/2017   Gastroesophageal reflux disease 04/19/2017   Diabetes mellitus ([place] 02/14/2012   Hypertension associated with diabetes ([place] 02/14/2012    Past Medical History:  Diagnosis Date   Allergy    Anemia    past hx in her 20's   Diabetes mellitus without complication (HCC)    GERD (gastroesophageal reflux disease)    Hyperlipidemia    Controlled    Hypertension    Controlled     Past Surgical History:  Procedure Laterality Date   ABDOMINAL HYSTERECTOMY     PID; ovaries resected.  Age 48.   APPENDECTOMY      Social History   Socioeconomic History   Marital status: Divorced    Spouse name: Not on file   Number of children: Not on file   Years of education: Not on file   Highest education level: Not on file  Occupational History   Not on file  Tobacco Use   Smoking status: Former    Packs/day: 0.70    Years: 30.00    Total pack years: 21.00    Types: Cigarettes   Smokeless tobacco: Never  Vaping Use   Vaping Use: Never used  Substance and Sexual Activity   Alcohol use: No   Drug use: No   Sexual activity: Yes    Birth control/protection: None   Other Topics Concern   Not on file  Social History Narrative   Marital status: divorced; not dating      Children:  None      Lives: alone      Employment: Radio broadcast assistant       Tobacco: sometimes; smokes 1 pack per week       Alcohol:  None      Drugs: none      Exercise:  sporadic   Social Determinants of Radio broadcast assistant Strain: Not on file  Food Insecurity: Not on file  Transportation Needs: Not on file  Physical Activity: Not on file  Stress: Not on file  Social Connections: Not on file  Intimate Partner Violence: Not on file    Family History  Problem Relation Age of Onset   Cancer Mother 68       uterine cancer   Uterine cancer Mother    Heart disease Sister 22       AMI/CAD with stenting   Hypertension Sister    Breast cancer Maternal Aunt    Colon cancer Neg Hx    Colon polyps Neg Hx    Esophageal cancer Neg Hx    Stomach cancer Neg Hx    Rectal cancer Neg Hx      Review of Systems  Constitutional: Negative.  Negative for chills and fever.  HENT: Negative.  Negative for congestion and sore throat.   Respiratory: Negative.  Negative for cough and shortness of breath.   Cardiovascular: Negative.  Negative for chest pain and palpitations.  Gastrointestinal:  Negative for abdominal pain, diarrhea, heartburn, nausea and vomiting.  Genitourinary: Negative.   Skin: Negative.  Negative for rash.  Neurological: Negative.  Negative for dizziness and headaches.  All other systems reviewed and are negative.  Today's Vitals   12/05/21 1437  BP: 120/84  Pulse: (!) 102  Temp: 98.5 F (36.9 C)  TempSrc: Oral  SpO2: 91%  Weight: 197 lb (89.4 kg)  Height: 5' 3" (1.6 m)   Body mass index is 34.9 kg/m. Wt Readings from Last 3 Encounters:  12/05/21 197 lb (89.4 kg)  08/10/21 193 lb (87.5 kg)  06/07/21 197 lb (89.4 kg)     Physical Exam Vitals reviewed.  Constitutional:      Appearance: Normal appearance.  HENT:     Head: Normocephalic.      Mouth/Throat:     Mouth: Mucous membranes are moist.     Pharynx: Oropharynx is clear.  Eyes:     Extraocular Movements: Extraocular movements intact.     Pupils: Pupils are equal, round, and reactive to light.  Cardiovascular:     Rate and Rhythm: Normal rate and regular rhythm.  Pulses: Normal pulses.     Heart sounds: Normal heart sounds.  Pulmonary:     Effort: Pulmonary effort is normal.     Breath sounds: Normal breath sounds.  Musculoskeletal:     Cervical back: No tenderness.  Lymphadenopathy:     Cervical: No cervical adenopathy.  Skin:    General: Skin is warm and dry.     Capillary Refill: Capillary refill takes less than 2 seconds.  Neurological:     General: No focal deficit present.     Mental Status: She is alert and oriented to person, place, and time.  Psychiatric:        Mood and Affect: Mood normal.        Behavior: Behavior normal.     Results for orders placed or performed in visit on 12/05/21 (from the past 24 hour(s))  POCT HgB A1C     Status: Abnormal   Collection Time: 12/05/21  3:01 PM  Result Value Ref Range   Hemoglobin A1C 8.1 (A) 4.0 - 5.6 %   HbA1c POC (<> result, manual entry)     HbA1c, POC (prediabetic range)     HbA1c, POC (controlled diabetic range)      ASSESSMENT & PLAN: A total of 48 minutes was spent with the patient and counseling/coordination of care regarding preparing for this visit, review of most recent office visit notes, review of most recent blood work results including today's hemoglobin A1c, review of all medications and changes made, cardiovascular risks associated with hypertension and uncontrolled diabetes, education on nutrition, prognosis, documentation and need for follow-up.  Problem List Items Addressed This Visit       Cardiovascular and Mediastinum   Hypertension associated with diabetes ([place]) - Primary    Well-controlled hypertension off medications. Uncontrolled diabetes with hemoglobin A1c at  8.1. Continue Farxiga 10 mg daily, discontinue Trulicity, and start Mounjaro 5 mg weekly. Diet and nutrition discussed. Cardiovascular risk associated with uncontrolled diabetes discussed. Follow-up in 3 months.      Relevant Medications   tirzepatide (MOUNJARO) 5 MG/0.5ML Pen   dapagliflozin propanediol (FARXIGA) 10 MG TABS tablet     Endocrine   Diabetes mellitus (HCC)   Relevant Medications   tirzepatide (MOUNJARO) 5 MG/0.5ML Pen   dapagliflozin propanediol (FARXIGA) 10 MG TABS tablet   Other Relevant Orders   POCT HgB A1C (Completed)   Hyperlipidemia associated with type 2 diabetes mellitus ([place])    Diet and nutrition discussed. Continue atorvastatin 10 mg daily. The 10-year ASCVD risk score (Arnett DK, et al., 2019) is: 9%   Values used to calculate the score:     Age: 87 years     Sex: Female     Is Non-Hispanic African American: Yes     Diabetic: Yes     Tobacco smoker: No     Systolic Blood Pressure: 742 mmHg     Is BP treated: No     HDL Cholesterol: 46.9 mg/dL     Total Cholesterol: 176 mg/dL       Relevant Medications   tirzepatide (MOUNJARO) 5 MG/0.5ML Pen   dapagliflozin propanediol (FARXIGA) 10 MG TABS tablet   Patient Instructions  Diabetes Mellitus and Nutrition, Adult When you have diabetes, or diabetes mellitus, it is very important to have healthy eating habits because your blood sugar (glucose) levels are greatly affected by what you eat and drink. Eating healthy foods in the right amounts, at about the same times every day, can help you: Manage your blood  glucose. Lower your risk of heart disease. Improve your blood pressure. Reach or maintain a healthy weight. What can affect my meal plan? Every person with diabetes is different, and each person has different needs for a meal plan. Your health care provider may recommend that you work with a dietitian to make a meal plan that is best for you. Your meal plan may vary depending on factors such  as: The calories you need. The medicines you take. Your weight. Your blood glucose, blood pressure, and cholesterol levels. Your activity level. Other health conditions you have, such as heart or kidney disease. How do carbohydrates affect me? Carbohydrates, also called carbs, affect your blood glucose level more than any other type of food. Eating carbs raises the amount of glucose in your blood. It is important to know how many carbs you can safely have in each meal. This is different for every person. Your dietitian can help you calculate how many carbs you should have at each meal and for each snack. How does alcohol affect me? Alcohol can cause a decrease in blood glucose (hypoglycemia), especially if you use insulin or take certain diabetes medicines by mouth. Hypoglycemia can be a life-threatening condition. Symptoms of hypoglycemia, such as sleepiness, dizziness, and confusion, are similar to symptoms of having too much alcohol. Do not drink alcohol if: Your health care provider tells you not to drink. You are pregnant, may be pregnant, or are planning to become pregnant. If you drink alcohol: Limit how much you have to: 0-1 drink a day for women. 0-2 drinks a day for men. Know how much alcohol is in your drink. In the [place] one drink equals one 12 oz bottle of beer (355 mL), one 5 oz glass of wine (148 mL), or one 1 oz glass of hard liquor (44 mL). Keep yourself hydrated with water, diet soda, or unsweetened iced tea. Keep in mind that regular soda, juice, and other mixers may contain a lot of sugar and must be counted as carbs. What are tips for following this plan?  Reading food labels Start by checking the serving size on the Nutrition Facts label of packaged foods and drinks. The number of calories and the amount of carbs, fats, and other nutrients listed on the label are based on one serving of the item. Many items contain more than one serving per package. Check the total  grams (g) of carbs in one serving. Check the number of grams of saturated fats and trans fats in one serving. Choose foods that have a low amount or none of these fats. Check the number of milligrams (mg) of salt (sodium) in one serving. Most people should limit total sodium intake to less than 2,300 mg per day. Always check the nutrition information of foods labeled as "low-fat" or "nonfat." These foods may be higher in added sugar or refined carbs and should be avoided. Talk to your dietitian to identify your daily goals for nutrients listed on the label. Shopping Avoid buying canned, pre-made, or processed foods. These foods tend to be high in fat, sodium, and added sugar. Shop around the outside edge of the grocery store. This is where you will most often find fresh fruits and vegetables, bulk grains, fresh meats, and fresh dairy products. Cooking Use low-heat cooking methods, such as baking, instead of high-heat cooking methods, such as deep frying. Cook using healthy oils, such as olive, canola, or sunflower oil. Avoid cooking with butter, cream, or high-fat meats. Meal planning Eat  meals and snacks regularly, preferably at the same times every day. Avoid going long periods of time without eating. Eat foods that are high in fiber, such as fresh fruits, vegetables, beans, and whole grains. Eat 4-6 oz (112-168 g) of lean protein each day, such as lean meat, chicken, fish, eggs, or tofu. One ounce (oz) (28 g) of lean protein is equal to: 1 oz (28 g) of meat, chicken, or fish. 1 egg.  cup (62 g) of tofu. Eat some foods each day that contain healthy fats, such as avocado, nuts, seeds, and fish. What foods should I eat? Fruits Berries. Apples. Oranges. Peaches. Apricots. Plums. Grapes. Mangoes. Papayas. Pomegranates. Kiwi. Cherries. Vegetables Leafy greens, including lettuce, spinach, kale, chard, collard greens, mustard greens, and cabbage. Beets. Cauliflower. Broccoli. Carrots. Green  beans. Tomatoes. Peppers. Onions. Cucumbers. Brussels sprouts. Grains Whole grains, such as whole-wheat or whole-grain bread, crackers, tortillas, cereal, and pasta. Unsweetened oatmeal. Quinoa. Brown or wild rice. Meats and other proteins Seafood. Poultry without skin. Lean cuts of poultry and beef. Tofu. Nuts. Seeds. Dairy Low-fat or fat-free dairy products such as milk, yogurt, and cheese. The items listed above may not be a complete list of foods and beverages you can eat and drink. Contact a dietitian for more information. What foods should I avoid? Fruits Fruits canned with syrup. Vegetables Canned vegetables. Frozen vegetables with butter or cream sauce. Grains Refined white flour and flour products such as bread, pasta, snack foods, and cereals. Avoid all processed foods. Meats and other proteins Fatty cuts of meat. Poultry with skin. Breaded or fried meats. Processed meat. Avoid saturated fats. Dairy Full-fat yogurt, cheese, or milk. Beverages Sweetened drinks, such as soda or iced tea. The items listed above may not be a complete list of foods and beverages you should avoid. Contact a dietitian for more information. Questions to ask a health care provider Do I need to meet with a certified diabetes care and education specialist? Do I need to meet with a dietitian? What number can I call if I have questions? When are the best times to check my blood glucose? Where to find more information: American Diabetes Association: diabetes.org Academy of Nutrition and Dietetics: eatright.[employer] of Diabetes and Digestive and Kidney Diseases: Amen[employer].is Association of Diabetes Care & Education Specialists: diabeteseducator.org Summary It is important to have healthy eating habits because your blood sugar (glucose) levels are greatly affected by what you eat and drink. It is important to use alcohol carefully. A healthy meal plan will help you manage your blood glucose  and lower your risk of heart disease. Your health care provider may recommend that you work with a dietitian to make a meal plan that is best for you. This information is not intended to replace advice given to you by your health care provider. Make sure you discuss any questions you have with your health care provider. Document Revised: 12/09/2019 Document Reviewed: 12/09/2019 Elsevier Patient Education  [place], MD [facility]

## 2021-12-05 NOTE — Patient Instructions (Signed)

## 2021-12-05 NOTE — Assessment & Plan Note (Signed)
Well-controlled hypertension off medications. Uncontrolled diabetes with hemoglobin A1c at 8.1. Continue Farxiga 10 mg daily, discontinue Trulicity, and start Mounjaro 5 mg weekly. Diet and nutrition discussed. Cardiovascular risk associated with uncontrolled diabetes discussed. Follow-up in 3 months.

## 2021-12-06 ENCOUNTER — Telehealth: Payer: Self-pay | Admitting: *Deleted

## 2021-12-06 ENCOUNTER — Encounter: Payer: Self-pay | Admitting: Emergency Medicine

## 2021-12-06 NOTE — Telephone Encounter (Signed)
She is waiting on insurance w/ prior auth. Was submitted this am../lmb

## 2021-12-06 NOTE — Telephone Encounter (Signed)
Duplicate email.Marland Kitchen pt informed PA was process waiting on insurance.Marland KitchenJohny Chess

## 2021-12-06 NOTE — Telephone Encounter (Signed)
We will wait on that then. Thanks.

## 2021-12-06 NOTE — Telephone Encounter (Signed)
Karen [name] was prescribed Mounjaro yesterday instead.  Did Karen [name] get this medication?  Does Karen [name] want to instead take Trulicity?

## 2021-12-06 NOTE — Telephone Encounter (Signed)
Pt was on cover-my-meds need PA on Mounjaro. Submitted w/ (Key: [id number]). Rec'd msg Your information has been submitted and will be reviewed by [place]...Johny Chess

## 2021-12-15 ENCOUNTER — Other Ambulatory Visit: Payer: Self-pay | Admitting: Emergency Medicine

## 2021-12-15 MED ORDER — TRULICITY 3 MG/0.5ML ~~LOC~~ SOAJ: 3.0000 mg | SUBCUTANEOUS | 3 refills | Status: DC

## 2021-12-15 NOTE — Telephone Encounter (Signed)
New prescription for Trulicity sent to pharmacy of record.  Thanks.

## 2021-12-15 NOTE — Telephone Encounter (Signed)
Pt was advised that Karen [name] was not covered by her insurance and she is requesting a refill for Trulicity '3mg'$ .  Please advise

## 2021-12-18 NOTE — Telephone Encounter (Signed)
They state Trulicity is covered by her insurance.  She should then be on Trulicity.  Thanks.

## 2021-12-18 NOTE — Telephone Encounter (Signed)
Rec'd determination med has been DENIED It states... There is nothing to support that the individual has contraindication, or is intolerant to one of the  following covered alternatives:  A. Byetta/Bydureon [may require prior authorization]; and B. Trulicity  [may require prior authorization]. There is no indication that your patient has met both of the following: A) Individual will continue maximally tolerated metformin therapy, if not contraindicated, intolerant, or otherwise not a candidate; and B) Documentation of one of the following: 1. Unable to achieve goal HbA1C despite  metformin or metformin-containing regimen (meglitinides, sulfonylureas, or thiazolidinediones) at  greater than or equal to 1,500 mg per day; 2. Intolerance to metformin 1,500 mg per day despite  appropriate dose titration duration (for example, period of 8-12 weeks); 3. Contraindication to  metformin per FDA label (for example, acute/chronic metabolic acidosis, severe renal dysfunction);  4. Not a candidate for metformin (for example, hepatic impairment, moderate renal dysfunction,  unstable heart failure, individual is using an agent for a non-diabetic FDA-approved indication); 5.  Initial metformin combination therapy is clinically appropriate for elevated HbA1C (for example,  greater than 1.5% above goal); or 6. Initial metformin combination therapy is clinically appropriate  in an individual with co-morbid conditions (such as ASCVD, heart failure, or CKD). Darcel Bayley is considered medically necessary when all of the following are met: 1. Documented  diagnosis of Type 2 diabetes mellitus; 2. Both of the following: A. Documentation of one the  following: i. Unable to achieve goal HbA1C despite metformin or metformin-containing regimen

## 2022-01-16 ENCOUNTER — Other Ambulatory Visit: Payer: Self-pay | Admitting: Emergency Medicine

## 2022-01-16 DIAGNOSIS — Z1231 Encounter for screening mammogram for malignant neoplasm of breast: Secondary | ICD-10-CM

## 2022-01-27 LAB — HM DIABETES EYE EXAM

## 2022-01-30 ENCOUNTER — Other Ambulatory Visit: Payer: Self-pay | Admitting: Emergency Medicine

## 2022-01-30 MED ORDER — CYCLOBENZAPRINE HCL 10 MG PO TABS: 10.0000 mg | ORAL_TABLET | Freq: Three times a day (TID) | ORAL | 0 refills | Status: DC | PRN

## 2022-02-06 ENCOUNTER — Ambulatory Visit: Payer: 59

## 2022-02-27 ENCOUNTER — Ambulatory Visit
Admission: RE | Admit: 2022-02-27 | Discharge: 2022-02-27 | Disposition: A | Discharge status: Home / Self Care | Payer: Commercial Managed Care - HMO | Source: Ambulatory Visit | Attending: Emergency Medicine | Admitting: Emergency Medicine

## 2022-02-27 DIAGNOSIS — Z1231 Encounter for screening mammogram for malignant neoplasm of breast: Secondary | ICD-10-CM

## 2022-02-28 ENCOUNTER — Encounter: Payer: Self-pay | Admitting: Emergency Medicine

## 2022-03-08 ENCOUNTER — Ambulatory Visit: Payer: Commercial Managed Care - HMO | Admitting: Emergency Medicine

## 2022-03-08 ENCOUNTER — Encounter: Payer: Self-pay | Admitting: Emergency Medicine

## 2022-03-08 VITALS — BP 130/74 | HR 95 | Temp 98.7°F | Ht 63.0 in | Wt 189.5 lb

## 2022-03-08 DIAGNOSIS — J302 Other seasonal allergic rhinitis: Secondary | ICD-10-CM

## 2022-03-08 DIAGNOSIS — E1159 Type 2 diabetes mellitus with other circulatory complications: Secondary | ICD-10-CM

## 2022-03-08 DIAGNOSIS — I152 Hypertension secondary to endocrine disorders: Secondary | ICD-10-CM

## 2022-03-08 DIAGNOSIS — E119 Type 2 diabetes mellitus without complications: Secondary | ICD-10-CM

## 2022-03-08 DIAGNOSIS — E1169 Type 2 diabetes mellitus with other specified complication: Secondary | ICD-10-CM

## 2022-03-08 DIAGNOSIS — E785 Hyperlipidemia, unspecified: Secondary | ICD-10-CM

## 2022-03-08 LAB — POCT GLYCOSYLATED HEMOGLOBIN (HGB A1C): Hemoglobin A1C: 7.3 % — AB (ref 4.0–5.6)

## 2022-03-08 MED ORDER — CETIRIZINE HCL 10 MG PO TABS: 10.0000 mg | ORAL_TABLET | Freq: Every day | ORAL | 1 refills | Status: AC

## 2022-03-08 NOTE — Assessment & Plan Note (Signed)
Well-controlled hypertension. BP Readings from Last 3 Encounters:  03/08/22 130/74  12/05/21 120/84  08/30/21 139/84  Of medications. Better controlled diabetes with hemoglobin A1c down to 7.3.  Eating better and losing weight. Continue weekly Trulicity 3 mg and daily Farxiga 10 mg Cardiovascular risk associated with diabetes discussed. Diet and show discussed. Follow-up 3 months. Continue atorvastatin 10 mg daily.

## 2022-03-08 NOTE — Progress Notes (Signed)
Karen [name] 61 y.o.   Chief Complaint  Patient presents with   Follow-up    58mth f/u appt , no concerns     HISTORY OF PRESENT ILLNESS: This is a 61y.o. female A1A here for 344-monthollow-up of diabetes Only on Trulicity and Farxiga. Doing well.  Eating better and losing weight. Has no complaints or medical concerns today.  HPI   Prior to Admission medications   Medication Sig Start Date End Date Taking? Authorizing Provider  atorvastatin (LIPITOR) 10 MG tablet Take 1 tablet (10 mg total) by mouth daily. 08/02/21  Yes Annalaura Sauseda, MiInes BloomerMD  cetirizine (ZYRTEC) 10 MG tablet Take 1 tablet by mouth once daily 08/23/21  Yes MoMaximiano CossNP  cyclobenzaprine (FLEXERIL) 10 MG tablet Take 1 tablet (10 mg total) by mouth 3 (three) times daily as needed for muscle spasms. 01/30/22  Yes Joe Tanney, MiInes BloomerMD  dapagliflozin propanediol (FARXIGA) 10 MG TABS tablet Take 10 mg by mouth daily.   Yes [provider]  Dulaglutide (TRULICITY) 3 MGQQ/7.6PPOPN Inject 3 mg as directed once a week. 12/15/21  Yes Amiah Frohlich, MiInes BloomerMD  esomeprazole (NEXIUM) 40 MG capsule Take 1 capsule (40 mg total) by mouth daily. 07/06/21  Yes Pershing Skidmore, MiInes BloomerMD  methocarbamol (ROBAXIN) 500 MG tablet Take 1 tablet (500 mg total) by mouth 2 (two) times daily. 07/28/21  Yes Melany Wiesman, MiInes BloomerMD  SUMAtriptan (IMITREX) 50 MG tablet Take 1 tablet (50 mg total) by mouth every 2 (two) hours as needed for migraine (Max 2 doses daily). May repeat in 2 hours if headache persists or recurs. 04/26/20  Yes MoMaximiano CossNP  blood glucose meter kit and supplies KIT Dispense based on patient and insurance preference. Use up to four times daily as directed. Patient not taking: Reported on 03/01/2021 09/05/20   SaHorald PollenMD  blood glucose meter kit and supplies Per insurance coverage. Check blood glucose once a day. Dx E11.65 Patient not taking: Reported on 12/05/2021 02/15/20   SaJacelyn PiIrLilia ArgueMD  Continuous Blood Gluc Sensor (DEXCOM G6 SENSOR) MISC USE AS DIRECTED 4 TIMES DAILY Patient not taking: Reported on 08/10/2021 05/29/21   SaHorald PollenMD  Continuous Blood Gluc Transmit (DEKellerMIMindenSE AS DIRECTED 4 TIMES DAILY Patient not taking: Reported on 08/10/2021 03/28/21   SaHorald PollenMD  linaclotide ([facility] MCG CAPS capsule Take 1 capsule (145 mcg total) by mouth daily before breakfast. Patient not taking: Reported on 03/08/2022 03/01/21   SaHorald PollenMD    Allergies  Allergen Reactions   Orange Fruit [Citrus] Hives   Penicillins Hives    Patient Active Problem List   Diagnosis Date Noted   Yeast vaginitis 08/10/2021   Chronic constipation 03/01/2021   Hyperlipidemia associated with type 2 diabetes mellitus ([facility]   Seasonal allergies 04/19/2017   Gastroesophageal reflux disease 04/19/2017   Diabetes mellitus ([facility]/26/2013   Hypertension associated with diabetes ([facility]/26/2013    Past Medical History:  Diagnosis Date   Allergy    Anemia    past hx in her 20's   Diabetes mellitus without complication (HCC)    GERD (gastroesophageal reflux disease)    Hyperlipidemia    Controlled    Hypertension    Controlled     Past Surgical History:  Procedure Laterality Date   ABDOMINAL HYSTERECTOMY     PID; ovaries resected.  Age 61  APPENDECTOMY  Social History   Socioeconomic History   Marital status: Divorced    Spouse name: Not on file   Number of children: Not on file   Years of education: Not on file   Highest education level: Not on file  Occupational History   Not on file  Tobacco Use   Smoking status: Former    Packs/day: 0.70    Years: 30.00    Total pack years: 21.00    Types: Cigarettes   Smokeless tobacco: Never  Vaping Use   Vaping Use: Never used  Substance and Sexual Activity   Alcohol use: No   Drug use: No   Sexual activity: Yes    Birth  control/protection: None  Other Topics Concern   Not on file  Social History Narrative   Marital status: divorced; not dating      Children:  None      Lives: alone      Employment: Radio broadcast assistant       Tobacco: sometimes; smokes 1 pack per week       Alcohol:  None      Drugs: none      Exercise:  sporadic   Social Determinants of Radio broadcast assistant Strain: Not on file  Food Insecurity: Not on file  Transportation Needs: Not on file  Physical Activity: Not on file  Stress: Not on file  Social Connections: Not on file  Intimate Partner Violence: Not on file    Family History  Problem Relation Age of Onset   Cancer Mother 60       uterine cancer   Uterine cancer Mother    Heart disease Sister 42       AMI/CAD with stenting   Hypertension Sister    Breast cancer Maternal Aunt    Colon cancer Neg Hx    Colon polyps Neg Hx    Esophageal cancer Neg Hx    Stomach cancer Neg Hx    Rectal cancer Neg Hx      Review of Systems  Constitutional: Negative.  Negative for chills and fever.  HENT: Negative.  Negative for congestion and sore throat.   Respiratory: Negative.  Negative for cough and shortness of breath.   Cardiovascular: Negative.  Negative for chest pain and palpitations.  Gastrointestinal:  Negative for abdominal pain and nausea.  Genitourinary: Negative.   Musculoskeletal: Negative.   Neurological: Negative.  Negative for dizziness and headaches.  All other systems reviewed and are negative.   Today's Vitals   03/08/22 1421  BP: 130/74  Pulse: 95  Temp: 98.7 F (37.1 C)  TempSrc: Oral  SpO2: 99%  Weight: 189 lb 8 oz (86 kg)  Height: 5' 3"  (1.6 m)   Body mass index is 33.57 kg/m. Wt Readings from Last 3 Encounters:  03/08/22 189 lb 8 oz (86 kg)  12/05/21 197 lb (89.4 kg)  08/10/21 193 lb (87.5 kg)    Physical Exam Vitals reviewed.  Constitutional:      Appearance: Normal appearance.  HENT:     Head: Normocephalic.  Eyes:      Extraocular Movements: Extraocular movements intact.     Pupils: Pupils are equal, round, and reactive to light.  Cardiovascular:     Rate and Rhythm: Normal rate and regular rhythm.     Pulses: Normal pulses.     Heart sounds: Normal heart sounds.  Pulmonary:     Effort: Pulmonary effort is normal.     Breath sounds: Normal breath  sounds.  Skin:    General: Skin is warm and dry.     Capillary Refill: Capillary refill takes less than 2 seconds.  Neurological:     General: No focal deficit present.     Mental Status: She is alert and oriented to person, place, and time.  Psychiatric:        Mood and Affect: Mood normal.        Behavior: Behavior normal.     Results for orders placed or performed in visit on 03/08/22 (from the past 24 hour(s))  POCT HgB A1C     Status: Abnormal   Collection Time: 03/08/22  2:38 PM  Result Value Ref Range   Hemoglobin A1C 7.3 (A) 4.0 - 5.6 %   HbA1c POC (<> result, manual entry)     HbA1c, POC (prediabetic range)     HbA1c, POC (controlled diabetic range)      ASSESSMENT & PLAN: A total of 45 minutes was spent with the patient and counseling/coordination of care regarding preparing for this visit, review of most recent office visit notes, review of most recent blood work results including interpretation of today's hemoglobin A1c, review of multiple chronic medical problems and their management, cardiovascular risks associated with diabetes and hypertension, review of all medications, education on nutrition, prognosis, documentation, and need for follow-up.  Problem List Items Addressed This Visit       Cardiovascular and Mediastinum   Hypertension associated with diabetes ([place]) - Primary    Well-controlled hypertension. BP Readings from Last 3 Encounters:  03/08/22 130/74  12/05/21 120/84  08/30/21 139/84  Of medications. Better controlled diabetes with hemoglobin A1c down to 7.3.  Eating better and losing weight. Continue weekly Trulicity  3 mg and daily Farxiga 10 mg Cardiovascular risk associated with diabetes discussed. Diet and show discussed. Follow-up 3 months. Continue atorvastatin 10 mg daily.         Endocrine   Diabetes mellitus ([place])   Relevant Orders   POCT HgB A1C (Completed)   Hyperlipidemia associated with type 2 diabetes mellitus ([place])    Stable.  Diet and nutrition discussed. Continue lovastatin 10 mg daily. The 10-year ASCVD risk score (Arnett DK, et al., 2019) is: 11.3%   Values used to calculate the score:     Age: 87 years     Sex: Female     Is Non-Hispanic African American: Yes     Diabetic: Yes     Tobacco smoker: No     Systolic Blood Pressure: 027 mmHg     Is BP treated: No     HDL Cholesterol: 46.9 mg/dL     Total Cholesterol: 176 mg/dL         Other   Seasonal allergies   Relevant Medications   cetirizine (ZYRTEC) 10 MG tablet   Patient Instructions  Diabetes Mellitus and Nutrition, Adult When you have diabetes, or diabetes mellitus, it is very important to have healthy eating habits because your blood sugar (glucose) levels are greatly affected by what you eat and drink. Eating healthy foods in the right amounts, at about the same times every day, can help you: Manage your blood glucose. Lower your risk of heart disease. Improve your blood pressure. Reach or maintain a healthy weight. What can affect my meal plan? Every person with diabetes is different, and each person has different needs for a meal plan. Your health care provider may recommend that you work with a dietitian to make a meal plan that is  best for you. Your meal plan may vary depending on factors such as: The calories you need. The medicines you take. Your weight. Your blood glucose, blood pressure, and cholesterol levels. Your activity level. Other health conditions you have, such as heart or kidney disease. How do carbohydrates affect me? Carbohydrates, also called carbs, affect your blood glucose level  more than any other type of food. Eating carbs raises the amount of glucose in your blood. It is important to know how many carbs you can safely have in each meal. This is different for every person. Your dietitian can help you calculate how many carbs you should have at each meal and for each snack. How does alcohol affect me? Alcohol can cause a decrease in blood glucose (hypoglycemia), especially if you use insulin or take certain diabetes medicines by mouth. Hypoglycemia can be a life-threatening condition. Symptoms of hypoglycemia, such as sleepiness, dizziness, and confusion, are similar to symptoms of having too much alcohol. Do not drink alcohol if: Your health care provider tells you not to drink. You are pregnant, may be pregnant, or are planning to become pregnant. If you drink alcohol: Limit how much you have to: 0-1 drink a day for women. 0-2 drinks a day for men. Know how much alcohol is in your drink. In the [place] one drink equals one 12 oz bottle of beer (355 mL), one 5 oz glass of wine (148 mL), or one 1 oz glass of hard liquor (44 mL). Keep yourself hydrated with water, diet soda, or unsweetened iced tea. Keep in mind that regular soda, juice, and other mixers may contain a lot of sugar and must be counted as carbs. What are tips for following this plan?  Reading food labels Start by checking the serving size on the Nutrition Facts label of packaged foods and drinks. The number of calories and the amount of carbs, fats, and other nutrients listed on the label are based on one serving of the item. Many items contain more than one serving per package. Check the total grams (g) of carbs in one serving. Check the number of grams of saturated fats and trans fats in one serving. Choose foods that have a low amount or none of these fats. Check the number of milligrams (mg) of salt (sodium) in one serving. Most people should limit total sodium intake to less than 2,300 mg per  day. Always check the nutrition information of foods labeled as "low-fat" or "nonfat." These foods may be higher in added sugar or refined carbs and should be avoided. Talk to your dietitian to identify your daily goals for nutrients listed on the label. Shopping Avoid buying canned, pre-made, or processed foods. These foods tend to be high in fat, sodium, and added sugar. Shop around the outside edge of the grocery store. This is where you will most often find fresh fruits and vegetables, bulk grains, fresh meats, and fresh dairy products. Cooking Use low-heat cooking methods, such as baking, instead of high-heat cooking methods, such as deep frying. Cook using healthy oils, such as olive, canola, or sunflower oil. Avoid cooking with butter, cream, or high-fat meats. Meal planning Eat meals and snacks regularly, preferably at the same times every day. Avoid going long periods of time without eating. Eat foods that are high in fiber, such as fresh fruits, vegetables, beans, and whole grains. Eat 4-6 oz (112-168 g) of lean protein each day, such as lean meat, chicken, fish, eggs, or tofu. One ounce (oz) (28  g) of lean protein is equal to: 1 oz (28 g) of meat, chicken, or fish. 1 egg.  cup (62 g) of tofu. Eat some foods each day that contain healthy fats, such as avocado, nuts, seeds, and fish. What foods should I eat? Fruits Berries. Apples. Oranges. Peaches. Apricots. Plums. Grapes. Mangoes. Papayas. Pomegranates. Kiwi. Cherries. Vegetables Leafy greens, including lettuce, spinach, kale, chard, collard greens, mustard greens, and cabbage. Beets. Cauliflower. Broccoli. Carrots. Green beans. Tomatoes. Peppers. Onions. Cucumbers. Brussels sprouts. Grains Whole grains, such as whole-wheat or whole-grain bread, crackers, tortillas, cereal, and pasta. Unsweetened oatmeal. Quinoa. Brown or wild rice. Meats and other proteins Seafood. Poultry without skin. Lean cuts of poultry and beef. Tofu.  Nuts. Seeds. Dairy Low-fat or fat-free dairy products such as milk, yogurt, and cheese. The items listed above may not be a complete list of foods and beverages you can eat and drink. Contact a dietitian for more information. What foods should I avoid? Fruits Fruits canned with syrup. Vegetables Canned vegetables. Frozen vegetables with butter or cream sauce. Grains Refined white flour and flour products such as bread, pasta, snack foods, and cereals. Avoid all processed foods. Meats and other proteins Fatty cuts of meat. Poultry with skin. Breaded or fried meats. Processed meat. Avoid saturated fats. Dairy Full-fat yogurt, cheese, or milk. Beverages Sweetened drinks, such as soda or iced tea. The items listed above may not be a complete list of foods and beverages you should avoid. Contact a dietitian for more information. Questions to ask a health care provider Do I need to meet with a certified diabetes care and education specialist? Do I need to meet with a dietitian? What number can I call if I have questions? When are the best times to check my blood glucose? Where to find more information: American Diabetes Association: diabetes.org Academy of Nutrition and Dietetics: eatright.[employer] of Diabetes and Digestive and Kidney Diseases: [employer].is Association of Diabetes Care & Education Specialists: diabeteseducator.org Summary It is important to have healthy eating habits because your blood sugar (glucose) levels are greatly affected by what you eat and drink. It is important to use alcohol carefully. A healthy meal plan will help you manage your blood glucose and lower your risk of heart disease. Your health care provider may recommend that you work with a dietitian to make a meal plan that is best for you. This information is not intended to replace advice given to you by your health care provider. Make sure you discuss any questions you have with your health  care provider. Document Revised: 12/09/2019 Document Reviewed: 12/09/2019 Elsevier Patient Education  [place], MD [facility]

## 2022-03-08 NOTE — Patient Instructions (Signed)

## 2022-03-08 NOTE — Assessment & Plan Note (Signed)
Stable.  Diet and nutrition discussed. Continue lovastatin 10 mg daily. The 10-year ASCVD risk score (Arnett DK, et al., 2019) is: 11.3%   Values used to calculate the score:     Age: 61 years     Sex: Female     Is Non-Hispanic African American: Yes     Diabetic: Yes     Tobacco smoker: No     Systolic Blood Pressure: 832 mmHg     Is BP treated: No     HDL Cholesterol: 46.9 mg/dL     Total Cholesterol: 176 mg/dL

## 2022-03-26 ENCOUNTER — Other Ambulatory Visit: Payer: Self-pay | Admitting: *Deleted

## 2022-03-26 MED ORDER — TRULICITY 3 MG/0.5ML ~~LOC~~ SOAJ: 3.0000 mg | SUBCUTANEOUS | 3 refills | Status: DC

## 2022-05-05 ENCOUNTER — Other Ambulatory Visit: Payer: Self-pay | Admitting: Emergency Medicine

## 2022-05-07 ENCOUNTER — Other Ambulatory Visit: Payer: Self-pay | Admitting: Emergency Medicine

## 2022-05-17 ENCOUNTER — Encounter: Payer: Self-pay | Admitting: Emergency Medicine

## 2022-05-17 NOTE — Telephone Encounter (Signed)
Prescription not necessary

## 2022-05-31 ENCOUNTER — Ambulatory Visit: Payer: Commercial Managed Care - HMO | Admitting: Emergency Medicine

## 2022-06-07 ENCOUNTER — Ambulatory Visit: Payer: Commercial Managed Care - HMO | Admitting: Emergency Medicine

## 2022-06-25 ENCOUNTER — Ambulatory Visit: Payer: Commercial Managed Care - HMO | Admitting: Emergency Medicine

## 2022-06-25 ENCOUNTER — Encounter: Payer: Self-pay | Admitting: Emergency Medicine

## 2022-06-28 ENCOUNTER — Ambulatory Visit: Payer: BC Managed Care – PPO | Admitting: Emergency Medicine

## 2022-06-28 ENCOUNTER — Encounter: Payer: Self-pay | Admitting: Emergency Medicine

## 2022-06-28 VITALS — BP 130/70 | HR 96 | Temp 98.1°F | Ht 63.0 in | Wt 200.1 lb

## 2022-06-28 DIAGNOSIS — E1169 Type 2 diabetes mellitus with other specified complication: Secondary | ICD-10-CM | POA: Diagnosis not present

## 2022-06-28 DIAGNOSIS — I152 Hypertension secondary to endocrine disorders: Secondary | ICD-10-CM

## 2022-06-28 DIAGNOSIS — E1159 Type 2 diabetes mellitus with other circulatory complications: Secondary | ICD-10-CM | POA: Diagnosis not present

## 2022-06-28 DIAGNOSIS — E1165 Type 2 diabetes mellitus with hyperglycemia: Secondary | ICD-10-CM

## 2022-06-28 DIAGNOSIS — E785 Hyperlipidemia, unspecified: Secondary | ICD-10-CM

## 2022-06-28 LAB — POCT GLYCOSYLATED HEMOGLOBIN (HGB A1C): Hemoglobin A1C: 7.3 % — AB (ref 4.0–5.6)

## 2022-06-28 MED ORDER — TRULICITY 4.5 MG/0.5ML ~~LOC~~ SOAJ: 4.5000 mg | SUBCUTANEOUS | 3 refills | Status: DC

## 2022-06-28 NOTE — Progress Notes (Signed)
Karen [name] 62 y.o.   Chief Complaint  Patient presents with   Follow-up    48mth f/u appt    HISTORY OF PRESENT ILLNESS: This is a 62y.o. female here for 357-monthollow-up of hypertension and diabetes BP Readings from Last 3 Encounters:  06/28/22 132/80  03/08/22 130/74  12/05/21 120/84   Lab Results  Component Value Date   HGBA1C 7.3 (A) 06/28/2022   Wt Readings from Last 3 Encounters:  06/28/22 200 lb 2 oz (90.8 kg)  03/08/22 189 lb 8 oz (86 kg)  12/05/21 197 lb (89.4 kg)     HPI   Prior to Admission medications   Medication Sig Start Date End Date Taking? Authorizing Provider  atorvastatin (LIPITOR) 10 MG tablet Take 1 tablet (10 mg total) by mouth daily. 08/02/21  Yes Winter Jocelyn, MiInes BloomerMD  cetirizine (ZYRTEC) 10 MG tablet Take 1 tablet (10 mg total) by mouth daily. 03/08/22  Yes Gayanne Prescott, MiInes BloomerMD  cyclobenzaprine (FLEXERIL) 10 MG tablet Take 1 tablet by mouth three times daily as needed for muscle spasm 05/07/22  Yes Arlee Bossard, MiInes BloomerMD  dapagliflozin propanediol (FARXIGA) 10 MG TABS tablet Take 10 mg by mouth daily.   Yes [provider]  Dulaglutide (TRULICITY) 3 MGJX/9.1YNOPN Inject 3 mg as directed once a week. 03/26/22  Yes Jacquis Paxton, MiInes BloomerMD  esomeprazole (NEXIUM) 40 MG capsule Take 1 capsule (40 mg total) by mouth daily. 07/06/21  Yes Valley Ke, MiInes BloomerMD  methocarbamol (ROBAXIN) 500 MG tablet Take 1 tablet (500 mg total) by mouth 2 (two) times daily. 07/28/21  Yes Zakaria Fromer, MiInes BloomerMD  SUMAtriptan (IMITREX) 50 MG tablet Take 1 tablet (50 mg total) by mouth every 2 (two) hours as needed for migraine (Max 2 doses daily). May repeat in 2 hours if headache persists or recurs. 04/26/20  Yes MoMaximiano CossNP  blood glucose meter kit and supplies KIT Dispense based on patient and insurance preference. Use up to four times daily as directed. Patient not taking: Reported on 03/01/2021 09/05/20   SaHorald PollenMD   blood glucose meter kit and supplies Per insurance coverage. Check blood glucose once a day. Dx E11.65 Patient not taking: Reported on 12/05/2021 02/15/20   SaJacelyn PiIrLilia ArgueMD  linaclotide ([facility] MCG CAPS capsule Take 1 capsule (145 mcg total) by mouth daily before breakfast. Patient not taking: Reported on 03/08/2022 03/01/21   SaHorald PollenMD    Allergies  Allergen Reactions   Orange Fruit [Citrus] Hives   Penicillins Hives    Patient Active Problem List   Diagnosis Date Noted   Yeast vaginitis 08/10/2021   Chronic constipation 03/01/2021   Hyperlipidemia associated with type 2 diabetes mellitus ([facility]08/03/2019   Seasonal allergies 04/19/2017   Gastroesophageal reflux disease 04/19/2017   Diabetes mellitus ([facility]09/26/2013   Hypertension associated with diabetes ([facility]09/26/2013    Past Medical History:  Diagnosis Date   Allergy    Anemia    past hx in her 20's   Diabetes mellitus without complication (HCC)    GERD (gastroesophageal reflux disease)    Hyperlipidemia    Controlled    Hypertension    Controlled     Past Surgical History:  Procedure Laterality Date   ABDOMINAL HYSTERECTOMY     PID; ovaries resected.  Age 62  APPENDECTOMY      Social History   Socioeconomic History   Marital status: Divorced  Spouse name: Not on file   Number of children: Not on file   Years of education: Not on file   Highest education level: Not on file  Occupational History   Not on file  Tobacco Use   Smoking status: Former    Packs/day: 0.70    Years: 30.00    Total pack years: 21.00    Types: Cigarettes   Smokeless tobacco: Never  Vaping Use   Vaping Use: Never used  Substance and Sexual Activity   Alcohol use: No   Drug use: No   Sexual activity: Yes    Birth control/protection: None  Other Topics Concern   Not on file  Social History Narrative   Marital status: divorced; not dating      Children:  None      Lives: alone       Employment: Radio broadcast assistant       Tobacco: sometimes; smokes 1 pack per week       Alcohol:  None      Drugs: none      Exercise:  sporadic   Social Determinants of Radio broadcast assistant Strain: Not on file  Food Insecurity: Not on file  Transportation Needs: Not on file  Physical Activity: Not on file  Stress: Not on file  Social Connections: Not on file  Intimate Partner Violence: Not on file    Family History  Problem Relation Age of Onset   Cancer Mother 80       uterine cancer   Uterine cancer Mother    Heart disease Sister 81       AMI/CAD with stenting   Hypertension Sister    Breast cancer Maternal Aunt    Colon cancer Neg Hx    Colon polyps Neg Hx    Esophageal cancer Neg Hx    Stomach cancer Neg Hx    Rectal cancer Neg Hx      Review of Systems  Constitutional: Negative.  Negative for chills and fever.  HENT: Negative.  Negative for congestion and sore throat.   Respiratory: Negative.  Negative for cough and shortness of breath.   Cardiovascular: Negative.  Negative for chest pain and palpitations.  Gastrointestinal: Negative.  Negative for abdominal pain, diarrhea, nausea and vomiting.  Genitourinary: Negative.  Negative for dysuria and hematuria.  Skin: Negative.  Negative for rash.  Neurological: Negative.  Negative for dizziness and headaches.  All other systems reviewed and are negative.  Today's Vitals   06/28/22 0921  BP: 132/80  Pulse: 96  Temp: 98.1 F (36.7 C)  TempSrc: Oral  SpO2: 97%  Weight: 200 lb 2 oz (90.8 kg)  Height: '5\' 3"'$  (1.6 m)   Body mass index is 35.45 kg/m.   Physical Exam Vitals reviewed.  Constitutional:      Appearance: Normal appearance. She is obese.  HENT:     Head: Normocephalic.  Eyes:     Extraocular Movements: Extraocular movements intact.  Cardiovascular:     Rate and Rhythm: Normal rate and regular rhythm.     Pulses: Normal pulses.     Heart sounds: Normal heart sounds.  Pulmonary:      Effort: Pulmonary effort is normal.     Breath sounds: Normal breath sounds.  Musculoskeletal:     Cervical back: No tenderness.  Lymphadenopathy:     Cervical: No cervical adenopathy.  Skin:    General: Skin is warm and dry.     Capillary Refill: Capillary refill takes  less than 2 seconds.  Neurological:     General: No focal deficit present.     Mental Status: She is alert and oriented to person, place, and time.  Psychiatric:        Mood and Affect: Mood normal.        Behavior: Behavior normal.    Results for orders placed or performed in visit on 06/28/22 (from the past 24 hour(s))  POCT HgB A1C     Status: Abnormal   Collection Time: 06/28/22  9:32 AM  Result Value Ref Range   Hemoglobin A1C 7.3 (A) 4.0 - 5.6 %   HbA1c POC (<> result, manual entry)     HbA1c, POC (prediabetic range)     HbA1c, POC (controlled diabetic range)       ASSESSMENT & PLAN: A total of 42 minutes was spent with the patient and counseling/coordination of care regarding preparing for this visit, review of most recent office visit notes, review of most recent blood work results including interpretation of today's hemoglobin A1c, review of multiple chronic medical conditions under management, review of all medications and changes made, cardiovascular risks associated with uncontrolled diabetes, education on nutrition, prognosis, documentation, need for follow-up in 3 months.  Problem List Items Addressed This Visit       Cardiovascular and Mediastinum   Hypertension associated with diabetes ([place]) - Primary    Well-controlled hypertension. Off medications. Hemoglobin A1c is still the same as before at 7.3.  Not yet at goal. Recommend to increase Trulicity to 4.5 mg weekly and continue Farxiga 10 mg daily. Cardiovascular risk associated with uncontrolled diabetes discussed. Diet and nutrition discussed. Follow-up in 3 months.      Relevant Medications   Dulaglutide (TRULICITY) 4.5 AS/5.0NL SOPN      Endocrine   Diabetes mellitus (HCC)   Relevant Medications   Dulaglutide (TRULICITY) 4.5 ZJ/6.7HA SOPN   Other Relevant Orders   POCT HgB A1C (Completed)   Hyperlipidemia associated with type 2 diabetes mellitus ([place])    Diet and nutrition discussed. Recommend to continue atorvastatin 10 mg daily. The 10-year ASCVD risk score (Arnett DK, et al., 2019) is: 12.7%   Values used to calculate the score:     Age: 45 years     Sex: Female     Is Non-Hispanic African American: Yes     Diabetic: Yes     Tobacco smoker: No     Systolic Blood Pressure: 193 mmHg     Is BP treated: No     HDL Cholesterol: 46.9 mg/dL     Total Cholesterol: 176 mg/dL       Relevant Medications   Dulaglutide (TRULICITY) 4.5 XT/0.2IO SOPN   Patient Instructions  Diabetes Mellitus and Nutrition, Adult When you have diabetes, or diabetes mellitus, it is very important to have healthy eating habits because your blood sugar (glucose) levels are greatly affected by what you eat and drink. Eating healthy foods in the right amounts, at about the same times every day, can help you: Manage your blood glucose. Lower your risk of heart disease. Improve your blood pressure. Reach or maintain a healthy weight. What can affect my meal plan? Every person with diabetes is different, and each person has different needs for a meal plan. Your health care provider may recommend that you work with a dietitian to make a meal plan that is best for you. Your meal plan may vary depending on factors such as: The calories you need. The medicines you  take. Your weight. Your blood glucose, blood pressure, and cholesterol levels. Your activity level. Other health conditions you have, such as heart or kidney disease. How do carbohydrates affect me? Carbohydrates, also called carbs, affect your blood glucose level more than any other type of food. Eating carbs raises the amount of glucose in your blood. It is important to know how  many carbs you can safely have in each meal. This is different for every person. Your dietitian can help you calculate how many carbs you should have at each meal and for each snack. How does alcohol affect me? Alcohol can cause a decrease in blood glucose (hypoglycemia), especially if you use insulin or take certain diabetes medicines by mouth. Hypoglycemia can be a life-threatening condition. Symptoms of hypoglycemia, such as sleepiness, dizziness, and confusion, are similar to symptoms of having too much alcohol. Do not drink alcohol if: Your health care provider tells you not to drink. You are pregnant, may be pregnant, or are planning to become pregnant. If you drink alcohol: Limit how much you have to: 0-1 drink a day for women. 0-2 drinks a day for men. Know how much alcohol is in your drink. In the [place] one drink equals one 12 oz bottle of beer (355 mL), one 5 oz glass of wine (148 mL), or one 1 oz glass of hard liquor (44 mL). Keep yourself hydrated with water, diet soda, or unsweetened iced tea. Keep in mind that regular soda, juice, and other mixers may contain a lot of sugar and must be counted as carbs. What are tips for following this plan?  Reading food labels Start by checking the serving size on the Nutrition Facts label of packaged foods and drinks. The number of calories and the amount of carbs, fats, and other nutrients listed on the label are based on one serving of the item. Many items contain more than one serving per package. Check the total grams (g) of carbs in one serving. Check the number of grams of saturated fats and trans fats in one serving. Choose foods that have a low amount or none of these fats. Check the number of milligrams (mg) of salt (sodium) in one serving. Most people should limit total sodium intake to less than 2,300 mg per day. Always check the nutrition information of foods labeled as "low-fat" or "nonfat." These foods may be higher in added sugar  or refined carbs and should be avoided. Talk to your dietitian to identify your daily goals for nutrients listed on the label. Shopping Avoid buying canned, pre-made, or processed foods. These foods tend to be high in fat, sodium, and added sugar. Shop around the outside edge of the grocery store. This is where you will most often find fresh fruits and vegetables, bulk grains, fresh meats, and fresh dairy products. Cooking Use low-heat cooking methods, such as baking, instead of high-heat cooking methods, such as deep frying. Cook using healthy oils, such as olive, canola, or sunflower oil. Avoid cooking with butter, cream, or high-fat meats. Meal planning Eat meals and snacks regularly, preferably at the same times every day. Avoid going long periods of time without eating. Eat foods that are high in fiber, such as fresh fruits, vegetables, beans, and whole grains. Eat 4-6 oz (112-168 g) of lean protein each day, such as lean meat, chicken, fish, eggs, or tofu. One ounce (oz) (28 g) of lean protein is equal to: 1 oz (28 g) of meat, chicken, or fish. 1 egg.  cup (  62 g) of tofu. Eat some foods each day that contain healthy fats, such as avocado, nuts, seeds, and fish. What foods should I eat? Fruits Berries. Apples. Oranges. Peaches. Apricots. Plums. Grapes. Mangoes. Papayas. Pomegranates. Kiwi. Cherries. Vegetables Leafy greens, including lettuce, spinach, kale, chard, collard greens, mustard greens, and cabbage. Beets. Cauliflower. Broccoli. Carrots. Green beans. Tomatoes. Peppers. Onions. Cucumbers. Brussels sprouts. Grains Whole grains, such as whole-wheat or whole-grain bread, crackers, tortillas, cereal, and pasta. Unsweetened oatmeal. Quinoa. Brown or wild rice. Meats and other proteins Seafood. Poultry without skin. Lean cuts of poultry and beef. Tofu. Nuts. Seeds. Dairy Low-fat or fat-free dairy products such as milk, yogurt, and cheese. The items listed above may not be a  complete list of foods and beverages you can eat and drink. Contact a dietitian for more information. What foods should I avoid? Fruits Fruits canned with syrup. Vegetables Canned vegetables. Frozen vegetables with butter or cream sauce. Grains Refined white flour and flour products such as bread, pasta, snack foods, and cereals. Avoid all processed foods. Meats and other proteins Fatty cuts of meat. Poultry with skin. Breaded or fried meats. Processed meat. Avoid saturated fats. Dairy Full-fat yogurt, cheese, or milk. Beverages Sweetened drinks, such as soda or iced tea. The items listed above may not be a complete list of foods and beverages you should avoid. Contact a dietitian for more information. Questions to ask a health care provider Do I need to meet with a certified diabetes care and education specialist? Do I need to meet with a dietitian? What number can I call if I have questions? When are the best times to check my blood glucose? Where to find more information: American Diabetes Association: diabetes.org Academy of Nutrition and Dietetics: eatright.[employer] of Diabetes and Digestive and Kidney Diseases: AmenCredit.is Association of Diabetes Care & Education Specialists: diabeteseducator.org Summary It is important to have healthy eating habits because your blood sugar (glucose) levels are greatly affected by what you eat and drink. It is important to use alcohol carefully. A healthy meal plan will help you manage your blood glucose and lower your risk of heart disease. Your health care provider may recommend that you work with a dietitian to make a meal plan that is best for you. This information is not intended to replace advice given to you by your health care provider. Make sure you discuss any questions you have with your health care provider. Document Revised: 12/09/2019 Document Reviewed: 12/09/2019 Elsevier Patient Education  [place], MD [facility]

## 2022-06-28 NOTE — Assessment & Plan Note (Signed)
Diet and nutrition discussed. Recommend to continue atorvastatin 10 mg daily. The 10-year ASCVD risk score (Arnett DK, et al., 2019) is: 12.7%   Values used to calculate the score:     Age: 62 years     Sex: Female     Is Non-Hispanic African American: Yes     Diabetic: Yes     Tobacco smoker: No     Systolic Blood Pressure: 143 mmHg     Is BP treated: No     HDL Cholesterol: 46.9 mg/dL     Total Cholesterol: 176 mg/dL

## 2022-06-28 NOTE — Patient Instructions (Signed)

## 2022-06-28 NOTE — Assessment & Plan Note (Signed)
Well-controlled hypertension. Off medications. Hemoglobin A1c is still the same as before at 7.3.  Not yet at goal. Recommend to increase Trulicity to 4.5 mg weekly and continue Farxiga 10 mg daily. Cardiovascular risk associated with uncontrolled diabetes discussed. Diet and nutrition discussed. Follow-up in 3 months.

## 2022-08-21 ENCOUNTER — Other Ambulatory Visit: Payer: Self-pay | Admitting: *Deleted

## 2022-08-21 ENCOUNTER — Other Ambulatory Visit: Payer: Self-pay | Admitting: Emergency Medicine

## 2022-08-21 ENCOUNTER — Telehealth: Payer: Self-pay | Admitting: Emergency Medicine

## 2022-08-21 DIAGNOSIS — E119 Type 2 diabetes mellitus without complications: Secondary | ICD-10-CM

## 2022-08-21 DIAGNOSIS — E1169 Type 2 diabetes mellitus with other specified complication: Secondary | ICD-10-CM

## 2022-08-21 MED ORDER — CYCLOBENZAPRINE HCL 10 MG PO TABS: 10.0000 mg | ORAL_TABLET | Freq: Three times a day (TID) | ORAL | 0 refills | Status: DC | PRN

## 2022-08-21 MED ORDER — METHOCARBAMOL 500 MG PO TABS: 500.0000 mg | ORAL_TABLET | Freq: Two times a day (BID) | ORAL | 0 refills | Status: DC

## 2022-08-21 MED ORDER — ATORVASTATIN CALCIUM 10 MG PO TABS
10.0000 mg | ORAL_TABLET | Freq: Every day | ORAL | 3 refills | Status: AC
Start: 2022-08-21 — End: ?

## 2022-08-21 NOTE — Telephone Encounter (Signed)
Gerald Stabs with [facility] calls today in need of clarification on a prescription.  They are wanting to know if the methocarbamol (ROBAXIN) 500 MG tablet was prescribed as an alternative to the  cyclobenzaprine (FLEXERIL) 10 MG tablet or was it meant to be taken together with that medication?  CB: [phone]

## 2022-08-21 NOTE — Telephone Encounter (Signed)
One or the other.  Not both.  Thanks.

## 2022-08-22 NOTE — Telephone Encounter (Signed)
Called pharmacy to inform them of provider recommendation on medication

## 2022-09-17 ENCOUNTER — Encounter: Payer: Self-pay | Admitting: Emergency Medicine

## 2022-09-17 ENCOUNTER — Other Ambulatory Visit: Payer: Self-pay | Admitting: Emergency Medicine

## 2022-09-17 DIAGNOSIS — I152 Hypertension secondary to endocrine disorders: Secondary | ICD-10-CM

## 2022-10-04 ENCOUNTER — Ambulatory Visit: Payer: BC Managed Care – PPO | Admitting: Emergency Medicine

## 2022-10-08 ENCOUNTER — Encounter: Payer: Self-pay | Admitting: Emergency Medicine

## 2022-10-08 MED ORDER — TIRZEPATIDE 5 MG/0.5ML ~~LOC~~ SOAJ: 5.0000 mg | SUBCUTANEOUS | 3 refills | Status: DC

## 2022-10-08 NOTE — Telephone Encounter (Signed)
Recommend Mounjaro 5 mg weekly

## 2022-10-08 NOTE — Telephone Encounter (Signed)
Patient needs a PA started for her [facility]

## 2022-10-08 NOTE — Telephone Encounter (Signed)
Mounjaro, Ozempic, Rybelsus, find out what is available and covered under her insurance.  Thanks.

## 2022-10-09 ENCOUNTER — Telehealth: Payer: Self-pay

## 2022-10-09 ENCOUNTER — Encounter: Payer: Self-pay | Admitting: Emergency Medicine

## 2022-10-09 ENCOUNTER — Other Ambulatory Visit (HOSPITAL_COMMUNITY): Payer: Self-pay

## 2022-10-09 NOTE — Telephone Encounter (Signed)
Pharmacy Patient Advocate Encounter   Received notification that prior authorization for Mounjaro 5mg /0.23ml is required/requested.   PA submitted on 10/09/22 to (ins) [employer] via rxb.[email or web address] Key# [id number] Status is pending

## 2022-10-10 ENCOUNTER — Other Ambulatory Visit (HOSPITAL_COMMUNITY): Payer: Self-pay

## 2022-10-10 ENCOUNTER — Encounter: Payer: Self-pay | Admitting: *Deleted

## 2022-10-10 NOTE — Telephone Encounter (Signed)
Notified patient of PA approval

## 2022-10-10 NOTE — Telephone Encounter (Signed)
Patient Advocate Encounter  Prior Authorization for Karen [name] has been approved with [employer].    PA# [id number]   Per [facility] test claim, copay for 28 days supply is $0   Placed a call to [facility] pharmacy to notify of the approval.

## 2022-10-31 ENCOUNTER — Encounter: Payer: Self-pay | Admitting: Emergency Medicine

## 2022-10-31 MED ORDER — TIRZEPATIDE 5 MG/0.5ML ~~LOC~~ SOAJ: 5.0000 mg | SUBCUTANEOUS | 3 refills | Status: DC

## 2022-11-01 ENCOUNTER — Ambulatory Visit: Payer: BC Managed Care – PPO | Admitting: Emergency Medicine

## 2022-11-06 ENCOUNTER — Ambulatory Visit: Payer: BC Managed Care – PPO | Admitting: Emergency Medicine

## 2022-11-06 ENCOUNTER — Encounter: Payer: Self-pay | Admitting: Emergency Medicine

## 2022-11-06 VITALS — BP 128/80 | HR 104 | Temp 97.9°F | Ht 63.0 in | Wt 193.6 lb

## 2022-11-06 DIAGNOSIS — G43919 Migraine, unspecified, intractable, without status migrainosus: Secondary | ICD-10-CM

## 2022-11-06 DIAGNOSIS — E1159 Type 2 diabetes mellitus with other circulatory complications: Secondary | ICD-10-CM

## 2022-11-06 DIAGNOSIS — K219 Gastro-esophageal reflux disease without esophagitis: Secondary | ICD-10-CM | POA: Diagnosis not present

## 2022-11-06 DIAGNOSIS — I152 Hypertension secondary to endocrine disorders: Secondary | ICD-10-CM

## 2022-11-06 DIAGNOSIS — E785 Hyperlipidemia, unspecified: Secondary | ICD-10-CM | POA: Diagnosis not present

## 2022-11-06 DIAGNOSIS — Z7984 Long term (current) use of oral hypoglycemic drugs: Secondary | ICD-10-CM

## 2022-11-06 DIAGNOSIS — E1169 Type 2 diabetes mellitus with other specified complication: Secondary | ICD-10-CM | POA: Diagnosis not present

## 2022-11-06 DIAGNOSIS — Z7985 Long-term (current) use of injectable non-insulin antidiabetic drugs: Secondary | ICD-10-CM

## 2022-11-06 LAB — POCT GLYCOSYLATED HEMOGLOBIN (HGB A1C): Hemoglobin A1C: 8 % — AB (ref 4.0–5.6)

## 2022-11-06 MED ORDER — TIRZEPATIDE 7.5 MG/0.5ML ~~LOC~~ SOAJ
7.5000 mg | SUBCUTANEOUS | 3 refills | Status: DC
Start: 2022-11-06 — End: 2023-09-19

## 2022-11-06 MED ORDER — SUMATRIPTAN SUCCINATE 50 MG PO TABS
50.0000 mg | ORAL_TABLET | ORAL | 3 refills | Status: AC | PRN
Start: 2022-11-06 — End: ?

## 2022-11-06 NOTE — Assessment & Plan Note (Signed)
History of migraines Recent migraine headache Patient works third shift Unable to sleep due to headache Imitrex works for her.  New prescription sent to pharmacy of record. Recommend a couple days of to recover.

## 2022-11-06 NOTE — Patient Instructions (Signed)

## 2022-11-06 NOTE — Progress Notes (Signed)
Karen [name] 62 y.o.   Chief Complaint  Patient presents with   Medical Management of Chronic Issues    3 month f/u, would like to discuss migraines and taking 2 days off work    HISTORY OF PRESENT ILLNESS: This is a 62 y.o. female here for 28-month follow-up of chronic medical conditions including diabetes Eating better and losing weight. History of migraine headaches.  Recent migraine last night. Requesting refill on Imitrex.  Works well for her. Wt Readings from Last 3 Encounters:  11/06/22 193 lb 9.6 oz (87.8 kg)  06/28/22 200 lb 2 oz (90.8 kg)  03/08/22 189 lb 8 oz (86 kg)     HPI   Prior to Admission medications   Medication Sig Start Date End Date Taking? Authorizing Provider  atorvastatin (LIPITOR) 10 MG tablet Take 1 tablet (10 mg total) by mouth daily. 08/21/22  Yes Aadon Gorelik, Eilleen Kempf, MD  blood glucose meter kit and supplies KIT Dispense based on patient and insurance preference. Use up to four times daily as directed. 09/05/20  Yes Tiffeny Minchew, Eilleen Kempf, MD  blood glucose meter kit and supplies Per insurance coverage. Check blood glucose once a day. Dx E11.65 02/15/20  Yes Lezlie Lye, Meda Coffee, MD  cetirizine (ZYRTEC) 10 MG tablet Take 1 tablet (10 mg total) by mouth daily. 03/08/22  Yes Joselyn Edling, Eilleen Kempf, MD  cyclobenzaprine (FLEXERIL) 10 MG tablet Take 1 tablet (10 mg total) by mouth 3 (three) times daily as needed. for muscle spams 08/21/22  Yes Lloyde Ludlam, Eilleen Kempf, MD  dapagliflozin propanediol (FARXIGA) 10 MG TABS tablet Take 10 mg by mouth daily.   Yes [provider]  esomeprazole (NEXIUM) 40 MG capsule Take 1 capsule (40 mg total) by mouth daily. 07/06/21  Yes Modupe Shampine, Eilleen Kempf, MD  linaclotide [facility]) 145 MCG CAPS capsule Take 1 capsule (145 mcg total) by mouth daily before breakfast. 03/01/21  Yes Juluis Fitzsimmons, Eilleen Kempf, MD  methocarbamol (ROBAXIN) 500 MG tablet Take 1 tablet (500 mg total) by mouth 2 (two) times daily. 08/21/22  Yes  Jameka Ivie, Eilleen Kempf, MD  SUMAtriptan (IMITREX) 50 MG tablet Take 1 tablet (50 mg total) by mouth every 2 (two) hours as needed for migraine (Max 2 doses daily). May repeat in 2 hours if headache persists or recurs. 04/26/20  Yes Janeece Agee, NP  tirzepatide [facility]) 5 MG/0.5ML Pen Inject 5 mg into the skin once a week. 10/31/22  Yes Georgina Quint, MD    Allergies  Allergen Reactions   Orange Fruit [Citrus] Hives   Penicillins Hives    Patient Active Problem List   Diagnosis Date Noted   Chronic constipation 03/01/2021   Hyperlipidemia associated with type 2 diabetes mellitus (HCC) 12/30/2018   Seasonal allergies 04/19/2017   Gastroesophageal reflux disease 04/19/2017   Diabetes mellitus (HCC) 02/14/2012   Hypertension associated with diabetes (HCC) 02/14/2012    Past Medical History:  Diagnosis Date   Allergy    Anemia    past hx in her 20's   Diabetes mellitus without complication (HCC)    GERD (gastroesophageal reflux disease)    Hyperlipidemia    Controlled    Hypertension    Controlled     Past Surgical History:  Procedure Laterality Date   ABDOMINAL HYSTERECTOMY     PID; ovaries resected.  Age 89.   APPENDECTOMY      Social History   Socioeconomic History   Marital status: Divorced    Spouse name: Not on file  Number of children: Not on file   Years of education: Not on file   Highest education level: 12th grade  Occupational History   Not on file  Tobacco Use   Smoking status: Former    Packs/day: 0.70    Years: 30.00    Additional pack years: 0.00    Total pack years: 21.00    Types: Cigarettes   Smokeless tobacco: Never  Vaping Use   Vaping Use: Never used  Substance and Sexual Activity   Alcohol use: No   Drug use: No   Sexual activity: Yes    Birth control/protection: None  Other Topics Concern   Not on file  Social History Narrative   Marital status: divorced; not dating      Children:  None      Lives: alone       Employment: International aid/development worker       Tobacco: sometimes; smokes 1 pack per week       Alcohol:  None      Drugs: none      Exercise:  sporadic   Social Determinants of Health   Financial Resource Strain: Low Risk  (11/05/2022)   Overall Financial Resource Strain (CARDIA)    Difficulty of Paying Living Expenses: Not hard at all  Food Insecurity: No Food Insecurity (11/05/2022)   Hunger Vital Sign    Worried About Running Out of Food in the Last Year: Never true    Ran Out of Food in the Last Year: Never true  Transportation Needs: No Transportation Needs (11/05/2022)   PRAPARE - Administrator, Civil Service (Medical): No    Lack of Transportation (Non-Medical): No  Physical Activity: Insufficiently Active (11/05/2022)   Exercise Vital Sign    Days of Exercise per Week: 3 days    Minutes of Exercise per Session: 30 min  Stress: No Stress Concern Present (11/05/2022)   [employer] of Occupational Health - Occupational Stress Questionnaire    Feeling of Stress : Not at all  Social Connections: Moderately Isolated (11/05/2022)   Social Connection and Isolation Panel [NHANES]    Frequency of Communication with Friends and Family: More than three times a week    Frequency of Social Gatherings with Friends and Family: More than three times a week    Attends Religious Services: 1 to 4 times per year    Active Member of [employer] or Organizations: No    Attends Engineer, structural: Not on file    Marital Status: Divorced  Catering manager Violence: Not on file    Family History  Problem Relation Age of Onset   Cancer Mother 93       uterine cancer   Uterine cancer Mother    Heart disease Sister 19       AMI/CAD with stenting   Hypertension Sister    Breast cancer Maternal Aunt    Colon cancer Neg Hx    Colon polyps Neg Hx    Esophageal cancer Neg Hx    Stomach cancer Neg Hx    Rectal cancer Neg Hx      Review of Systems  Constitutional: Negative.   Negative for chills and fever.  HENT: Negative.  Negative for congestion and sore throat.   Respiratory: Negative.  Negative for cough and shortness of breath.   Cardiovascular: Negative.  Negative for chest pain and palpitations.  Gastrointestinal:  Negative for abdominal pain, diarrhea, nausea and vomiting.  Genitourinary: Negative.  Negative for  dysuria and hematuria.  Skin: Negative.  Negative for rash.  Neurological:  Positive for headaches.  All other systems reviewed and are negative.   Vitals:   11/06/22 1511  BP: 128/80  Pulse: (!) 104  Temp: 97.9 F (36.6 C)  SpO2: 97%    Physical Exam Vitals reviewed.  Constitutional:      Appearance: Normal appearance.  HENT:     Head: Normocephalic.     Mouth/Throat:     Mouth: Mucous membranes are moist.     Pharynx: Oropharynx is clear.  Eyes:     Extraocular Movements: Extraocular movements intact.  Cardiovascular:     Rate and Rhythm: Normal rate.  Pulmonary:     Effort: Pulmonary effort is normal.  Skin:    General: Skin is warm and dry.     Capillary Refill: Capillary refill takes less than 2 seconds.  Neurological:     General: No focal deficit present.     Mental Status: She is alert and oriented to person, place, and time.  Psychiatric:        Mood and Affect: Mood normal.        Behavior: Behavior normal.    Results for orders placed or performed in visit on 11/06/22 (from the past 24 hour(s))  POCT glycosylated hemoglobin (Hb A1C)     Status: Abnormal   Collection Time: 11/06/22  3:35 PM  Result Value Ref Range   Hemoglobin A1C 8.0 (A) 4.0 - 5.6 %   HbA1c POC (<> result, manual entry)     HbA1c, POC (prediabetic range)     HbA1c, POC (controlled diabetic range)       ASSESSMENT & PLAN: A total of 46 minutes was spent with the patient and counseling/coordination of care regarding preparing for this visit, review of most recent office visit notes, review of most recent blood work results including  interpretation of today's hemoglobin A1c, review of multiple chronic medical conditions and their management, cardiovascular risks associated with diabetes and hypertension, review of all medications and changes made, education on nutrition, prognosis, documentation, and need for follow-up  Problem List Items Addressed This Visit       Cardiovascular and Mediastinum   Hypertension associated with diabetes (HCC) - Primary    Well-controlled hypertension Off medications BP Readings from Last 3 Encounters:  11/06/22 128/80  06/28/22 130/70  03/08/22 130/74  Hemoglobin A1c still not at goal at 8.0 Recommend to continue Farxiga 10 mg daily and increase Mounjaro to 7.5 mg weekly Cardiovascular risks associated with uncontrolled diabetes discussed Diet and nutrition discussed Follow-up in 3 months       Relevant Medications   tirzepatide (MOUNJARO) 7.5 MG/0.5ML Pen   Intractable migraine without status migrainosus    History of migraines Recent migraine headache Patient works third shift Unable to sleep due to headache Imitrex works for her.  New prescription sent to pharmacy of record. Recommend a couple days of to recover.      Relevant Medications   SUMAtriptan (IMITREX) 50 MG tablet     Digestive   Gastroesophageal reflux disease    Well-controlled on asymptomatic Continues 40 mg of Nexium as needed        Endocrine   Hyperlipidemia associated with type 2 diabetes mellitus (HCC)    Stable chronic condition Continue atorvastatin 10 mg daily Diet and nutrition discussed The 10-year ASCVD risk score (Arnett DK, et al., 2019) is: 11.7%   Values used to calculate the score:  Age: 68 years     Sex: Female     Is Non-Hispanic African American: Yes     Diabetic: Yes     Tobacco smoker: No     Systolic Blood Pressure: 128 mmHg     Is BP treated: No     HDL Cholesterol: 46.9 mg/dL     Total Cholesterol: 176 mg/dL       Relevant Medications   tirzepatide (MOUNJARO)  7.5 MG/0.5ML Pen   Other Relevant Orders   POCT glycosylated hemoglobin (Hb A1C) (Completed)   Patient Instructions  Diabetes Mellitus and Nutrition, Adult When you have diabetes, or diabetes mellitus, it is very important to have healthy eating habits because your blood sugar (glucose) levels are greatly affected by what you eat and drink. Eating healthy foods in the right amounts, at about the same times every day, can help you: Manage your blood glucose. Lower your risk of heart disease. Improve your blood pressure. Reach or maintain a healthy weight. What can affect my meal plan? Every person with diabetes is different, and each person has different needs for a meal plan. Your health care provider may recommend that you work with a dietitian to make a meal plan that is best for you. Your meal plan may vary depending on factors such as: The calories you need. The medicines you take. Your weight. Your blood glucose, blood pressure, and cholesterol levels. Your activity level. Other health conditions you have, such as heart or kidney disease. How do carbohydrates affect me? Carbohydrates, also called carbs, affect your blood glucose level more than any other type of food. Eating carbs raises the amount of glucose in your blood. It is important to know how many carbs you can safely have in each meal. This is different for every person. Your dietitian can help you calculate how many carbs you should have at each meal and for each snack. How does alcohol affect me? Alcohol can cause a decrease in blood glucose (hypoglycemia), especially if you use insulin or take certain diabetes medicines by mouth. Hypoglycemia can be a life-threatening condition. Symptoms of hypoglycemia, such as sleepiness, dizziness, and confusion, are similar to symptoms of having too much alcohol. Do not drink alcohol if: Your health care provider tells you not to drink. You are pregnant, may be pregnant, or are  planning to become pregnant. If you drink alcohol: Limit how much you have to: 0-1 drink a day for women. 0-2 drinks a day for men. Know how much alcohol is in your drink. In the [place] one drink equals one 12 oz bottle of beer (355 mL), one 5 oz glass of wine (148 mL), or one 1 oz glass of hard liquor (44 mL). Keep yourself hydrated with water, diet soda, or unsweetened iced tea. Keep in mind that regular soda, juice, and other mixers may contain a lot of sugar and must be counted as carbs. What are tips for following this plan?  Reading food labels Start by checking the serving size on the Nutrition Facts label of packaged foods and drinks. The number of calories and the amount of carbs, fats, and other nutrients listed on the label are based on one serving of the item. Many items contain more than one serving per package. Check the total grams (g) of carbs in one serving. Check the number of grams of saturated fats and trans fats in one serving. Choose foods that have a low amount or none of these fats. Check the number  of milligrams (mg) of salt (sodium) in one serving. Most people should limit total sodium intake to less than 2,300 mg per day. Always check the nutrition information of foods labeled as "low-fat" or "nonfat." These foods may be higher in added sugar or refined carbs and should be avoided. Talk to your dietitian to identify your daily goals for nutrients listed on the label. Shopping Avoid buying canned, pre-made, or processed foods. These foods tend to be high in fat, sodium, and added sugar. Shop around the outside edge of the grocery store. This is where you will most often find fresh fruits and vegetables, bulk grains, fresh meats, and fresh dairy products. Cooking Use low-heat cooking methods, such as baking, instead of high-heat cooking methods, such as deep frying. Cook using healthy oils, such as olive, canola, or sunflower oil. Avoid cooking with butter, cream, or  high-fat meats. Meal planning Eat meals and snacks regularly, preferably at the same times every day. Avoid going long periods of time without eating. Eat foods that are high in fiber, such as fresh fruits, vegetables, beans, and whole grains. Eat 4-6 oz (112-168 g) of lean protein each day, such as lean meat, chicken, fish, eggs, or tofu. One ounce (oz) (28 g) of lean protein is equal to: 1 oz (28 g) of meat, chicken, or fish. 1 egg.  cup (62 g) of tofu. Eat some foods each day that contain healthy fats, such as avocado, nuts, seeds, and fish. What foods should I eat? Fruits Berries. Apples. Oranges. Peaches. Apricots. Plums. Grapes. Mangoes. Papayas. Pomegranates. Kiwi. Cherries. Vegetables Leafy greens, including lettuce, spinach, kale, chard, collard greens, mustard greens, and cabbage. Beets. Cauliflower. Broccoli. Carrots. Green beans. Tomatoes. Peppers. Onions. Cucumbers. Brussels sprouts. Grains Whole grains, such as whole-wheat or whole-grain bread, crackers, tortillas, cereal, and pasta. Unsweetened oatmeal. Quinoa. Brown or wild rice. Meats and other proteins Seafood. Poultry without skin. Lean cuts of poultry and beef. Tofu. Nuts. Seeds. Dairy Low-fat or fat-free dairy products such as milk, yogurt, and cheese. The items listed above may not be a complete list of foods and beverages you can eat and drink. Contact a dietitian for more information. What foods should I avoid? Fruits Fruits canned with syrup. Vegetables Canned vegetables. Frozen vegetables with butter or cream sauce. Grains Refined white flour and flour products such as bread, pasta, snack foods, and cereals. Avoid all processed foods. Meats and other proteins Fatty cuts of meat. Poultry with skin. Breaded or fried meats. Processed meat. Avoid saturated fats. Dairy Full-fat yogurt, cheese, or milk. Beverages Sweetened drinks, such as soda or iced tea. The items listed above may not be a complete list of  foods and beverages you should avoid. Contact a dietitian for more information. Questions to ask a health care provider Do I need to meet with a certified diabetes care and education specialist? Do I need to meet with a dietitian? What number can I call if I have questions? When are the best times to check my blood glucose? Where to find more information: American Diabetes Association: diabetes.org Academy of Nutrition and Dietetics: eatright.[employer] of Diabetes and Digestive and Kidney Diseases: StageSync.si Association of Diabetes Care & Education Specialists: diabeteseducator.org Summary It is important to have healthy eating habits because your blood sugar (glucose) levels are greatly affected by what you eat and drink. It is important to use alcohol carefully. A healthy meal plan will help you manage your blood glucose and lower your risk of heart disease. Your health  care provider may recommend that you work with a dietitian to make a meal plan that is best for you. This information is not intended to replace advice given to you by your health care provider. Make sure you discuss any questions you have with your health care provider. Document Revised: 12/09/2019 Document Reviewed: 12/09/2019 Elsevier Patient Education  2024 Elsevier Inc.     Edwina Barth, MD  Primary [facility] at [facility]

## 2022-11-06 NOTE — Assessment & Plan Note (Signed)
Well-controlled on asymptomatic Continues 40 mg of Nexium as needed

## 2022-11-06 NOTE — Assessment & Plan Note (Signed)
Stable chronic condition Continue atorvastatin 10 mg daily Diet and nutrition discussed The 10-year ASCVD risk score (Arnett DK, et al., 2019) is: 11.7%   Values used to calculate the score:     Age: 62 years     Sex: Female     Is Non-Hispanic African American: Yes     Diabetic: Yes     Tobacco smoker: No     Systolic Blood Pressure: 128 mmHg     Is BP treated: No     HDL Cholesterol: 46.9 mg/dL     Total Cholesterol: 176 mg/dL

## 2022-11-06 NOTE — Assessment & Plan Note (Signed)
Well-controlled hypertension Off medications BP Readings from Last 3 Encounters:  11/06/22 128/80  06/28/22 130/70  03/08/22 130/74  Hemoglobin A1c still not at goal at 8.0 Recommend to continue Farxiga 10 mg daily and increase Mounjaro to 7.5 mg weekly Cardiovascular risks associated with uncontrolled diabetes discussed Diet and nutrition discussed Follow-up in 3 months

## 2022-12-10 ENCOUNTER — Other Ambulatory Visit: Payer: Self-pay | Admitting: Emergency Medicine

## 2022-12-11 MED ORDER — CYCLOBENZAPRINE HCL 10 MG PO TABS: 10.0000 mg | ORAL_TABLET | Freq: Three times a day (TID) | ORAL | 0 refills | Status: DC | PRN

## 2023-01-03 ENCOUNTER — Encounter (INDEPENDENT_AMBULATORY_CARE_PROVIDER_SITE_OTHER): Payer: Self-pay

## 2023-01-08 ENCOUNTER — Other Ambulatory Visit: Payer: Self-pay | Admitting: Emergency Medicine

## 2023-01-08 DIAGNOSIS — Z1231 Encounter for screening mammogram for malignant neoplasm of breast: Secondary | ICD-10-CM

## 2023-02-14 ENCOUNTER — Ambulatory Visit: Payer: BC Managed Care – PPO | Admitting: Emergency Medicine

## 2023-02-18 ENCOUNTER — Ambulatory Visit: Payer: BC Managed Care – PPO | Admitting: Emergency Medicine

## 2023-02-28 ENCOUNTER — Ambulatory Visit: Payer: BC Managed Care – PPO | Admitting: Emergency Medicine

## 2023-02-28 ENCOUNTER — Encounter: Payer: Self-pay | Admitting: Emergency Medicine

## 2023-02-28 VITALS — BP 118/78 | HR 88 | Temp 98.5°F | Ht 63.0 in | Wt 185.4 lb

## 2023-02-28 DIAGNOSIS — K219 Gastro-esophageal reflux disease without esophagitis: Secondary | ICD-10-CM | POA: Diagnosis not present

## 2023-02-28 DIAGNOSIS — Z7984 Long term (current) use of oral hypoglycemic drugs: Secondary | ICD-10-CM

## 2023-02-28 DIAGNOSIS — I152 Hypertension secondary to endocrine disorders: Secondary | ICD-10-CM | POA: Diagnosis not present

## 2023-02-28 DIAGNOSIS — M7989 Other specified soft tissue disorders: Secondary | ICD-10-CM | POA: Diagnosis not present

## 2023-02-28 DIAGNOSIS — E785 Hyperlipidemia, unspecified: Secondary | ICD-10-CM

## 2023-02-28 DIAGNOSIS — E1159 Type 2 diabetes mellitus with other circulatory complications: Secondary | ICD-10-CM | POA: Diagnosis not present

## 2023-02-28 DIAGNOSIS — E1169 Type 2 diabetes mellitus with other specified complication: Secondary | ICD-10-CM | POA: Diagnosis not present

## 2023-02-28 DIAGNOSIS — Z7985 Long-term (current) use of injectable non-insulin antidiabetic drugs: Secondary | ICD-10-CM

## 2023-02-28 LAB — POCT GLYCOSYLATED HEMOGLOBIN (HGB A1C): Hemoglobin A1C: 6.5 % — AB (ref 4.0–5.6)

## 2023-02-28 NOTE — Progress Notes (Signed)
Karen [name] 61 y.o.   Chief Complaint  Patient presents with   Follow-up    3 month follow up    HISTORY OF PRESENT ILLNESS: This is a 62 y.o. female here for 17-month follow-up of diabetes Overall doing well.  Eating better and losing weight. Noticed a lump near right knee which is now gone away after 3 months.  Denies injury. Lab Results  Component Value Date   HGBA1C 8.0 (A) 11/06/2022   Wt Readings from Last 3 Encounters:  02/28/23 185 lb 6.4 oz (84.1 kg)  11/06/22 193 lb 9.6 oz (87.8 kg)  06/28/22 200 lb 2 oz (90.8 kg)      HPI   Prior to Admission medications   Medication Sig Start Date End Date Taking? Authorizing Provider  cetirizine (ZYRTEC) 10 MG tablet Take 1 tablet (10 mg total) by mouth daily. 03/08/22  Yes Caysie Minnifield, Eilleen Kempf, MD  cyclobenzaprine (FLEXERIL) 10 MG tablet Take 1 tablet (10 mg total) by mouth 3 (three) times daily as needed. for muscle spams 12/11/22  Yes Shalene Gallen, Eilleen Kempf, MD  dapagliflozin propanediol (FARXIGA) 10 MG TABS tablet Take 10 mg by mouth daily.   Yes [provider]  esomeprazole (NEXIUM) 40 MG capsule Take 1 capsule (40 mg total) by mouth daily. 07/06/21  Yes Ceili Boshers, Eilleen Kempf, MD  methocarbamol (ROBAXIN) 500 MG tablet Take 1 tablet (500 mg total) by mouth 2 (two) times daily. 08/21/22  Yes Erielle Gawronski, Eilleen Kempf, MD  SUMAtriptan (IMITREX) 50 MG tablet Take 1 tablet (50 mg total) by mouth every 2 (two) hours as needed for migraine (Max 2 doses daily). May repeat in 2 hours if headache persists or recurs. 11/06/22  Yes Ric Rosenberg, Eilleen Kempf, MD  tirzepatide [facility]) 7.5 MG/0.5ML Pen Inject 7.5 mg into the skin once a week. 11/06/22  Yes Jden Want, Eilleen Kempf, MD  atorvastatin (LIPITOR) 10 MG tablet Take 1 tablet (10 mg total) by mouth daily. Patient not taking: Reported on 02/28/2023 08/21/22   Georgina Quint, MD  blood glucose meter kit and supplies KIT Dispense based on patient and insurance preference. Use up  to four times daily as directed. Patient not taking: Reported on 02/28/2023 09/05/20   Georgina Quint, MD  blood glucose meter kit and supplies Per insurance coverage. Check blood glucose once a day. Dx E11.65 Patient not taking: Reported on 02/28/2023 02/15/20   Lezlie Lye, Meda Coffee, MD  linaclotide [facility]) 145 MCG CAPS capsule Take 1 capsule (145 mcg total) by mouth daily before breakfast. Patient not taking: Reported on 02/28/2023 03/01/21   Georgina Quint, MD    Allergies  Allergen Reactions   Orange Fruit [Citrus] Hives   Penicillins Hives    Patient Active Problem List   Diagnosis Date Noted   Intractable migraine without status migrainosus 11/06/2022   Chronic constipation 03/01/2021   Hyperlipidemia associated with type 2 diabetes mellitus (HCC) 12/30/2018   Seasonal allergies 04/19/2017   Gastroesophageal reflux disease 04/19/2017   Diabetes mellitus (HCC) 02/14/2012   Hypertension associated with diabetes (HCC) 02/14/2012    Past Medical History:  Diagnosis Date   Allergy    Anemia    past hx in her 20's   Diabetes mellitus without complication (HCC)    GERD (gastroesophageal reflux disease)    Hyperlipidemia    Controlled    Hypertension    Controlled     Past Surgical History:  Procedure Laterality Date   ABDOMINAL HYSTERECTOMY     PID; ovaries  resected.  Age 21.   APPENDECTOMY      Social History   Socioeconomic History   Marital status: Divorced    Spouse name: Not on file   Number of children: Not on file   Years of education: Not on file   Highest education level: 12th grade  Occupational History   Not on file  Tobacco Use   Smoking status: Former    Current packs/day: 0.70    Average packs/day: 0.7 packs/day for 30.0 years (21.0 ttl pk-yrs)    Types: Cigarettes   Smokeless tobacco: Never  Vaping Use   Vaping status: Never Used  Substance and Sexual Activity   Alcohol use: No   Drug use: No   Sexual activity: Yes     Birth control/protection: None  Other Topics Concern   Not on file  Social History Narrative   Marital status: divorced; not dating      Children:  None      Lives: alone      Employment: International aid/development worker       Tobacco: sometimes; smokes 1 pack per week       Alcohol:  None      Drugs: none      Exercise:  sporadic   Social Determinants of Health   Financial Resource Strain: Low Risk  (11/05/2022)   Overall Financial Resource Strain (CARDIA)    Difficulty of Paying Living Expenses: Not hard at all  Food Insecurity: No Food Insecurity (11/05/2022)   Hunger Vital Sign    Worried About Running Out of Food in the Last Year: Never true    Ran Out of Food in the Last Year: Never true  Transportation Needs: No Transportation Needs (11/05/2022)   PRAPARE - Administrator, Civil Service (Medical): No    Lack of Transportation (Non-Medical): No  Physical Activity: Insufficiently Active (11/05/2022)   Exercise Vital Sign    Days of Exercise per Week: 3 days    Minutes of Exercise per Session: 30 min  Stress: No Stress Concern Present (11/05/2022)   [employer] of Occupational Health - Occupational Stress Questionnaire    Feeling of Stress : Not at all  Social Connections: Moderately Isolated (11/05/2022)   Social Connection and Isolation Panel [NHANES]    Frequency of Communication with Friends and Family: More than three times a week    Frequency of Social Gatherings with Friends and Family: More than three times a week    Attends Religious Services: 1 to 4 times per year    Active Member of [employer] or Organizations: No    Attends Engineer, structural: Not on file    Marital Status: Divorced  Catering manager Violence: Not on file    Family History  Problem Relation Age of Onset   Cancer Mother 17       uterine cancer   Uterine cancer Mother    Heart disease Sister 42       AMI/CAD with stenting   Hypertension Sister    Breast cancer Maternal Aunt     Colon cancer Neg Hx    Colon polyps Neg Hx    Esophageal cancer Neg Hx    Stomach cancer Neg Hx    Rectal cancer Neg Hx      Review of Systems  Constitutional: Negative.  Negative for chills and fever.  HENT: Negative.  Negative for congestion and sore throat.   Respiratory: Negative.  Negative for cough and shortness of breath.  Cardiovascular: Negative.  Negative for chest pain and palpitations.  Gastrointestinal:  Negative for abdominal pain, diarrhea, nausea and vomiting.  Genitourinary: Negative.  Negative for dysuria and hematuria.  Skin: Negative.  Negative for rash.  Neurological: Negative.  Negative for dizziness and headaches.  All other systems reviewed and are negative.   Vitals:   02/28/23 1544  BP: 118/78  Pulse: 88  Temp: 98.5 F (36.9 C)  SpO2: 95%    Physical Exam Vitals reviewed.  Constitutional:      Appearance: Normal appearance.  HENT:     Head: Normocephalic.     Mouth/Throat:     Mouth: Mucous membranes are moist.     Pharynx: Oropharynx is clear.  Eyes:     Extraocular Movements: Extraocular movements intact.     Conjunctiva/sclera: Conjunctivae normal.     Pupils: Pupils are equal, round, and reactive to light.  Cardiovascular:     Rate and Rhythm: Normal rate and regular rhythm.     Pulses: Normal pulses.     Heart sounds: Normal heart sounds.  Pulmonary:     Effort: Pulmonary effort is normal.     Breath sounds: Normal breath sounds.  Musculoskeletal:     Cervical back: No tenderness.  Lymphadenopathy:     Cervical: No cervical adenopathy.  Skin:    General: Skin is warm and dry.     Capillary Refill: Capillary refill takes less than 2 seconds.  Neurological:     General: No focal deficit present.     Mental Status: She is alert and oriented to person, place, and time.  Psychiatric:        Mood and Affect: Mood normal.        Behavior: Behavior normal.     Results for orders placed or performed in visit on 02/28/23 (from the  past 24 hour(s))  POCT HgB A1C     Status: Abnormal   Collection Time: 02/28/23  4:18 PM  Result Value Ref Range   Hemoglobin A1C 6.5 (A) 4.0 - 5.6 %   HbA1c POC (<> result, manual entry)     HbA1c, POC (prediabetic range)     HbA1c, POC (controlled diabetic range)      ASSESSMENT & PLAN: A total of 46 minutes was spent with the patient and counseling/coordination of care regarding preparing for this visit, review of most recent office visit notes, review of multiple chronic medical conditions under management, cardiovascular risks associated with hypertension and diabetes, review of most recent blood work results including interpretation of today's hemoglobin A1c, review of all medications, education on nutrition, prognosis, documentation, and need for follow-up.  Problem List Items Addressed This Visit       Cardiovascular and Mediastinum   Hypertension associated with diabetes (HCC) - Primary    BP Readings from Last 3 Encounters:  02/28/23 118/78  11/06/22 128/80  06/28/22 130/70  Well-controlled hypertension off medications Well-controlled diabetes with hemoglobin A1c of 6.5 Continue Farxiga 10 mg daily and weekly Mounjaro 7.5 mg Eating better and losing weight Cardiovascular risks associated with diabetes discussed Benefits of exercise discussed Will follow-up in 6 months       Relevant Orders   POCT HgB A1C (Completed)     Digestive   Gastroesophageal reflux disease    Stable and well-controlled.  Asymptomatic. Continues Nexium 40 mg daily        Endocrine   Hyperlipidemia associated with type 2 diabetes mellitus (HCC)    Stable chronic condition Hemoglobin A1c is  6.5 Not taking atorvastatin as prescribed. The 10-year ASCVD risk score (Arnett DK, et al., 2019) is: 9.3%   Values used to calculate the score:     Age: 25 years     Sex: Female     Is Non-Hispanic African American: Yes     Diabetic: Yes     Tobacco smoker: No     Systolic Blood Pressure: 118  mmHg     Is BP treated: No     HDL Cholesterol: 46.9 mg/dL     Total Cholesterol: 176 mg/dL       Relevant Orders   POCT HgB A1C (Completed)     Other   Mass of soft tissue of knee    Nontender lump to medial aspect of lower knee Recommend soft tissue ultrasound to further evaluate      Relevant Orders   [place] RT LOWER EXTREM LTD SOFT TISSUE NON VASCULAR     Patient Instructions  Diabetes Mellitus and Nutrition, Adult When you have diabetes, or diabetes mellitus, it is very important to have healthy eating habits because your blood sugar (glucose) levels are greatly affected by what you eat and drink. Eating healthy foods in the right amounts, at about the same times every day, can help you: Manage your blood glucose. Lower your risk of heart disease. Improve your blood pressure. Reach or maintain a healthy weight. What can affect my meal plan? Every person with diabetes is different, and each person has different needs for a meal plan. Your health care provider may recommend that you work with a dietitian to make a meal plan that is best for you. Your meal plan may vary depending on factors such as: The calories you need. The medicines you take. Your weight. Your blood glucose, blood pressure, and cholesterol levels. Your activity level. Other health conditions you have, such as heart or kidney disease. How do carbohydrates affect me? Carbohydrates, also called carbs, affect your blood glucose level more than any other type of food. Eating carbs raises the amount of glucose in your blood. It is important to know how many carbs you can safely have in each meal. This is different for every person. Your dietitian can help you calculate how many carbs you should have at each meal and for each snack. How does alcohol affect me? Alcohol can cause a decrease in blood glucose (hypoglycemia), especially if you use insulin or take certain diabetes medicines by mouth. Hypoglycemia can be a  life-threatening condition. Symptoms of hypoglycemia, such as sleepiness, dizziness, and confusion, are similar to symptoms of having too much alcohol. Do not drink alcohol if: Your health care provider tells you not to drink. You are pregnant, may be pregnant, or are planning to become pregnant. If you drink alcohol: Limit how much you have to: 0-1 drink a day for women. 0-2 drinks a day for men. Know how much alcohol is in your drink. In the [place] one drink equals one 12 oz bottle of beer (355 mL), one 5 oz glass of wine (148 mL), or one 1 oz glass of hard liquor (44 mL). Keep yourself hydrated with water, diet soda, or unsweetened iced tea. Keep in mind that regular soda, juice, and other mixers may contain a lot of sugar and must be counted as carbs. What are tips for following this plan?  Reading food labels Start by checking the serving size on the Nutrition Facts label of packaged foods and drinks. The number of calories and the  amount of carbs, fats, and other nutrients listed on the label are based on one serving of the item. Many items contain more than one serving per package. Check the total grams (g) of carbs in one serving. Check the number of grams of saturated fats and trans fats in one serving. Choose foods that have a low amount or none of these fats. Check the number of milligrams (mg) of salt (sodium) in one serving. Most people should limit total sodium intake to less than 2,300 mg per day. Always check the nutrition information of foods labeled as "low-fat" or "nonfat." These foods may be higher in added sugar or refined carbs and should be avoided. Talk to your dietitian to identify your daily goals for nutrients listed on the label. Shopping Avoid buying canned, pre-made, or processed foods. These foods tend to be high in fat, sodium, and added sugar. Shop around the outside edge of the grocery store. This is where you will most often find fresh fruits and vegetables,  bulk grains, fresh meats, and fresh dairy products. Cooking Use low-heat cooking methods, such as baking, instead of high-heat cooking methods, such as deep frying. Cook using healthy oils, such as olive, canola, or sunflower oil. Avoid cooking with butter, cream, or high-fat meats. Meal planning Eat meals and snacks regularly, preferably at the same times every day. Avoid going long periods of time without eating. Eat foods that are high in fiber, such as fresh fruits, vegetables, beans, and whole grains. Eat 4-6 oz (112-168 g) of lean protein each day, such as lean meat, chicken, fish, eggs, or tofu. One ounce (oz) (28 g) of lean protein is equal to: 1 oz (28 g) of meat, chicken, or fish. 1 egg.  cup (62 g) of tofu. Eat some foods each day that contain healthy fats, such as avocado, nuts, seeds, and fish. What foods should I eat? Fruits Berries. Apples. Oranges. Peaches. Apricots. Plums. Grapes. Mangoes. Papayas. Pomegranates. Kiwi. Cherries. Vegetables Leafy greens, including lettuce, spinach, kale, chard, collard greens, mustard greens, and cabbage. Beets. Cauliflower. Broccoli. Carrots. Green beans. Tomatoes. Peppers. Onions. Cucumbers. Brussels sprouts. Grains Whole grains, such as whole-wheat or whole-grain bread, crackers, tortillas, cereal, and pasta. Unsweetened oatmeal. Quinoa. Brown or wild rice. Meats and other proteins Seafood. Poultry without skin. Lean cuts of poultry and beef. Tofu. Nuts. Seeds. Dairy Low-fat or fat-free dairy products such as milk, yogurt, and cheese. The items listed above may not be a complete list of foods and beverages you can eat and drink. Contact a dietitian for more information. What foods should I avoid? Fruits Fruits canned with syrup. Vegetables Canned vegetables. Frozen vegetables with butter or cream sauce. Grains Refined white flour and flour products such as bread, pasta, snack foods, and cereals. Avoid all processed foods. Meats and  other proteins Fatty cuts of meat. Poultry with skin. Breaded or fried meats. Processed meat. Avoid saturated fats. Dairy Full-fat yogurt, cheese, or milk. Beverages Sweetened drinks, such as soda or iced tea. The items listed above may not be a complete list of foods and beverages you should avoid. Contact a dietitian for more information. Questions to ask a health care provider Do I need to meet with a certified diabetes care and education specialist? Do I need to meet with a dietitian? What number can I call if I have questions? When are the best times to check my blood glucose? Where to find more information: American Diabetes Association: diabetes.org Academy of Nutrition and Dietetics: eatright.[employer]  of Diabetes and Digestive and Kidney Diseases: StageSync.si Association of Diabetes Care & Education Specialists: diabeteseducator.org Summary It is important to have healthy eating habits because your blood sugar (glucose) levels are greatly affected by what you eat and drink. It is important to use alcohol carefully. A healthy meal plan will help you manage your blood glucose and lower your risk of heart disease. Your health care provider may recommend that you work with a dietitian to make a meal plan that is best for you. This information is not intended to replace advice given to you by your health care provider. Make sure you discuss any questions you have with your health care provider. Document Revised: 12/09/2019 Document Reviewed: 12/09/2019 Elsevier Patient Education  2024 Elsevier Inc.     Edwina Barth, MD [facility]

## 2023-02-28 NOTE — Assessment & Plan Note (Signed)
Stable chronic condition Hemoglobin A1c is 6.5 Not taking atorvastatin as prescribed. The 10-year ASCVD risk score (Arnett DK, et al., 2019) is: 9.3%   Values used to calculate the score:     Age: 62 years     Sex: Female     Is Non-Hispanic African American: Yes     Diabetic: Yes     Tobacco smoker: No     Systolic Blood Pressure: 118 mmHg     Is BP treated: No     HDL Cholesterol: 46.9 mg/dL     Total Cholesterol: 176 mg/dL

## 2023-02-28 NOTE — Assessment & Plan Note (Signed)
Stable and well-controlled.  Asymptomatic. Continues Nexium 40 mg daily

## 2023-02-28 NOTE — Patient Instructions (Signed)

## 2023-02-28 NOTE — Assessment & Plan Note (Signed)
BP Readings from Last 3 Encounters:  02/28/23 118/78  11/06/22 128/80  06/28/22 130/70  Well-controlled hypertension off medications Well-controlled diabetes with hemoglobin A1c of 6.5 Continue Farxiga 10 mg daily and weekly Mounjaro 7.5 mg Eating better and losing weight Cardiovascular risks associated with diabetes discussed Benefits of exercise discussed Will follow-up in 6 months

## 2023-02-28 NOTE — Assessment & Plan Note (Signed)
Nontender lump to medial aspect of lower knee Recommend soft tissue ultrasound to further evaluate

## 2023-03-04 ENCOUNTER — Other Ambulatory Visit: Payer: Self-pay | Admitting: Emergency Medicine

## 2023-03-04 ENCOUNTER — Other Ambulatory Visit: Payer: BC Managed Care – PPO

## 2023-03-04 MED ORDER — CYCLOBENZAPRINE HCL 10 MG PO TABS: 10.0000 mg | ORAL_TABLET | Freq: Three times a day (TID) | ORAL | 0 refills | Status: DC | PRN

## 2023-03-07 ENCOUNTER — Ambulatory Visit: Payer: BC Managed Care – PPO

## 2023-03-08 ENCOUNTER — Ambulatory Visit
Admission: RE | Admit: 2023-03-08 | Discharge: 2023-03-08 | Disposition: A | Discharge status: Home / Self Care | Payer: BC Managed Care – PPO | Source: Ambulatory Visit | Attending: Emergency Medicine

## 2023-03-08 DIAGNOSIS — M7989 Other specified soft tissue disorders: Secondary | ICD-10-CM

## 2023-03-14 ENCOUNTER — Ambulatory Visit
Admission: RE | Admit: 2023-03-14 | Discharge: 2023-03-14 | Disposition: A | Discharge status: Home / Self Care | Payer: BC Managed Care – PPO | Source: Ambulatory Visit | Attending: Emergency Medicine | Admitting: Emergency Medicine

## 2023-03-14 DIAGNOSIS — Z1231 Encounter for screening mammogram for malignant neoplasm of breast: Secondary | ICD-10-CM

## 2023-03-26 ENCOUNTER — Ambulatory Visit: Payer: BC Managed Care – PPO | Admitting: Emergency Medicine

## 2023-03-26 ENCOUNTER — Ambulatory Visit: Payer: BC Managed Care – PPO

## 2023-03-26 ENCOUNTER — Encounter: Payer: Self-pay | Admitting: Emergency Medicine

## 2023-03-26 VITALS — BP 130/78 | HR 102 | Temp 98.2°F | Ht 63.0 in | Wt 187.0 lb

## 2023-03-26 DIAGNOSIS — E1169 Type 2 diabetes mellitus with other specified complication: Secondary | ICD-10-CM

## 2023-03-26 DIAGNOSIS — E785 Hyperlipidemia, unspecified: Secondary | ICD-10-CM

## 2023-03-26 DIAGNOSIS — I152 Hypertension secondary to endocrine disorders: Secondary | ICD-10-CM

## 2023-03-26 DIAGNOSIS — Z111 Encounter for screening for respiratory tuberculosis: Secondary | ICD-10-CM

## 2023-03-26 DIAGNOSIS — E1159 Type 2 diabetes mellitus with other circulatory complications: Secondary | ICD-10-CM | POA: Diagnosis not present

## 2023-03-26 DIAGNOSIS — Z7985 Long-term (current) use of injectable non-insulin antidiabetic drugs: Secondary | ICD-10-CM

## 2023-03-26 NOTE — Assessment & Plan Note (Signed)
BP Readings from Last 3 Encounters:  02/28/23 118/78  11/06/22 128/80  06/28/22 130/70  Well-controlled hypertension off medications Well-controlled diabetes with hemoglobin A1c of 6.5 Continue Farxiga 10 mg daily and weekly Mounjaro 7.5 mg Eating better and losing weight Cardiovascular risks associated with diabetes discussed Benefits of exercise discussed Will follow-up in 6 months

## 2023-03-26 NOTE — Assessment & Plan Note (Signed)
Stable chronic condition Hemoglobin A1c is 6.5 Not taking atorvastatin as prescribed. The 10-year ASCVD risk score (Arnett DK, et al., 2019) is: 9.3%   Values used to calculate the score:     Age: 62 years     Sex: Female     Is Non-Hispanic African American: Yes     Diabetic: Yes     Tobacco smoker: No     Systolic Blood Pressure: 118 mmHg     Is BP treated: No     HDL Cholesterol: 46.9 mg/dL     Total Cholesterol: 176 mg/dL

## 2023-03-26 NOTE — Patient Instructions (Signed)

## 2023-03-26 NOTE — Progress Notes (Signed)
Karen [name] 62 y.o.   Chief Complaint  Patient presents with   Medical Management of Chronic Issues    Patient states she needs an x ray for her job. She states she can't get the PPD skin test due to her being allergic     HISTORY OF PRESENT ILLNESS: This is a 62 y.o. female needs to get a chest x-ray required by work.  Unable to get PPD skin test for tuberculosis screening. Asymptomatic. No other complaints or medical concerns today.  HPI   Prior to Admission medications   Medication Sig Start Date End Date Taking? Authorizing Provider  cetirizine (ZYRTEC) 10 MG tablet Take 1 tablet (10 mg total) by mouth daily. 03/08/22  Yes Tami Blass, Eilleen Kempf, MD  cyclobenzaprine (FLEXERIL) 10 MG tablet Take 1 tablet (10 mg total) by mouth 3 (three) times daily as needed. for muscle spams 03/04/23  Yes Otto Caraway, Eilleen Kempf, MD  dapagliflozin propanediol (FARXIGA) 10 MG TABS tablet Take 10 mg by mouth daily.   Yes [provider]  esomeprazole (NEXIUM) 40 MG capsule Take 1 capsule (40 mg total) by mouth daily. 07/06/21  Yes Matheau Orona, Eilleen Kempf, MD  methocarbamol (ROBAXIN) 500 MG tablet Take 1 tablet (500 mg total) by mouth 2 (two) times daily. 08/21/22  Yes Alyssabeth Bruster, Eilleen Kempf, MD  SUMAtriptan (IMITREX) 50 MG tablet Take 1 tablet (50 mg total) by mouth every 2 (two) hours as needed for migraine (Max 2 doses daily). May repeat in 2 hours if headache persists or recurs. 11/06/22  Yes Khasir Woodrome, Eilleen Kempf, MD  tirzepatide [facility]) 7.5 MG/0.5ML Pen Inject 7.5 mg into the skin once a week. 11/06/22  Yes June Vacha, Eilleen Kempf, MD  atorvastatin (LIPITOR) 10 MG tablet Take 1 tablet (10 mg total) by mouth daily. Patient not taking: Reported on 02/28/2023 08/21/22   Georgina Quint, MD  blood glucose meter kit and supplies KIT Dispense based on patient and insurance preference. Use up to four times daily as directed. Patient not taking: Reported on 02/28/2023 09/05/20   Georgina Quint, MD  blood glucose meter kit and supplies Per insurance coverage. Check blood glucose once a day. Dx E11.65 Patient not taking: Reported on 02/28/2023 02/15/20   Lezlie Lye, Meda Coffee, MD  linaclotide [facility]) 145 MCG CAPS capsule Take 1 capsule (145 mcg total) by mouth daily before breakfast. Patient not taking: Reported on 02/28/2023 03/01/21   Georgina Quint, MD    Allergies  Allergen Reactions   Orange Fruit [Citrus] Hives   Penicillins Hives    Patient Active Problem List   Diagnosis Date Noted   Mass of soft tissue of knee 02/28/2023   Intractable migraine without status migrainosus 11/06/2022   Chronic constipation 03/01/2021   Hyperlipidemia associated with type 2 diabetes mellitus (HCC) 12/30/2018   Seasonal allergies 04/19/2017   Gastroesophageal reflux disease 04/19/2017   Diabetes mellitus (HCC) 02/14/2012   Hypertension associated with diabetes (HCC) 02/14/2012    Past Medical History:  Diagnosis Date   Allergy    Anemia    past hx in her 20's   Diabetes mellitus without complication (HCC)    GERD (gastroesophageal reflux disease)    Hyperlipidemia    Controlled    Hypertension    Controlled     Past Surgical History:  Procedure Laterality Date   ABDOMINAL HYSTERECTOMY     PID; ovaries resected.  Age 42.   APPENDECTOMY      Social History   Socioeconomic History  Marital status: Divorced    Spouse name: Not on file   Number of children: Not on file   Years of education: Not on file   Highest education level: 12th grade  Occupational History   Not on file  Tobacco Use   Smoking status: Former    Current packs/day: 0.70    Average packs/day: 0.7 packs/day for 30.0 years (21.0 ttl pk-yrs)    Types: Cigarettes   Smokeless tobacco: Never  Vaping Use   Vaping status: Never Used  Substance and Sexual Activity   Alcohol use: No   Drug use: No   Sexual activity: Yes    Birth control/protection: None  Other Topics Concern   Not on  file  Social History Narrative   Marital status: divorced; not dating      Children:  None      Lives: alone      Employment: International aid/development worker       Tobacco: sometimes; smokes 1 pack per week       Alcohol:  None      Drugs: none      Exercise:  sporadic   Social Determinants of Health   Financial Resource Strain: Low Risk  (11/05/2022)   Overall Financial Resource Strain (CARDIA)    Difficulty of Paying Living Expenses: Not hard at all  Food Insecurity: No Food Insecurity (11/05/2022)   Hunger Vital Sign    Worried About Running Out of Food in the Last Year: Never true    Ran Out of Food in the Last Year: Never true  Transportation Needs: No Transportation Needs (11/05/2022)   PRAPARE - Administrator, Civil Service (Medical): No    Lack of Transportation (Non-Medical): No  Physical Activity: Insufficiently Active (11/05/2022)   Exercise Vital Sign    Days of Exercise per Week: 3 days    Minutes of Exercise per Session: 30 min  Stress: No Stress Concern Present (11/05/2022)   [employer] of Occupational Health - Occupational Stress Questionnaire    Feeling of Stress : Not at all  Social Connections: Moderately Isolated (11/05/2022)   Social Connection and Isolation Panel [NHANES]    Frequency of Communication with Friends and Family: More than three times a week    Frequency of Social Gatherings with Friends and Family: More than three times a week    Attends Religious Services: 1 to 4 times per year    Active Member of [employer] or Organizations: No    Attends Engineer, structural: Not on file    Marital Status: Divorced  Catering manager Violence: Not on file    Family History  Problem Relation Age of Onset   Cancer Mother 57       uterine cancer   Uterine cancer Mother    Heart disease Sister 21       AMI/CAD with stenting   Hypertension Sister    Breast cancer Maternal Aunt    Colon cancer Neg Hx    Colon polyps Neg Hx    Esophageal cancer  Neg Hx    Stomach cancer Neg Hx    Rectal cancer Neg Hx      Review of Systems  Constitutional: Negative.  Negative for chills and fever.  HENT: Negative.  Negative for congestion and sore throat.   Respiratory: Negative.  Negative for cough and shortness of breath.   Cardiovascular: Negative.  Negative for chest pain and palpitations.  Gastrointestinal:  Negative for abdominal pain, diarrhea, nausea  and vomiting.  Genitourinary: Negative.  Negative for dysuria and hematuria.  Skin: Negative.  Negative for rash.  Neurological: Negative.  Negative for dizziness and headaches.  All other systems reviewed and are negative.   Vitals:   03/26/23 1530  BP: 130/78  Pulse: (!) 102  Temp: 98.2 F (36.8 C)  SpO2: 96%    Physical Exam Vitals reviewed.  Constitutional:      Appearance: Normal appearance.  HENT:     Head: Normocephalic.  Eyes:     Extraocular Movements: Extraocular movements intact.  Cardiovascular:     Rate and Rhythm: Normal rate and regular rhythm.     Pulses: Normal pulses.     Heart sounds: Normal heart sounds.  Pulmonary:     Effort: Pulmonary effort is normal.     Breath sounds: Normal breath sounds.  Musculoskeletal:     Cervical back: No tenderness.  Lymphadenopathy:     Cervical: No cervical adenopathy.  Neurological:     General: No focal deficit present.     Mental Status: She is alert and oriented to person, place, and time.  Psychiatric:        Mood and Affect: Mood normal.        Behavior: Behavior normal.      ASSESSMENT & PLAN: A total of 32 minutes was spent with the patient and counseling/coordination of care regarding preparing for this visit, review of most recent office visit notes, review of multiple chronic medical conditions under management, review of most recent blood work results, review of all medications, cardiovascular risks associated with hypertension and diabetes, prognosis, documentation and need for follow-up.  Problem  List Items Addressed This Visit       Cardiovascular and Mediastinum   Hypertension associated with diabetes (HCC) - Primary       BP Readings from Last 3 Encounters:  02/28/23 118/78  11/06/22 128/80  06/28/22 130/70  Well-controlled hypertension off medications Well-controlled diabetes with hemoglobin A1c of 6.5 Continue Farxiga 10 mg daily and weekly Mounjaro 7.5 mg Eating better and losing weight Cardiovascular risks associated with diabetes discussed Benefits of exercise discussed Will follow-up in 6 months        Endocrine   Hyperlipidemia associated with type 2 diabetes mellitus (HCC)    Stable chronic condition Hemoglobin A1c is 6.5 Not taking atorvastatin as prescribed. The 10-year ASCVD risk score (Arnett DK, et al., 2019) is: 9.3%   Values used to calculate the score:     Age: 59 years     Sex: Female     Is Non-Hispanic African American: Yes     Diabetic: Yes     Tobacco smoker: No     Systolic Blood Pressure: 118 mmHg     Is BP treated: No     HDL Cholesterol: 46.9 mg/dL     Total Cholesterol: 176 mg/dL      Other Visit Diagnoses     Tuberculosis screening       Relevant Orders   DG Chest 2 View      Patient Instructions  Health Maintenance, Female Adopting a healthy lifestyle and getting preventive care are important in promoting health and wellness. Ask your health care provider about: The right schedule for you to have regular tests and exams. Things you can do on your own to prevent diseases and keep yourself healthy. What should I know about diet, weight, and exercise? Eat a healthy diet  Eat a diet that includes plenty of vegetables, fruits, low-fat  dairy products, and lean protein. Do not eat a lot of foods that are high in solid fats, added sugars, or sodium. Maintain a healthy weight Body mass index (BMI) is used to identify weight problems. It estimates body fat based on height and weight. Your health care provider can help determine  your BMI and help you achieve or maintain a healthy weight. Get regular exercise Get regular exercise. This is one of the most important things you can do for your health. Most adults should: Exercise for at least 150 minutes each week. The exercise should increase your heart rate and make you sweat (moderate-intensity exercise). Do strengthening exercises at least twice a week. This is in addition to the moderate-intensity exercise. Spend less time sitting. Even light physical activity can be beneficial. Watch cholesterol and blood lipids Have your blood tested for lipids and cholesterol at 61 years of age, then have this test every 5 years. Have your cholesterol levels checked more often if: Your lipid or cholesterol levels are high. You are older than 61 years of age. You are at high risk for heart disease. What should I know about cancer screening? Depending on your health history and family history, you may need to have cancer screening at various ages. This may include screening for: Breast cancer. Cervical cancer. Colorectal cancer. Skin cancer. Lung cancer. What should I know about heart disease, diabetes, and high blood pressure? Blood pressure and heart disease High blood pressure causes heart disease and increases the risk of stroke. This is more likely to develop in people who have high blood pressure readings or are overweight. Have your blood pressure checked: Every 3-5 years if you are 44-66 years of age. Every year if you are 35 years old or older. Diabetes Have regular diabetes screenings. This checks your fasting blood sugar level. Have the screening done: Once every three years after age 29 if you are at a normal weight and have a low risk for diabetes. More often and at a younger age if you are overweight or have a high risk for diabetes. What should I know about preventing infection? Hepatitis B If you have a higher risk for hepatitis B, you should be screened for  this virus. Talk with your health care provider to find out if you are at risk for hepatitis B infection. Hepatitis C Testing is recommended for: Everyone born from [birth year] through 1964. Anyone with known risk factors for hepatitis C. Sexually transmitted infections (STIs) Get screened for STIs, including gonorrhea and chlamydia, if: You are sexually active and are younger than 61 years of age. You are older than 61 years of age and your health care provider tells you that you are at risk for this type of infection. Your sexual activity has changed since you were last screened, and you are at increased risk for chlamydia or gonorrhea. Ask your health care provider if you are at risk. Ask your health care provider about whether you are at high risk for HIV. Your health care provider may recommend a prescription medicine to help prevent HIV infection. If you choose to take medicine to prevent HIV, you should first get tested for HIV. You should then be tested every 3 months for as long as you are taking the medicine. Pregnancy If you are about to stop having your period (premenopausal) and you may become pregnant, seek counseling before you get pregnant. Take 400 to 800 micrograms (mcg) of folic acid every day if you become pregnant. Ask  for birth control (contraception) if you want to prevent pregnancy. Osteoporosis and menopause Osteoporosis is a disease in which the bones lose minerals and strength with aging. This can result in bone fractures. If you are 55 years old or older, or if you are at risk for osteoporosis and fractures, ask your health care provider if you should: Be screened for bone loss. Take a calcium or vitamin D supplement to lower your risk of fractures. Be given hormone replacement therapy (HRT) to treat symptoms of menopause. Follow these instructions at home: Alcohol use Do not drink alcohol if: Your health care provider tells you not to drink. You are pregnant, may be  pregnant, or are planning to become pregnant. If you drink alcohol: Limit how much you have to: 0-1 drink a day. Know how much alcohol is in your drink. In the [place] one drink equals one 12 oz bottle of beer (355 mL), one 5 oz glass of wine (148 mL), or one 1 oz glass of hard liquor (44 mL). Lifestyle Do not use any products that contain nicotine or tobacco. These products include cigarettes, chewing tobacco, and vaping devices, such as e-cigarettes. If you need help quitting, ask your health care provider. Do not use street drugs. Do not share needles. Ask your health care provider for help if you need support or information about quitting drugs. General instructions Schedule regular health, dental, and eye exams. Stay current with your vaccines. Tell your health care provider if: You often feel depressed. You have ever been abused or do not feel safe at home. Summary Adopting a healthy lifestyle and getting preventive care are important in promoting health and wellness. Follow your health care provider's instructions about healthy diet, exercising, and getting tested or screened for diseases. Follow your health care provider's instructions on monitoring your cholesterol and blood pressure. This information is not intended to replace advice given to you by your health care provider. Make sure you discuss any questions you have with your health care provider. Document Revised: 09/26/2020 Document Reviewed: 09/26/2020 Elsevier Patient Education  2024 Elsevier Inc.     Edwina Barth, MD [facility]

## 2023-07-08 ENCOUNTER — Ambulatory Visit: Payer: Self-pay | Admitting: Emergency Medicine

## 2023-07-08 NOTE — Telephone Encounter (Signed)
  Chief Complaint: body aches Symptoms: body aches, headache, pain on roof of mouth, ear fullness Frequency: 3 days- felt ill for 6 days, resolved and returned. Pertinent Negatives: Patient denies CP, SOB, cough, fever, NVD Disposition: [] ED /[] Urgent Care (no appt availability in office) / [x] Appointment(In office/virtual)/ []  [facility]/ [] Home Care/ [] Refused Recommended Disposition /[]  Mobile Bus/ []  Follow-up with PCP Additional Notes: Patient called reporting worsening body aches, headache, pain on roof of mouth, ear fullness x 3 days. States she began having symptoms of flu six days ago, it resolved and returned. No relief with OTC medications. Per protocol, patient to be evaluated within 24 hours. First available appointment with PCP 07/09/23 @ 0900. Patient scheduled at her request. Care advice reviewed, patient verbalized understandingand denies further questions at this time. Alerting PCP for review.    Reason for Disposition  Diabetes mellitus or weak immune system (e.g., HIV positive, cancer chemo, splenectomy, organ transplant, chronic steroids)  Answer Assessment - Initial Assessment Questions 1. ONSET: "When did the muscle aches or body pains start?"      3 days 2. LOCATION: "What part of your body is hurting?" (e.g., entire body, arms, legs)      Generalized 3. SEVERITY: "How bad is the pain?" (Scale 1-10; or mild, moderate, severe)   - MILD (1-3): doesn't interfere with normal activities    - MODERATE (4-7): interferes with normal activities or awakens from sleep    - SEVERE (8-10):  excruciating pain, unable to do any normal activities      6/10 4. CAUSE: "What do you think is causing the pains?"     Illness for 6 days that resolved and returned. 5. FEVER: "Have you been having fever?"     Denies 6. OTHER SYMPTOMS: "Do you have any other symptoms?" (e.g., chest pain, weakness, rash, cold or flu symptoms, weight loss)     Headache, pain on roof of  mouth, weakness, congestion in ears  8. TRAVEL: "Have you traveled out of the country in the last month?" (e.g., travel history, exposures)     Denies  Protocols used: Muscle Aches and Body Pain-A-AH

## 2023-07-09 ENCOUNTER — Encounter: Payer: Self-pay | Admitting: Emergency Medicine

## 2023-07-09 ENCOUNTER — Ambulatory Visit: Payer: BC Managed Care – PPO | Admitting: Emergency Medicine

## 2023-07-09 VITALS — BP 122/78 | HR 104 | Temp 98.7°F | Ht 63.0 in | Wt 188.0 lb

## 2023-07-09 DIAGNOSIS — I152 Hypertension secondary to endocrine disorders: Secondary | ICD-10-CM

## 2023-07-09 DIAGNOSIS — E1159 Type 2 diabetes mellitus with other circulatory complications: Secondary | ICD-10-CM | POA: Diagnosis not present

## 2023-07-09 DIAGNOSIS — E785 Hyperlipidemia, unspecified: Secondary | ICD-10-CM | POA: Diagnosis not present

## 2023-07-09 DIAGNOSIS — K122 Cellulitis and abscess of mouth: Secondary | ICD-10-CM | POA: Insufficient documentation

## 2023-07-09 DIAGNOSIS — B349 Viral infection, unspecified: Secondary | ICD-10-CM | POA: Insufficient documentation

## 2023-07-09 DIAGNOSIS — E1169 Type 2 diabetes mellitus with other specified complication: Secondary | ICD-10-CM

## 2023-07-09 DIAGNOSIS — Z7984 Long term (current) use of oral hypoglycemic drugs: Secondary | ICD-10-CM

## 2023-07-09 DIAGNOSIS — Z7985 Long-term (current) use of injectable non-insulin antidiabetic drugs: Secondary | ICD-10-CM

## 2023-07-09 LAB — COMPREHENSIVE METABOLIC PANEL
ALT: 16 U/L (ref 0–35)
AST: 22 U/L (ref 0–37)
Albumin: 4 g/dL (ref 3.5–5.2)
Alkaline Phosphatase: 109 U/L (ref 39–117)
BUN: 9 mg/dL (ref 6–23)
CO2: 28 meq/L (ref 19–32)
Calcium: 9.1 mg/dL (ref 8.4–10.5)
Chloride: 102 meq/L (ref 96–112)
Creatinine, Ser: 0.75 mg/dL (ref 0.40–1.20)
GFR: 86.02 mL/min (ref >60.00)
Glucose, Bld: 130 mg/dL — ABNORMAL HIGH (ref 70–99)
Potassium: 3.1 meq/L — ABNORMAL LOW (ref 3.5–5.1)
Sodium: 140 meq/L (ref 135–145)
Total Bilirubin: 0.7 mg/dL (ref 0.2–1.2)
Total Protein: 8.5 g/dL — ABNORMAL HIGH (ref 6.0–8.3)

## 2023-07-09 LAB — MICROALBUMIN / CREATININE URINE RATIO
Creatinine,U: 572.3 mg/dL
Microalb Creat Ratio: 15 mg/g (ref 0.0–30.0)
Microalb, Ur: 8.6 mg/dL — ABNORMAL HIGH (ref 0.0–1.9)

## 2023-07-09 LAB — HEMOGLOBIN A1C: Hgb A1c MFr Bld: 7.1 % — ABNORMAL HIGH (ref 4.6–6.5)

## 2023-07-09 LAB — CBC WITH DIFFERENTIAL/PLATELET
Basophils Absolute: 0 10*3/uL (ref 0.0–0.1)
Basophils Relative: 0.7 % (ref 0.0–3.0)
Eosinophils Absolute: 0 10*3/uL (ref 0.0–0.7)
Eosinophils Relative: 0.8 % (ref 0.0–5.0)
HCT: 38.6 % (ref 36.0–46.0)
Hemoglobin: 12.7 g/dL (ref 12.0–15.0)
Lymphocytes Relative: 38.1 % (ref 12.0–46.0)
Lymphs Abs: 2.2 10*3/uL (ref 0.7–4.0)
MCHC: 32.9 g/dL (ref 30.0–36.0)
MCV: 74.1 fL — ABNORMAL LOW (ref 78.0–100.0)
Monocytes Absolute: 0.4 10*3/uL (ref 0.1–1.0)
Monocytes Relative: 6.9 % (ref 3.0–12.0)
Neutro Abs: 3.1 10*3/uL (ref 1.4–7.7)
Neutrophils Relative %: 53.5 % (ref 43.0–77.0)
Platelets: 243 10*3/uL (ref 150.0–400.0)
RBC: 5.2 Mil/uL — ABNORMAL HIGH (ref 3.87–5.11)
RDW: 15.8 % — ABNORMAL HIGH (ref 11.5–15.5)
WBC: 5.8 10*3/uL (ref 4.0–10.5)

## 2023-07-09 LAB — LIPID PANEL
Cholesterol: 165 mg/dL (ref 0–200)
HDL: 40.9 mg/dL (ref >39.00)
LDL Cholesterol: 111 mg/dL — ABNORMAL HIGH (ref 0–99)
NonHDL: 124.55
Total CHOL/HDL Ratio: 4
Triglycerides: 69 mg/dL (ref 0.0–149.0)
VLDL: 13.8 mg/dL (ref 0.0–40.0)

## 2023-07-09 MED ORDER — AZITHROMYCIN 250 MG PO TABS: ORAL_TABLET | ORAL | 0 refills | Status: DC

## 2023-07-09 NOTE — Patient Instructions (Signed)
 Viral Illness, Adult Viruses are tiny germs that can get into a person's body and cause illness. There are many different types of viruses. And they cause many types of illness. Viral illnesses can range from mild to severe. They can affect various parts of the body. Short-term conditions that are caused by a virus include colds and flu (influenza) and stomach viruses. Long-term conditions that are caused by a virus include herpes, shingles, and human immunodeficiency virus (HIV) infection. A few viruses have been linked to certain cancers. What are the causes? Many types of viruses can cause illness. Viruses get into cells in your body, multiply, and cause the infected cells to work differently or die. When these cells die, they release more of the virus. When this happens, you get symptoms of the illness and the virus spreads to other cells. If the virus takes over how the cell works, it can cause the cell to divide and grow out of control. This happens when a virus causes cancer. Different viruses get into the body in different ways. You can get a virus by: Swallowing food or water that has come in contact with the virus. Breathing in droplets that have been coughed or sneezed into the air by an infected person. Touching a surface that has the virus on it and then touching your eyes, nose, or mouth. Being bitten by an insect or animal that carries the virus. Having sexual contact with a person who is infected with the virus. Being exposed to blood or fluids that contain the virus, either through an open cut or during a transfusion. If a virus enters your body, your body's disease-fighting system (immune system) will try to fight the virus. You may be at higher risk for a viral illness if your immune system is weak. What are the signs or symptoms? Symptoms depend on the type of virus and the location of the cells that it gets into. Symptoms can include: For cold and flu  viruses: Fever. Headache. Sore throat. Muscle aches. Stuffy nose (nasal congestion). Cough. For stomach (gastrointestinal) viruses: Fever. Pain in the abdomen. Nausea or vomiting. Diarrhea. For liver viruses (hepatitis): Loss of appetite. Feeling tired. Skin or the white parts of your eyes turning yellow (jaundice). For brain and spinal cord viruses: Fever. Headache. Stiff neck. Nausea and vomiting. Confusion or being sleepy. For skin viruses: Warts. Itching. Rash. For sexually transmitted viruses: Discharge. Swelling. Redness. Rash. How is this diagnosed? This condition may be diagnosed based on one or more of these: Your symptoms and medical history. A physical exam. Tests, such as: Blood tests. Tests on a sample of mucus from your lungs (sputum sample). Tests on a poop (stool) sample. Tests on a swab of body fluids or a skin sore (lesion). How is this treated? Viruses can be hard to treat because they live within cells. Antibiotics do not treat viruses because these medicines do not get inside cells. Treatment for a viral illness may include: Resting and drinking a lot of fluids. Medicines to treat symptoms. These can include over-the-counter medicine for pain and fever, medicines for cough or congestion, and medicines for diarrhea. Antiviral medicines. These medicines are available only for certain types of viruses. Some viral illnesses can be prevented with vaccinations. A common example is the flu shot. Follow these instructions at home: Medicines Take over-the-counter and prescription medicines only as told by your health care provider. If you were prescribed an antiviral medicine, take it as told by your provider. Do not stop  taking the antiviral even if you start to feel better. Know when antibiotics are needed and when they are not needed. Antibiotics do not treat viruses. You may get an antibiotic if your provider thinks that you may have, or are at risk  for, a bacterial infection and you have a viral infection. Do not ask for an antibiotic prescription if you have been diagnosed with a viral illness. Antibiotics will not make your illness go away faster. Taking antibiotics when they are not needed can lead to antibiotic resistance. When this develops, the medicine no longer works against the bacteria that it normally fights. General instructions Drink enough fluids to keep your pee (urine) pale yellow. Rest as much as possible. Return to your normal activities as told by your provider. Ask your provider what activities are safe for you. How is this prevented? To lower your risk of getting another viral illness: Wash your hands often with soap and water for at least 20 seconds. If soap and water are not available, use hand sanitizer. Avoid touching your nose, eyes, and mouth, especially if you have not washed your hands recently. If anyone in your household has a viral infection, clean all household surfaces that may have been in contact with the virus. Use soap and hot water. You may also use a commercially prepared, bleach-containing solution. Stay away from people who are sick with symptoms of a viral infection. Do not share items such as toothbrushes and water bottles with other people. Keep your vaccinations up to date. This includes getting a yearly flu shot. Eat a healthy diet and get plenty of rest. Contact a health care provider if: You have symptoms of a viral illness that do not go away. Your symptoms come back after going away. Your symptoms get worse. Get help right away if: You have trouble breathing. You have a severe headache or a stiff neck. You have severe vomiting or pain in your abdomen. These symptoms may be an emergency. Get help right away. Call 911. Do not wait to see if the symptoms will go away. Do not drive yourself to the hospital. This information is not intended to replace advice given to you by your health  care provider. Make sure you discuss any questions you have with your health care provider. Document Revised: 05/23/2022 Document Reviewed: 03/07/2022 Elsevier Patient Education  2024 [employer].

## 2023-07-09 NOTE — Assessment & Plan Note (Signed)
Acute painful.  Unable to wear dentures Probably exacerbated by recent viral illness Allergic to penicillin.  Recommend daily azithromycin for 5 days

## 2023-07-09 NOTE — Progress Notes (Signed)
Karen [name] 63 y.o.   Chief Complaint  Patient presents with   Generalized Body Aches    Patient states last week she had diarrhea but that is better. She states the roof of her mouth is sore. She is having dizzy spells, she was taking Nyquil but that ran her HR up so she stop and is now only taking tylenol. She is having chills on and off, no cough, headaches, No congestion, No cough, no sore throat, both ears fullness.    HISTORY OF PRESENT ILLNESS: This is a 63 y.o. female complaining of slow onset flulike symptoms that started about 1 week ago.  Several coworkers were also sick with similar symptoms She developed sore throat followed by generalized aching aches and cold chills.  Was feeling better by Friday so she went back to work. Symptoms returned.  Now mostly pain to upper gum area.  No other associated symptoms No other complaints or medical concerns Patient is diabetic and hypertensive. Lab Results  Component Value Date   HGBA1C 6.5 (A) 02/28/2023   BP Readings from Last 3 Encounters:  07/09/23 122/78  03/26/23 130/78  02/28/23 118/78   Wt Readings from Last 3 Encounters:  07/09/23 188 lb (85.3 kg)  03/26/23 187 lb (84.8 kg)  02/28/23 185 lb 6.4 oz (84.1 kg)     HPI   Prior to Admission medications   Medication Sig Start Date End Date Taking? Authorizing Provider  atorvastatin (LIPITOR) 10 MG tablet Take 1 tablet (10 mg total) by mouth daily. 08/21/22  Yes Brailee Riede, Eilleen Kempf, MD  cetirizine (ZYRTEC) 10 MG tablet Take 1 tablet (10 mg total) by mouth daily. 03/08/22  Yes Allah Reason, Eilleen Kempf, MD  cyclobenzaprine (FLEXERIL) 10 MG tablet Take 1 tablet (10 mg total) by mouth 3 (three) times daily as needed. for muscle spams 03/04/23  Yes Zanaya Baize, Eilleen Kempf, MD  dapagliflozin propanediol (FARXIGA) 10 MG TABS tablet Take 10 mg by mouth daily.   Yes [provider]  esomeprazole (NEXIUM) 40 MG capsule Take 1 capsule (40 mg total) by mouth daily. 07/06/21   Yes Chandra Feger, Eilleen Kempf, MD  methocarbamol (ROBAXIN) 500 MG tablet Take 1 tablet (500 mg total) by mouth 2 (two) times daily. 08/21/22  Yes Tamikka Pilger, Eilleen Kempf, MD  SUMAtriptan (IMITREX) 50 MG tablet Take 1 tablet (50 mg total) by mouth every 2 (two) hours as needed for migraine (Max 2 doses daily). May repeat in 2 hours if headache persists or recurs. 11/06/22  Yes Larren Copes, Eilleen Kempf, MD  tirzepatide [facility]) 7.5 MG/0.5ML Pen Inject 7.5 mg into the skin once a week. 11/06/22  Yes Shemika Robbs, Eilleen Kempf, MD  blood glucose meter kit and supplies KIT Dispense based on patient and insurance preference. Use up to four times daily as directed. Patient not taking: Reported on 07/09/2023 09/05/20   Georgina Quint, MD  blood glucose meter kit and supplies Per insurance coverage. Check blood glucose once a day. Dx E11.65 Patient not taking: Reported on 07/09/2023 02/15/20   Lezlie Lye, Meda Coffee, MD  linaclotide [facility]) 145 MCG CAPS capsule Take 1 capsule (145 mcg total) by mouth daily before breakfast. Patient not taking: Reported on 07/09/2023 03/01/21   Georgina Quint, MD    Allergies  Allergen Reactions   Orange Fruit [Citrus] Hives   Penicillins Hives    Patient Active Problem List   Diagnosis Date Noted   Mass of soft tissue of knee 02/28/2023   Intractable migraine without status migrainosus 11/06/2022  Chronic constipation 03/01/2021   Hyperlipidemia associated with type 2 diabetes mellitus (HCC) 12/30/2018   Seasonal allergies 04/19/2017   Gastroesophageal reflux disease 04/19/2017   Diabetes mellitus (HCC) 02/14/2012   Hypertension associated with diabetes (HCC) 02/14/2012    Past Medical History:  Diagnosis Date   Allergy    Anemia    past hx in her 20's   Diabetes mellitus without complication (HCC)    GERD (gastroesophageal reflux disease)    Hyperlipidemia    Controlled    Hypertension    Controlled     Past Surgical History:  Procedure Laterality Date    ABDOMINAL HYSTERECTOMY     PID; ovaries resected.  Age 38.   APPENDECTOMY      Social History   Socioeconomic History   Marital status: Divorced    Spouse name: Not on file   Number of children: Not on file   Years of education: Not on file   Highest education level: 12th grade  Occupational History   Not on file  Tobacco Use   Smoking status: Former    Current packs/day: 0.70    Average packs/day: 0.7 packs/day for 30.0 years (21.0 ttl pk-yrs)    Types: Cigarettes   Smokeless tobacco: Never  Vaping Use   Vaping status: Never Used  Substance and Sexual Activity   Alcohol use: No   Drug use: No   Sexual activity: Yes    Birth control/protection: None  Other Topics Concern   Not on file  Social History Narrative   Marital status: divorced; not dating      Children:  None      Lives: alone      Employment: International aid/development worker       Tobacco: sometimes; smokes 1 pack per week       Alcohol:  None      Drugs: none      Exercise:  sporadic   Social Drivers of Corporate investment banker Strain: Low Risk  (11/05/2022)   Overall Financial Resource Strain (CARDIA)    Difficulty of Paying Living Expenses: Not hard at all  Food Insecurity: No Food Insecurity (11/05/2022)   Hunger Vital Sign    Worried About Running Out of Food in the Last Year: Never true    Ran Out of Food in the Last Year: Never true  Transportation Needs: No Transportation Needs (11/05/2022)   PRAPARE - Administrator, Civil Service (Medical): No    Lack of Transportation (Non-Medical): No  Physical Activity: Insufficiently Active (11/05/2022)   Exercise Vital Sign    Days of Exercise per Week: 3 days    Minutes of Exercise per Session: 30 min  Stress: No Stress Concern Present (11/05/2022)   [employer] of Occupational Health - Occupational Stress Questionnaire    Feeling of Stress : Not at all  Social Connections: Moderately Isolated (11/05/2022)   Social Connection and Isolation  Panel [NHANES]    Frequency of Communication with Friends and Family: More than three times a week    Frequency of Social Gatherings with Friends and Family: More than three times a week    Attends Religious Services: 1 to 4 times per year    Active Member of [employer] or Organizations: No    Attends Engineer, structural: Not on file    Marital Status: Divorced  Intimate Partner Violence: Not on file    Family History  Problem Relation Age of Onset   Cancer Mother 74  uterine cancer   Uterine cancer Mother    Heart disease Sister 42       AMI/CAD with stenting   Hypertension Sister    Breast cancer Maternal Aunt    Colon cancer Neg Hx    Colon polyps Neg Hx    Esophageal cancer Neg Hx    Stomach cancer Neg Hx    Rectal cancer Neg Hx      Review of Systems  Constitutional:  Positive for chills. Negative for fever.  HENT:  Positive for congestion and sore throat.   Respiratory: Negative.  Negative for cough and shortness of breath.   Cardiovascular: Negative.  Negative for chest pain and palpitations.  Gastrointestinal:  Negative for abdominal pain, diarrhea, nausea and vomiting.  Genitourinary: Negative.  Negative for dysuria and hematuria.  Neurological: Negative.  Negative for dizziness and headaches.    Vitals:   07/09/23 0856  BP: 122/78  Pulse: (!) 104  Temp: 98.7 F (37.1 C)  SpO2: 97%    Physical Exam Vitals reviewed.  Constitutional:      Appearance: Normal appearance.  HENT:     Head: Normocephalic.     Mouth/Throat:     Mouth: Mucous membranes are moist.     Pharynx: Oropharynx is clear.     Comments: Not wearing upper dentures several shallow erythematous ulcers all throughout upper gums Eyes:     Extraocular Movements: Extraocular movements intact.     Pupils: Pupils are equal, round, and reactive to light.  Cardiovascular:     Rate and Rhythm: Normal rate and regular rhythm.     Pulses: Normal pulses.     Heart sounds: Normal heart  sounds.  Pulmonary:     Effort: Pulmonary effort is normal.     Breath sounds: Normal breath sounds.  Musculoskeletal:     Cervical back: No tenderness.  Lymphadenopathy:     Cervical: No cervical adenopathy.  Skin:    General: Skin is warm and dry.  Neurological:     Mental Status: She is alert and oriented to person, place, and time.  Psychiatric:        Mood and Affect: Mood normal.        Behavior: Behavior normal.      ASSESSMENT & PLAN: A total of 44 minutes was spent with the patient and counseling/coordination of care regarding preparing for this visit, review of most recent office visit notes, review of multiple chronic medical conditions and their management, acute mouth infection problem and need for antibiotics, review of all medications, review of most recent bloodwork results, review of health maintenance items, education on nutrition, prognosis, documentation, and need for follow up.   Problem List Items Addressed This Visit       Cardiovascular and Mediastinum   Hypertension associated with diabetes (HCC)   Well-controlled hypertension off medications Well-controlled diabetes with hemoglobin A1c of 6.5 Continue Farxiga 10 mg daily and weekly Mounjaro 7.5 mg Eating better and losing weight Cardiovascular risks associated with diabetes discussed Benefits of exercise discussed Will follow-up in 6 months      Relevant Orders   Urine Microalbumin w/creat. ratio   Comprehensive metabolic panel   Hemoglobin A1c   Lipid panel     Digestive   Infection of mouth - Primary   Acute painful.  Unable to wear dentures Probably exacerbated by recent viral illness Allergic to penicillin.  Recommend daily azithromycin for 5 days      Relevant Medications   azithromycin (ZITHROMAX) 250  MG tablet   Other Relevant Orders   CBC with Differential/Platelet     Endocrine   Hyperlipidemia associated with type 2 diabetes mellitus (HCC)   Chronic stable condition.   Continue atorvastatin 10 mg daily      Relevant Orders   Urine Microalbumin w/creat. ratio   Comprehensive metabolic panel   Hemoglobin A1c   Lipid panel     Other   Viral illness   Clinically stable and running its course without complications Symptom management discussed May take Tylenol and or Advil as needed for headaches and achiness Advised to rest and stay well-hydrated Work note provided      Relevant Medications   azithromycin (ZITHROMAX) 250 MG tablet   Other Relevant Orders   CBC with Differential/Platelet   Patient Instructions  Viral Illness, Adult Viruses are tiny germs that can get into a person's body and cause illness. There are many different types of viruses. And they cause many types of illness. Viral illnesses can range from mild to severe. They can affect various parts of the body. Short-term conditions that are caused by a virus include colds and flu (influenza) and stomach viruses. Long-term conditions that are caused by a virus include herpes, shingles, and human immunodeficiency virus (HIV) infection. A few viruses have been linked to certain cancers. What are the causes? Many types of viruses can cause illness. Viruses get into cells in your body, multiply, and cause the infected cells to work differently or die. When these cells die, they release more of the virus. When this happens, you get symptoms of the illness and the virus spreads to other cells. If the virus takes over how the cell works, it can cause the cell to divide and grow out of control. This happens when a virus causes cancer. Different viruses get into the body in different ways. You can get a virus by: Swallowing food or water that has come in contact with the virus. Breathing in droplets that have been coughed or sneezed into the air by an infected person. Touching a surface that has the virus on it and then touching your eyes, nose, or mouth. Being bitten by an insect or animal that  carries the virus. Having sexual contact with a person who is infected with the virus. Being exposed to blood or fluids that contain the virus, either through an open cut or during a transfusion. If a virus enters your body, your body's disease-fighting system (immune system) will try to fight the virus. You may be at higher risk for a viral illness if your immune system is weak. What are the signs or symptoms? Symptoms depend on the type of virus and the location of the cells that it gets into. Symptoms can include: For cold and flu viruses: Fever. Headache. Sore throat. Muscle aches. Stuffy nose (nasal congestion). Cough. For stomach (gastrointestinal) viruses: Fever. Pain in the abdomen. Nausea or vomiting. Diarrhea. For liver viruses (hepatitis): Loss of appetite. Feeling tired. Skin or the white parts of your eyes turning yellow (jaundice). For brain and spinal cord viruses: Fever. Headache. Stiff neck. Nausea and vomiting. Confusion or being sleepy. For skin viruses: Warts. Itching. Rash. For sexually transmitted viruses: Discharge. Swelling. Redness. Rash. How is this diagnosed? This condition may be diagnosed based on one or more of these: Your symptoms and medical history. A physical exam. Tests, such as: Blood tests. Tests on a sample of mucus from your lungs (sputum sample). Tests on a poop (stool) sample. Tests on  a swab of body fluids or a skin sore (lesion). How is this treated? Viruses can be hard to treat because they live within cells. Antibiotics do not treat viruses because these medicines do not get inside cells. Treatment for a viral illness may include: Resting and drinking a lot of fluids. Medicines to treat symptoms. These can include over-the-counter medicine for pain and fever, medicines for cough or congestion, and medicines for diarrhea. Antiviral medicines. These medicines are available only for certain types of viruses. Some viral  illnesses can be prevented with vaccinations. A common example is the flu shot. Follow these instructions at home: Medicines Take over-the-counter and prescription medicines only as told by your health care provider. If you were prescribed an antiviral medicine, take it as told by your provider. Do not stop taking the antiviral even if you start to feel better. Know when antibiotics are needed and when they are not needed. Antibiotics do not treat viruses. You may get an antibiotic if your provider thinks that you may have, or are at risk for, a bacterial infection and you have a viral infection. Do not ask for an antibiotic prescription if you have been diagnosed with a viral illness. Antibiotics will not make your illness go away faster. Taking antibiotics when they are not needed can lead to antibiotic resistance. When this develops, the medicine no longer works against the bacteria that it normally fights. General instructions Drink enough fluids to keep your pee (urine) pale yellow. Rest as much as possible. Return to your normal activities as told by your provider. Ask your provider what activities are safe for you. How is this prevented? To lower your risk of getting another viral illness: Wash your hands often with soap and water for at least 20 seconds. If soap and water are not available, use hand sanitizer. Avoid touching your nose, eyes, and mouth, especially if you have not washed your hands recently. If anyone in your household has a viral infection, clean all household surfaces that may have been in contact with the virus. Use soap and hot water. You may also use a commercially prepared, bleach-containing solution. Stay away from people who are sick with symptoms of a viral infection. Do not share items such as toothbrushes and water bottles with other people. Keep your vaccinations up to date. This includes getting a yearly flu shot. Eat a healthy diet and get plenty of  rest. Contact a health care provider if: You have symptoms of a viral illness that do not go away. Your symptoms come back after going away. Your symptoms get worse. Get help right away if: You have trouble breathing. You have a severe headache or a stiff neck. You have severe vomiting or pain in your abdomen. These symptoms may be an emergency. Get help right away. Call 911. Do not wait to see if the symptoms will go away. Do not drive yourself to the hospital. This information is not intended to replace advice given to you by your health care provider. Make sure you discuss any questions you have with your health care provider. Document Revised: 05/23/2022 Document Reviewed: 03/07/2022 Elsevier Patient Education  2024 Elsevier Inc.    Edwina Barth, MD [facility]

## 2023-07-09 NOTE — Assessment & Plan Note (Signed)
Clinically stable and running its course without complications Symptom management discussed May take Tylenol and or Advil as needed for headaches and achiness Advised to rest and stay well-hydrated Work note provided

## 2023-07-09 NOTE — Assessment & Plan Note (Signed)
Well-controlled hypertension off medications Well-controlled diabetes with hemoglobin A1c of 6.5 Continue Farxiga 10 mg daily and weekly Mounjaro 7.5 mg Eating better and losing weight Cardiovascular risks associated with diabetes discussed Benefits of exercise discussed Will follow-up in 6 months

## 2023-07-09 NOTE — Assessment & Plan Note (Signed)
Chronic stable condition Continue atorvastatin 10 mg daily

## 2023-07-21 ENCOUNTER — Other Ambulatory Visit: Payer: Self-pay | Admitting: Emergency Medicine

## 2023-07-21 ENCOUNTER — Encounter: Payer: Self-pay | Admitting: Emergency Medicine

## 2023-07-21 DIAGNOSIS — R1013 Epigastric pain: Secondary | ICD-10-CM

## 2023-07-22 ENCOUNTER — Other Ambulatory Visit: Payer: Self-pay | Admitting: Emergency Medicine

## 2023-07-22 NOTE — Telephone Encounter (Signed)
 New prescription was approved on 07/21/2023.

## 2023-07-23 ENCOUNTER — Other Ambulatory Visit: Payer: Self-pay | Admitting: Radiology

## 2023-07-23 ENCOUNTER — Encounter: Payer: Self-pay | Admitting: Emergency Medicine

## 2023-07-23 ENCOUNTER — Other Ambulatory Visit: Payer: Self-pay

## 2023-07-23 DIAGNOSIS — E1169 Type 2 diabetes mellitus with other specified complication: Secondary | ICD-10-CM

## 2023-07-23 MED ORDER — DAPAGLIFLOZIN PROPANEDIOL 10 MG PO TABS: 10.0000 mg | ORAL_TABLET | Freq: Every day | ORAL | 2 refills | Status: DC

## 2023-07-23 MED ORDER — METHOCARBAMOL 500 MG PO TABS: 500.0000 mg | ORAL_TABLET | Freq: Two times a day (BID) | ORAL | 0 refills | Status: AC

## 2023-07-23 MED ORDER — DAPAGLIFLOZIN PROPANEDIOL 10 MG PO TABS
10.0000 mg | ORAL_TABLET | Freq: Every day | ORAL | 2 refills | Status: DC
  Filled 2023-07-23: qty 30, 30d supply, fill #0

## 2023-07-24 ENCOUNTER — Other Ambulatory Visit (HOSPITAL_BASED_OUTPATIENT_CLINIC_OR_DEPARTMENT_OTHER): Payer: Self-pay

## 2023-08-29 ENCOUNTER — Ambulatory Visit: Payer: BC Managed Care – PPO | Admitting: Emergency Medicine

## 2023-09-19 ENCOUNTER — Other Ambulatory Visit: Payer: Self-pay | Admitting: Emergency Medicine

## 2023-09-19 DIAGNOSIS — E1159 Type 2 diabetes mellitus with other circulatory complications: Secondary | ICD-10-CM

## 2023-09-30 ENCOUNTER — Other Ambulatory Visit: Payer: Self-pay | Admitting: Emergency Medicine

## 2023-10-03 MED ORDER — CYCLOBENZAPRINE HCL 10 MG PO TABS: 10.0000 mg | ORAL_TABLET | Freq: Three times a day (TID) | ORAL | 0 refills | Status: DC | PRN

## 2023-10-16 ENCOUNTER — Telehealth: Payer: Self-pay | Admitting: Pharmacist

## 2023-10-16 NOTE — Telephone Encounter (Signed)
 Pharmacy Patient Advocate Encounter   Received notification from Patient Pharmacy that prior authorization for Mounjaro  7.5MG /0.5ML auto-injectors is required/requested.   Insurance verification completed.   The patient is insured through [facility] .   Per test claim: PA required; PA submitted to above mentioned insurance via CoverMyMeds Key/confirmation #/EOC [id number] Status is pending

## 2023-10-17 NOTE — Telephone Encounter (Signed)
 Pharmacy Patient Advocate Encounter  Received notification from [employer] that Prior Authorization for Mounjaro  7.5MG /0.5ML auto-injectors has been APPROVED from 10/16/2023 to 10/15/2024   PA #/Case ID/Reference #: [id number]

## 2024-01-23 ENCOUNTER — Other Ambulatory Visit: Payer: Self-pay | Admitting: Emergency Medicine

## 2024-03-10 ENCOUNTER — Ambulatory Visit (INDEPENDENT_AMBULATORY_CARE_PROVIDER_SITE_OTHER): Admitting: Emergency Medicine

## 2024-03-10 ENCOUNTER — Encounter: Payer: Self-pay | Admitting: Emergency Medicine

## 2024-03-10 VITALS — BP 120/70 | HR 100 | Temp 98.4°F | Wt 186.0 lb

## 2024-03-10 DIAGNOSIS — J22 Unspecified acute lower respiratory infection: Secondary | ICD-10-CM | POA: Insufficient documentation

## 2024-03-10 DIAGNOSIS — R053 Chronic cough: Secondary | ICD-10-CM | POA: Insufficient documentation

## 2024-03-10 MED ORDER — BENZONATATE 200 MG PO CAPS: 200.0000 mg | ORAL_CAPSULE | Freq: Two times a day (BID) | ORAL | 0 refills | Status: DC | PRN

## 2024-03-10 MED ORDER — AZITHROMYCIN 250 MG PO TABS: ORAL_TABLET | ORAL | 0 refills | Status: AC

## 2024-03-10 MED ORDER — HYDROCODONE BIT-HOMATROP MBR 5-1.5 MG/5ML PO SOLN: 5.0000 mL | Freq: Every evening | ORAL | 0 refills | Status: DC | PRN

## 2024-03-10 NOTE — Assessment & Plan Note (Signed)
 Cough management discussed Recommend over-the-counter Mucinex  DM and cough drops Advised to rest and stay well-hydrated Tessalon 200 mg 3 times a day Hycodan syrup at bedtime as needed

## 2024-03-10 NOTE — Progress Notes (Signed)
 Karen [name] 63 y.o.   Chief Complaint  Patient presents with   Cough    Pt was seen at urgent care on Sunday for cough. She was told to take sudafed which has not helped. She is still having cough and congestion along with fatigue. Flu and covid were negative on Sunday.     HISTORY OF PRESENT ILLNESS: Acute problem visit today. This is a 63 y.o. female complaining of flulike symptoms that started last Friday.  Went to urgent care center last Sunday.  Tested negative for both COVID and the flu.  Symptomatic treatment at the time.  Symptoms getting worse.  Today feels worse than when he started.  Mostly complaining of productive cough.  Denies difficulty breathing.  Denies chest pain.  Able to eat and drink.  Denies nausea or vomiting.  Denies abdominal pain or diarrhea.  No other associated symptoms.  No other complaints or medical concerns today.  Cough Pertinent negatives include no chest pain, chills, fever, headaches, sore throat, shortness of breath or wheezing.     Prior to Admission medications   Medication Sig Start Date End Date Taking? Authorizing Provider  atorvastatin  (LIPITOR) 10 MG tablet Take 1 tablet (10 mg total) by mouth daily. 08/21/22  Yes Lynnzie Blackson, Emil Schanz, MD  cetirizine  (ZYRTEC ) 10 MG tablet Take 1 tablet (10 mg total) by mouth daily. 03/08/22  Yes Noorah Giammona, Emil Schanz, MD  cyclobenzaprine  (FLEXERIL ) 10 MG tablet Take 1 tablet by mouth three times daily as needed for muscle spasm 01/23/24  Yes Mikalia Fessel, Emil Schanz, MD  dapagliflozin  propanediol (FARXIGA ) 10 MG TABS tablet Take 1 tablet (10 mg total) by mouth daily. 07/23/23  Yes Purcell Emil Schanz, MD  esomeprazole  (NEXIUM ) 40 MG capsule Take 1 capsule by mouth once daily 07/21/23  Yes Miryam Mcelhinney, Emil Schanz, MD  methocarbamol  (ROBAXIN ) 500 MG tablet Take 1 tablet (500 mg total) by mouth 2 (two) times daily. 07/23/23  Yes Brinlynn Gorton, Emil Schanz, MD  SUMAtriptan  (IMITREX ) 50 MG tablet Take 1 tablet (50 mg total) by  mouth every 2 (two) hours as needed for migraine (Max 2 doses daily). May repeat in 2 hours if headache persists or recurs. 11/06/22  Yes Ruger Saxer, Emil Schanz, MD  tirzepatide  (MOUNJARO ) 7.5 MG/0.5ML Pen INJECT 7.5MG  INTO THE SKIN ONCE A WEEK 09/19/23  Yes Lygia Olaes, Emil Schanz, MD  azithromycin  (ZITHROMAX ) 250 MG tablet Sig as indicated Patient not taking: Reported on 03/10/2024 07/09/23   Purcell Emil Schanz, MD  blood glucose meter kit and supplies KIT Dispense based on patient and insurance preference. Use up to four times daily as directed. Patient not taking: Reported on 03/10/2024 09/05/20   Emyah Roznowski Jose, MD  blood glucose meter kit and supplies Per insurance coverage. Check blood glucose once a day. Dx E11.65 Patient not taking: Reported on 03/10/2024 02/15/20   Melonie Colonel, Mikel CHRISTELLA, MD  linaclotide  (LINZESS ) 145 MCG CAPS capsule Take 1 capsule (145 mcg total) by mouth daily before breakfast. Patient not taking: Reported on 03/10/2024 03/01/21   Maurisio Ruddy Jose, MD    Allergies  Allergen Reactions   Orange Fruit [Citrus] Hives   Penicillins Hives    Patient Active Problem List   Diagnosis Date Noted   Viral illness 07/09/2023   Infection of mouth 07/09/2023   Mass of soft tissue of knee 02/28/2023   Intractable migraine without status migrainosus 11/06/2022   Chronic constipation 03/01/2021   Hyperlipidemia associated with type 2 diabetes mellitus (HCC) 12/30/2018   Seasonal allergies  04/19/2017   Gastroesophageal reflux disease 04/19/2017   Diabetes mellitus (HCC) 02/14/2012   Hypertension associated with diabetes (HCC) 02/14/2012    Past Medical History:  Diagnosis Date   Allergy    Anemia    past hx in her 20's   Diabetes mellitus without complication (HCC)    GERD (gastroesophageal reflux disease)    Hyperlipidemia    Controlled    Hypertension    Controlled     Past Surgical History:  Procedure Laterality Date   ABDOMINAL HYSTERECTOMY     PID;  ovaries resected.  Age 55.   APPENDECTOMY      Social History   Socioeconomic History   Marital status: Divorced    Spouse name: Not on file   Number of children: Not on file   Years of education: Not on file   Highest education level: 12th grade  Occupational History   Not on file  Tobacco Use   Smoking status: Former    Current packs/day: 0.70    Average packs/day: 0.7 packs/day for 30.0 years (21.0 ttl pk-yrs)    Types: Cigarettes   Smokeless tobacco: Never  Vaping Use   Vaping status: Never Used  Substance and Sexual Activity   Alcohol use: No   Drug use: No   Sexual activity: Yes    Birth control/protection: None  Other Topics Concern   Not on file  Social History Narrative   Marital status: divorced; not dating      Children:  None      Lives: alone      Employment: International aid/development worker       Tobacco: sometimes; smokes 1 pack per week       Alcohol:  None      Drugs: none      Exercise:  sporadic   Social Drivers of Corporate investment banker Strain: Low Risk  (11/05/2022)   Overall Financial Resource Strain (CARDIA)    Difficulty of Paying Living Expenses: Not hard at all  Food Insecurity: No Food Insecurity (11/05/2022)   Hunger Vital Sign    Worried About Running Out of Food in the Last Year: Never true    Ran Out of Food in the Last Year: Never true  Transportation Needs: No Transportation Needs (11/05/2022)   PRAPARE - Administrator, Civil Service (Medical): No    Lack of Transportation (Non-Medical): No  Physical Activity: Insufficiently Active (11/05/2022)   Exercise Vital Sign    Days of Exercise per Week: 3 days    Minutes of Exercise per Session: 30 min  Stress: No Stress Concern Present (11/05/2022)   [employer] of Occupational Health - Occupational Stress Questionnaire    Feeling of Stress : Not at all  Social Connections: Moderately Isolated (11/05/2022)   Social Connection and Isolation Panel    Frequency of Communication  with Friends and Family: More than three times a week    Frequency of Social Gatherings with Friends and Family: More than three times a week    Attends Religious Services: 1 to 4 times per year    Active Member of [employer] or Organizations: No    Attends Engineer, structural: Not on file    Marital Status: Divorced  Intimate Partner Violence: Not on file    Family History  Problem Relation Age of Onset   Cancer Mother 86       uterine cancer   Uterine cancer Mother    Heart disease Sister  74       AMI/CAD with stenting   Hypertension Sister    Breast cancer Maternal Aunt    Colon cancer Neg Hx    Colon polyps Neg Hx    Esophageal cancer Neg Hx    Stomach cancer Neg Hx    Rectal cancer Neg Hx      Review of Systems  Constitutional: Negative.  Negative for chills and fever.  HENT:  Positive for congestion. Negative for sore throat.   Respiratory:  Positive for cough. Negative for shortness of breath and wheezing.   Cardiovascular: Negative.  Negative for chest pain and palpitations.  Gastrointestinal:  Negative for abdominal pain, diarrhea, nausea and vomiting.  Genitourinary: Negative.  Negative for dysuria and hematuria.  Skin: Negative.   Neurological: Negative.  Negative for dizziness and headaches.  All other systems reviewed and are negative.   Vitals:   03/10/24 1412  BP: 120/70  Pulse: 100  Temp: 98.4 F (36.9 C)  SpO2: 97%    Physical Exam Vitals reviewed.  Constitutional:      Appearance: Normal appearance.  HENT:     Head: Normocephalic.     Right Ear: Tympanic membrane, ear canal and external ear normal.     Left Ear: Tympanic membrane, ear canal and external ear normal.     Mouth/Throat:     Mouth: Mucous membranes are moist.     Pharynx: Oropharynx is clear.  Eyes:     Extraocular Movements: Extraocular movements intact.     Pupils: Pupils are equal, round, and reactive to light.  Cardiovascular:     Rate and Rhythm: Normal rate and  regular rhythm.     Pulses: Normal pulses.     Heart sounds: Normal heart sounds.  Pulmonary:     Effort: Pulmonary effort is normal.     Breath sounds: Normal breath sounds.  Musculoskeletal:     Cervical back: No tenderness.  Lymphadenopathy:     Cervical: No cervical adenopathy.  Skin:    General: Skin is warm and dry.     Capillary Refill: Capillary refill takes less than 2 seconds.  Neurological:     General: No focal deficit present.     Mental Status: She is alert and oriented to person, place, and time.  Psychiatric:        Mood and Affect: Mood normal.        Behavior: Behavior normal.      ASSESSMENT & PLAN: Problem List Items Addressed This Visit       Respiratory   Lower respiratory infection - Primary   Clinically stable.  No signs of pneumonia. Upper viral respiratory infection now with secondary bacterial infection Recommend daily azithromycin  for 5 days Advised to rest and stay well-hydrated Symptom management discussed. Advised to contact the office if no better or worse during the next several days      Relevant Medications   azithromycin  (ZITHROMAX ) 250 MG tablet     Other   Persistent cough   Cough management discussed Recommend over-the-counter Mucinex  DM and cough drops Advised to rest and stay well-hydrated Tessalon 200 mg 3 times a day Hycodan syrup at bedtime as needed      Relevant Medications   benzonatate (TESSALON) 200 MG capsule   HYDROcodone  bit-homatropine (HYCODAN) 5-1.5 MG/5ML syrup   Patient Instructions  Acute Bronchitis, Adult  Acute bronchitis is when air tubes in the lungs (bronchi) suddenly get swollen. The condition can make it hard for you to breathe.  In adults, acute bronchitis usually goes away within 2 weeks. A cough caused by bronchitis may last up to 3 weeks. Smoking, allergies, and asthma can make the condition worse. What are the causes? Germs that cause cold and flu (viruses). The most common cause of this  condition is the virus that causes the common cold. Bacteria. Substances that bother (irritate) the lungs, including: Smoke from cigarettes and other types of tobacco. Dust and pollen. Fumes from chemicals, gases, or burned fuel. Indoor or outdoor air pollution. What increases the risk? A weak body's defense system. This is also called the immune system. Any condition that affects your lungs and breathing, such as asthma. What are the signs or symptoms? A cough. Coughing up clear, yellow, or green mucus. Making high-pitched whistling sounds when you breathe, most often when you breathe out (wheezing). Runny or stuffy nose. Having too much mucus in your lungs (chest congestion). Shortness of breath. Body aches. A sore throat. How is this treated? Acute bronchitis may go away over time without treatment. Your doctor may tell you to: Drink more fluids. This will help thin your mucus so it is easier to cough up. Use a device that gets medicine into your lungs (inhaler). Use a vaporizer or a humidifier. These are machines that add water to the air. This helps with coughing and poor breathing. Take a medicine that thins mucus and helps clear it from your lungs. Take a medicine that prevents or stops coughing. It is not common to take an antibiotic medicine for this condition. Follow these instructions at home:  Take over-the-counter and prescription medicines only as told by your doctor. Use an inhaler, vaporizer, or humidifier as told by your doctor. Take two teaspoons (10 mL) of honey at bedtime. This helps lessen your coughing at night. Drink enough fluid to keep your pee (urine) pale yellow. Do not smoke or use any products that contain nicotine or tobacco. If you need help quitting, ask your doctor. Get a lot of rest. Return to your normal activities when your doctor says that it is safe. Keep all follow-up visits. How is this prevented?  Wash your hands often with soap and  water for at least 20 seconds. If you cannot use soap and water, use hand sanitizer. Avoid contact with people who have cold symptoms. Try not to touch your mouth, nose, or eyes with your hands. Avoid breathing in smoke or chemical fumes. Make sure to get the flu shot every year. Contact a doctor if: Your symptoms do not get better in 2 weeks. You have trouble coughing up the mucus. Your cough keeps you awake at night. You have a fever. Get help right away if: You cough up blood. You have chest pain. You have very bad shortness of breath. You faint or keep feeling like you are going to faint. You have a very bad headache. Your fever or chills get worse. These symptoms may be an emergency. Get help right away. Call your local emergency services (911 in the [place] Do not wait to see if the symptoms will go away. Do not drive yourself to the hospital. Summary Acute bronchitis is when air tubes in the lungs (bronchi) suddenly get swollen. In adults, acute bronchitis usually goes away within 2 weeks. Drink more fluids. This will help thin your mucus so it is easier to cough up. Take over-the-counter and prescription medicines only as told by your doctor. Contact a doctor if your symptoms do not improve after 2 weeks  of treatment. This information is not intended to replace advice given to you by your health care provider. Make sure you discuss any questions you have with your health care provider. Document Revised: 09/07/2020 Document Reviewed: 09/07/2020 Elsevier Patient Education  2024 Elsevier Inc.     Emil Schaumann, MD [facility]

## 2024-03-10 NOTE — Patient Instructions (Signed)
 Acute Bronchitis, Adult  Acute bronchitis is when air tubes in the lungs (bronchi) suddenly get swollen. The condition can make it hard for you to breathe. In adults, acute bronchitis usually goes away within 2 weeks. A cough caused by bronchitis may last up to 3 weeks. Smoking, allergies, and asthma can make the condition worse. What are the causes? Germs that cause cold and flu (viruses). The most common cause of this condition is the virus that causes the common cold. Bacteria. Substances that bother (irritate) the lungs, including: Smoke from cigarettes and other types of tobacco. Dust and pollen. Fumes from chemicals, gases, or burned fuel. Indoor or outdoor air pollution. What increases the risk? A weak body's defense system. This is also called the immune system. Any condition that affects your lungs and breathing, such as asthma. What are the signs or symptoms? A cough. Coughing up clear, yellow, or green mucus. Making high-pitched whistling sounds when you breathe, most often when you breathe out (wheezing). Runny or stuffy nose. Having too much mucus in your lungs (chest congestion). Shortness of breath. Body aches. A sore throat. How is this treated? Acute bronchitis may go away over time without treatment. Your doctor may tell you to: Drink more fluids. This will help thin your mucus so it is easier to cough up. Use a device that gets medicine into your lungs (inhaler). Use a vaporizer or a humidifier. These are machines that add water to the air. This helps with coughing and poor breathing. Take a medicine that thins mucus and helps clear it from your lungs. Take a medicine that prevents or stops coughing. It is not common to take an antibiotic medicine for this condition. Follow these instructions at home:  Take over-the-counter and prescription medicines only as told by your doctor. Use an inhaler, vaporizer, or humidifier as told by your doctor. Take two teaspoons  (10 mL) of honey at bedtime. This helps lessen your coughing at night. Drink enough fluid to keep your pee (urine) pale yellow. Do not smoke or use any products that contain nicotine or tobacco. If you need help quitting, ask your doctor. Get a lot of rest. Return to your normal activities when your doctor says that it is safe. Keep all follow-up visits. How is this prevented?  Wash your hands often with soap and water for at least 20 seconds. If you cannot use soap and water, use hand sanitizer. Avoid contact with people who have cold symptoms. Try not to touch your mouth, nose, or eyes with your hands. Avoid breathing in smoke or chemical fumes. Make sure to get the flu shot every year. Contact a doctor if: Your symptoms do not get better in 2 weeks. You have trouble coughing up the mucus. Your cough keeps you awake at night. You have a fever. Get help right away if: You cough up blood. You have chest pain. You have very bad shortness of breath. You faint or keep feeling like you are going to faint. You have a very bad headache. Your fever or chills get worse. These symptoms may be an emergency. Get help right away. Call your local emergency services (911 in the [place] Do not wait to see if the symptoms will go away. Do not drive yourself to the hospital. Summary Acute bronchitis is when air tubes in the lungs (bronchi) suddenly get swollen. In adults, acute bronchitis usually goes away within 2 weeks. Drink more fluids. This will help thin your mucus so it is easier  to cough up. Take over-the-counter and prescription medicines only as told by your doctor. Contact a doctor if your symptoms do not improve after 2 weeks of treatment. This information is not intended to replace advice given to you by your health care provider. Make sure you discuss any questions you have with your health care provider. Document Revised: 09/07/2020 Document Reviewed: 09/07/2020 Elsevier Patient  Education  2024 [employer].

## 2024-03-10 NOTE — Assessment & Plan Note (Signed)
 Clinically stable.  No signs of pneumonia. Upper viral respiratory infection now with secondary bacterial infection Recommend daily azithromycin  for 5 days Advised to rest and stay well-hydrated Symptom management discussed. Advised to contact the office if no better or worse during the next several days

## 2024-05-06 ENCOUNTER — Other Ambulatory Visit: Payer: Self-pay | Admitting: Emergency Medicine

## 2024-05-07 ENCOUNTER — Telehealth: Payer: Self-pay

## 2024-05-07 NOTE — Telephone Encounter (Signed)
 Copied from CRM #[id number]. Topic: Clinical - Request for Lab/Test Order >> May 06, 2024  1:37 PM Victoria A wrote: Reason for CRM: Patient would like A1C checked again in January/will need order

## 2024-06-01 ENCOUNTER — Ambulatory Visit: Admitting: Emergency Medicine

## 2024-06-15 ENCOUNTER — Ambulatory Visit: Admitting: Emergency Medicine

## 2024-06-18 ENCOUNTER — Ambulatory Visit: Admitting: Emergency Medicine

## 2024-06-18 ENCOUNTER — Ambulatory Visit: Payer: Self-pay | Admitting: Emergency Medicine

## 2024-06-18 ENCOUNTER — Encounter: Payer: Self-pay | Admitting: Emergency Medicine

## 2024-06-18 VITALS — BP 126/84 | HR 76 | Temp 98.2°F | Ht 63.0 in | Wt 185.0 lb

## 2024-06-18 DIAGNOSIS — E1159 Type 2 diabetes mellitus with other circulatory complications: Secondary | ICD-10-CM

## 2024-06-18 DIAGNOSIS — E1169 Type 2 diabetes mellitus with other specified complication: Secondary | ICD-10-CM

## 2024-06-18 DIAGNOSIS — Z7985 Long-term (current) use of injectable non-insulin antidiabetic drugs: Secondary | ICD-10-CM

## 2024-06-18 DIAGNOSIS — E785 Hyperlipidemia, unspecified: Secondary | ICD-10-CM

## 2024-06-18 DIAGNOSIS — Z7984 Long term (current) use of oral hypoglycemic drugs: Secondary | ICD-10-CM

## 2024-06-18 DIAGNOSIS — I152 Hypertension secondary to endocrine disorders: Secondary | ICD-10-CM | POA: Diagnosis not present

## 2024-06-18 LAB — CBC WITH DIFFERENTIAL/PLATELET
Basophils Absolute: 0 10*3/uL (ref 0.0–0.1)
Basophils Relative: 0.6 % (ref 0.0–3.0)
Eosinophils Absolute: 0.1 10*3/uL (ref 0.0–0.7)
Eosinophils Relative: 1 % (ref 0.0–5.0)
HCT: 37.2 % (ref 36.0–46.0)
Hemoglobin: 12.5 g/dL (ref 12.0–15.0)
Lymphocytes Relative: 26.2 % (ref 12.0–46.0)
Lymphs Abs: 2.1 10*3/uL (ref 0.7–4.0)
MCHC: 33.5 g/dL (ref 30.0–36.0)
MCV: 72.9 fl — ABNORMAL LOW (ref 78.0–100.0)
Monocytes Absolute: 0.6 10*3/uL (ref 0.1–1.0)
Monocytes Relative: 7.9 % (ref 3.0–12.0)
Neutro Abs: 5.1 10*3/uL (ref 1.4–7.7)
Neutrophils Relative %: 64.3 % (ref 43.0–77.0)
Platelets: 270 10*3/uL (ref 150.0–400.0)
RBC: 5.1 Mil/uL (ref 3.87–5.11)
RDW: 16.4 % — ABNORMAL HIGH (ref 11.5–15.5)
WBC: 8 10*3/uL (ref 4.0–10.5)

## 2024-06-18 LAB — COMPREHENSIVE METABOLIC PANEL WITH GFR
ALT: 13 U/L (ref 3–35)
AST: 17 U/L (ref 5–37)
Albumin: 4 g/dL (ref 3.5–5.2)
Alkaline Phosphatase: 137 U/L — ABNORMAL HIGH (ref 39–117)
BUN: 7 mg/dL (ref 6–23)
CO2: 32 meq/L (ref 19–32)
Calcium: 9.6 mg/dL (ref 8.4–10.5)
Chloride: 102 meq/L (ref 96–112)
Creatinine, Ser: 0.75 mg/dL (ref 0.40–1.20)
GFR: 85.45 mL/min
Glucose, Bld: 105 mg/dL — ABNORMAL HIGH (ref 70–99)
Potassium: 3.8 meq/L (ref 3.5–5.1)
Sodium: 140 meq/L (ref 135–145)
Total Bilirubin: 0.5 mg/dL (ref 0.2–1.2)
Total Protein: 8.6 g/dL — ABNORMAL HIGH (ref 6.0–8.3)

## 2024-06-18 LAB — MICROALBUMIN / CREATININE URINE RATIO
Creatinine,U: 280.8 mg/dL
Microalb Creat Ratio: 12.5 mg/g (ref 0.0–30.0)
Microalb, Ur: 3.5 mg/dL — ABNORMAL HIGH (ref 0.7–1.9)

## 2024-06-18 LAB — LIPID PANEL
Cholesterol: 175 mg/dL (ref 28–200)
HDL: 42.3 mg/dL
LDL Cholesterol: 120 mg/dL — ABNORMAL HIGH (ref 10–99)
NonHDL: 132.63
Total CHOL/HDL Ratio: 4
Triglycerides: 64 mg/dL (ref 10.0–149.0)
VLDL: 12.8 mg/dL (ref 0.0–40.0)

## 2024-06-18 LAB — POCT GLYCOSYLATED HEMOGLOBIN (HGB A1C): HbA1c POC (<> result, manual entry): 6.3 %

## 2024-06-18 MED ORDER — MOUNJARO 7.5 MG/0.5ML ~~LOC~~ SOAJ: 7.5000 mg | SUBCUTANEOUS | 3 refills | Status: AC

## 2024-06-18 MED ORDER — DAPAGLIFLOZIN PROPANEDIOL 10 MG PO TABS: 10.0000 mg | ORAL_TABLET | Freq: Every day | ORAL | 3 refills | Status: AC

## 2024-06-18 NOTE — Progress Notes (Signed)
 [name] 64 y.o.   Chief Complaint  Patient presents with   Follow-up    Pt states that she is still having a cough and would like  refill on the cough syrup     HISTORY OF PRESENT ILLNESS: This is a 64 y.o. female here for follow-up of chronic medical conditions including diabetes and hypertension Overall doing well. Still has some lingering cough from prior viral respiratory infections No other complaints or medical concerns today.  Well-controlled hypertension, off medication well-controlled hypertension Wt Readings from Last 3 Encounters:  06/18/24 185 lb (83.9 kg)  03/10/24 186 lb (84.4 kg)  07/09/23 188 lb (85.3 kg)   Lab Results  Component Value Date   HGBA1C 7.1 (H) 07/09/2023   BP Readings from Last 3 Encounters:  06/18/24 126/84  03/10/24 120/70  07/09/23 122/78     HPI   Prior to Admission medications  Medication Sig Start Date End Date Taking? Authorizing Provider  atorvastatin  (LIPITOR) 10 MG tablet Take 1 tablet (10 mg total) by mouth daily. 08/21/22  Yes Rosalie Buenaventura Jose, MD  azithromycin  (ZITHROMAX ) 250 MG tablet Sig as indicated 03/10/24  Yes Aunna Snooks, Emil Schanz, MD  benzonatate  (TESSALON ) 200 MG capsule Take 1 capsule (200 mg total) by mouth 2 (two) times daily as needed for cough. 03/10/24  Yes Cliford Sequeira, Emil Schanz, MD  cetirizine  (ZYRTEC ) 10 MG tablet Take 1 tablet (10 mg total) by mouth daily. 03/08/22  Yes Cariah Salatino, Emil Schanz, MD  cyclobenzaprine  (FLEXERIL ) 10 MG tablet Take 1 tablet by mouth three times daily as needed for muscle spasm 05/06/24  Yes Maie Kesinger, Emil Schanz, MD  dapagliflozin  propanediol (FARXIGA ) 10 MG TABS tablet Take 1 tablet (10 mg total) by mouth daily. 07/23/23  Yes Vikram Tillett, Emil Schanz, MD  esomeprazole  (NEXIUM ) 40 MG capsule Take 1 capsule by mouth once daily 07/21/23  Yes Destin Kittler Jose, MD  HYDROcodone  bit-homatropine (HYCODAN) 5-1.5 MG/5ML syrup Take 5 mLs by mouth at bedtime as needed for cough. 03/10/24   Yes Yuvraj Pfeifer, Emil Schanz, MD  methocarbamol  (ROBAXIN ) 500 MG tablet Take 1 tablet (500 mg total) by mouth 2 (two) times daily. 07/23/23  Yes Dajohn Ellender, Emil Schanz, MD  SUMAtriptan  (IMITREX ) 50 MG tablet Take 1 tablet (50 mg total) by mouth every 2 (two) hours as needed for migraine (Max 2 doses daily). May repeat in 2 hours if headache persists or recurs. 11/06/22  Yes Sirena Riddle, Emil Schanz, MD  tirzepatide  (MOUNJARO ) 7.5 MG/0.5ML Pen INJECT 7.5MG  INTO THE SKIN ONCE A WEEK 09/19/23  Yes Kalin Amrhein, Emil Schanz, MD  blood glucose meter kit and supplies KIT Dispense based on patient and insurance preference. Use up to four times daily as directed. Patient not taking: Reported on 06/18/2024 09/05/20   Gary Bultman Jose, MD  blood glucose meter kit and supplies Per insurance coverage. Check blood glucose once a day. Dx E11.65 Patient not taking: Reported on 06/18/2024 02/15/20   Melonie Colonel, Mikel CHRISTELLA, MD  linaclotide  (LINZESS ) 145 MCG CAPS capsule Take 1 capsule (145 mcg total) by mouth daily before breakfast. Patient not taking: Reported on 06/18/2024 03/01/21   Khyron Garno Jose, MD    Allergies[1]  Patient Active Problem List   Diagnosis Date Noted   Persistent cough 03/10/2024   Lower respiratory infection 03/10/2024   Mass of soft tissue of knee 02/28/2023   Intractable migraine without status migrainosus 11/06/2022   Chronic constipation 03/01/2021   Hyperlipidemia associated with type 2 diabetes mellitus (HCC) 12/30/2018   Seasonal allergies  04/19/2017   Gastroesophageal reflux disease 04/19/2017   Diabetes mellitus (HCC) 02/14/2012   Hypertension associated with diabetes (HCC) 02/14/2012    Past Medical History:  Diagnosis Date   Allergy    Anemia    past hx in her 20's   Diabetes mellitus without complication (HCC)    GERD (gastroesophageal reflux disease)    Hyperlipidemia    Controlled    Hypertension    Controlled     Past Surgical History:  Procedure Laterality Date    ABDOMINAL HYSTERECTOMY     PID; ovaries resected.  Age 82.   APPENDECTOMY      Social History   Socioeconomic History   Marital status: Divorced    Spouse name: Not on file   Number of children: Not on file   Years of education: Not on file   Highest education level: 12th grade  Occupational History   Not on file  Tobacco Use   Smoking status: Former    Current packs/day: 0.70    Average packs/day: 0.7 packs/day for 30.0 years (21.0 ttl pk-yrs)    Types: Cigarettes   Smokeless tobacco: Never  Vaping Use   Vaping status: Never Used  Substance and Sexual Activity   Alcohol use: No   Drug use: No   Sexual activity: Yes    Birth control/protection: None  Other Topics Concern   Not on file  Social History Narrative   Marital status: divorced; not dating      Children:  None      Lives: alone      Employment: international aid/development worker       Tobacco: sometimes; smokes 1 pack per week       Alcohol:  None      Drugs: none      Exercise:  sporadic   Social Drivers of Health   Tobacco Use: Medium Risk (06/18/2024)   Patient History    Smoking Tobacco Use: Former    Smokeless Tobacco Use: Never    Passive Exposure: Not on file  Financial Resource Strain: Medium Risk (06/17/2024)   Overall Financial Resource Strain (CARDIA)    Difficulty of Paying Living Expenses: Somewhat hard  Food Insecurity: Food Insecurity Present (06/17/2024)   Epic    Worried About Programme Researcher, Broadcasting/film/video in the Last Year: Sometimes true    Ran Out of Food in the Last Year: Not on file  Transportation Needs: No Transportation Needs (06/17/2024)   Epic    Lack of Transportation (Medical): No    Lack of Transportation (Non-Medical): No  Physical Activity: Insufficiently Active (06/17/2024)   Exercise Vital Sign    Days of Exercise per Week: 3 days    Minutes of Exercise per Session: 20 min  Stress: No Stress Concern Present (06/17/2024)   [employer] of Occupational Health - Occupational Stress  Questionnaire    Feeling of Stress: Not at all  Social Connections: Unknown (06/17/2024)   Social Connection and Isolation Panel    Frequency of Communication with Friends and Family: Once a week    Frequency of Social Gatherings with Friends and Family: Once a week    Attends Religious Services: 1 to 4 times per year    Active Member of [employer] or Organizations: No    Attends Banker Meetings: Not on file    Marital Status: Not on file  Intimate Partner Violence: Not on file  Depression (PHQ2-9): Low Risk (07/09/2023)   Depression (PHQ2-9)    PHQ-2 Score:  0  Alcohol Screen: Not on file  Housing: Unknown (06/17/2024)   Epic    Unable to Pay for Housing in the Last Year: Patient declined    Number of Times Moved in the Last Year: Not on file    Homeless in the Last Year: No  Utilities: Not on file  Health Literacy: Not on file    Family History  Problem Relation Age of Onset   Cancer Mother 50       uterine cancer   Uterine cancer Mother    Heart disease Sister 66       AMI/CAD with stenting   Hypertension Sister    Breast cancer Maternal Aunt    Colon cancer Neg Hx    Colon polyps Neg Hx    Esophageal cancer Neg Hx    Stomach cancer Neg Hx    Rectal cancer Neg Hx      Review of Systems  Constitutional: Negative.  Negative for chills and fever.  HENT: Negative.  Negative for congestion and sore throat.   Respiratory: Negative.  Negative for cough and shortness of breath.   Cardiovascular: Negative.  Negative for chest pain and palpitations.  Gastrointestinal:  Negative for abdominal pain, diarrhea, nausea and vomiting.  Genitourinary: Negative.  Negative for dysuria and hematuria.  Skin: Negative.  Negative for rash.  Neurological: Negative.  Negative for dizziness and headaches.  All other systems reviewed and are negative.   Vitals:   06/18/24 1020  BP: 126/84  Pulse: 76  Temp: 98.2 F (36.8 C)  SpO2: 97%    Physical Exam Vitals reviewed.   Constitutional:      Appearance: Normal appearance.  HENT:     Head: Normocephalic.     Mouth/Throat:     Mouth: Mucous membranes are moist.     Pharynx: Oropharynx is clear.  Eyes:     Extraocular Movements: Extraocular movements intact.     Conjunctiva/sclera: Conjunctivae normal.     Pupils: Pupils are equal, round, and reactive to light.  Cardiovascular:     Rate and Rhythm: Normal rate and regular rhythm.     Pulses: Normal pulses.     Heart sounds: Normal heart sounds.  Pulmonary:     Effort: Pulmonary effort is normal.     Breath sounds: Normal breath sounds.  Musculoskeletal:     Cervical back: No tenderness.  Lymphadenopathy:     Cervical: No cervical adenopathy.  Skin:    General: Skin is warm and dry.     Capillary Refill: Capillary refill takes less than 2 seconds.  Neurological:     General: No focal deficit present.     Mental Status: She is alert and oriented to person, place, and time.  Psychiatric:        Mood and Affect: Mood normal.        Behavior: Behavior normal.    Results for orders placed or performed in visit on 06/18/24 (from the past 24 hours)  POCT HgB A1C     Status: Abnormal   Collection Time: 06/18/24 10:56 AM  Result Value Ref Range   Hemoglobin A1C     HbA1c POC (<> result, manual entry) 6.3 4.0 - 5.6 %   HbA1c, POC (prediabetic range)     HbA1c, POC (controlled diabetic range)       ASSESSMENT & PLAN: A total of 42 minutes was spent with the patient and counseling/coordination of care regarding preparing for this visit, review of most recent office  visit notes, review of multiple chronic medical conditions and their management, cardiovascular risks associated with hypertension and diabetes, review of all medications, review of most recent bloodwork results including interpretation of today's hemoglobin A1c, review of health maintenance items, education on nutrition, prognosis, documentation, and need for follow up.   Problem List  Items Addressed This Visit       Cardiovascular and Mediastinum   Hypertension associated with diabetes (HCC) - Primary   BP Readings from Last 3 Encounters:  06/18/24 126/84  03/10/24 120/70  07/09/23 122/78  Well-controlled hypertension off medications Well-controlled diabetes with hemoglobin A1c of 6.3 Continue Farxiga  10 mg daily and weekly Mounjaro  7.5 mg Eating better and losing weight Cardiovascular risks associated with diabetes discussed Benefits of exercise discussed Will follow-up in 6 months       Relevant Medications   tirzepatide  (MOUNJARO ) 7.5 MG/0.5ML Pen   dapagliflozin  propanediol (FARXIGA ) 10 MG TABS tablet   Other Relevant Orders   POCT HgB A1C   Microalbumin / creatinine urine ratio   Comprehensive metabolic panel with GFR   CBC with Differential/Platelet   Lipid panel     Endocrine   Hyperlipidemia associated with type 2 diabetes mellitus (HCC)   Chronic stable condition. Continue atorvastatin  10 mg daily  The 10-year ASCVD risk score (Arnett DK, et al., 2019) is: 13%   Values used to calculate the score:     Age: 5 years     Clinically relevant sex: Female     Is Non-Hispanic African American: Yes     Diabetic: Yes     Tobacco smoker: No     Systolic Blood Pressure: 126 mmHg     Is BP treated: No     HDL Cholesterol: 40.9 mg/dL     Total Cholesterol: 165 mg/dL       Relevant Medications   tirzepatide  (MOUNJARO ) 7.5 MG/0.5ML Pen   dapagliflozin  propanediol (FARXIGA ) 10 MG TABS tablet   Other Relevant Orders   Microalbumin / creatinine urine ratio   Comprehensive metabolic panel with GFR   CBC with Differential/Platelet   Lipid panel   Patient Instructions  Health Maintenance, Female Adopting a healthy lifestyle and getting preventive care are important in promoting health and wellness. Ask your health care provider about: The right schedule for you to have regular tests and exams. Things you can do on your own to prevent diseases and  keep yourself healthy. What should I know about diet, weight, and exercise? Eat a healthy diet  Eat a diet that includes plenty of vegetables, fruits, low-fat dairy products, and lean protein. Do not eat a lot of foods that are high in solid fats, added sugars, or sodium. Maintain a healthy weight Body mass index (BMI) is used to identify weight problems. It estimates body fat based on height and weight. Your health care provider can help determine your BMI and help you achieve or maintain a healthy weight. Get regular exercise Get regular exercise. This is one of the most important things you can do for your health. Most adults should: Exercise for at least 150 minutes each week. The exercise should increase your heart rate and make you sweat (moderate-intensity exercise). Do strengthening exercises at least twice a week. This is in addition to the moderate-intensity exercise. Spend less time sitting. Even light physical activity can be beneficial. Watch cholesterol and blood lipids Have your blood tested for lipids and cholesterol at 63 years of age, then have this test every 5 years. Have your  cholesterol levels checked more often if: Your lipid or cholesterol levels are high. You are older than 63 years of age. You are at high risk for heart disease. What should I know about cancer screening? Depending on your health history and family history, you may need to have cancer screening at various ages. This may include screening for: Breast cancer. Cervical cancer. Colorectal cancer. Skin cancer. Lung cancer. What should I know about heart disease, diabetes, and high blood pressure? Blood pressure and heart disease High blood pressure causes heart disease and increases the risk of stroke. This is more likely to develop in people who have high blood pressure readings or are overweight. Have your blood pressure checked: Every 3-5 years if you are 85-46 years of age. Every year if you are  27 years old or older. Diabetes Have regular diabetes screenings. This checks your fasting blood sugar level. Have the screening done: Once every three years after age 92 if you are at a normal weight and have a low risk for diabetes. More often and at a younger age if you are overweight or have a high risk for diabetes. What should I know about preventing infection? Hepatitis B If you have a higher risk for hepatitis B, you should be screened for this virus. Talk with your health care provider to find out if you are at risk for hepatitis B infection. Hepatitis C Testing is recommended for: Everyone born from [birth year] through 1964. Anyone with known risk factors for hepatitis C. Sexually transmitted infections (STIs) Get screened for STIs, including gonorrhea and chlamydia, if: You are sexually active and are younger than 63 years of age. You are older than 63 years of age and your health care provider tells you that you are at risk for this type of infection. Your sexual activity has changed since you were last screened, and you are at increased risk for chlamydia or gonorrhea. Ask your health care provider if you are at risk. Ask your health care provider about whether you are at high risk for HIV. Your health care provider may recommend a prescription medicine to help prevent HIV infection. If you choose to take medicine to prevent HIV, you should first get tested for HIV. You should then be tested every 3 months for as long as you are taking the medicine. Pregnancy If you are about to stop having your period (premenopausal) and you may become pregnant, seek counseling before you get pregnant. Take 400 to 800 micrograms (mcg) of folic acid every day if you become pregnant. Ask for birth control (contraception) if you want to prevent pregnancy. Osteoporosis and menopause Osteoporosis is a disease in which the bones lose minerals and strength with aging. This can result in bone fractures. If you  are 51 years old or older, or if you are at risk for osteoporosis and fractures, ask your health care provider if you should: Be screened for bone loss. Take a calcium  or vitamin D  supplement to lower your risk of fractures. Be given hormone replacement therapy (HRT) to treat symptoms of menopause. Follow these instructions at home: Alcohol use Do not drink alcohol if: Your health care provider tells you not to drink. You are pregnant, may be pregnant, or are planning to become pregnant. If you drink alcohol: Limit how much you have to: 0-1 drink a day. Know how much alcohol is in your drink. In the [place] one drink equals one 12 oz bottle of beer (355 mL), one 5 oz glass  of wine (148 mL), or one 1 oz glass of hard liquor (44 mL). Lifestyle Do not use any products that contain nicotine or tobacco. These products include cigarettes, chewing tobacco, and vaping devices, such as e-cigarettes. If you need help quitting, ask your health care provider. Do not use street drugs. Do not share needles. Ask your health care provider for help if you need support or information about quitting drugs. General instructions Schedule regular health, dental, and eye exams. Stay current with your vaccines. Tell your health care provider if: You often feel depressed. You have ever been abused or do not feel safe at home. Summary Adopting a healthy lifestyle and getting preventive care are important in promoting health and wellness. Follow your health care provider's instructions about healthy diet, exercising, and getting tested or screened for diseases. Follow your health care provider's instructions on monitoring your cholesterol and blood pressure. This information is not intended to replace advice given to you by your health care provider. Make sure you discuss any questions you have with your health care provider. Document Revised: 09/26/2020 Document Reviewed: 09/26/2020 Elsevier Patient Education   2024 Elsevier Inc.     Emil Schaumann, MD [facility]    [1]  Allergies Allergen Reactions   Orange Fruit [Citrus] Hives   Penicillins Hives

## 2024-06-18 NOTE — Assessment & Plan Note (Signed)
 BP Readings from Last 3 Encounters:  06/18/24 126/84  03/10/24 120/70  07/09/23 122/78  Well-controlled hypertension off medications Well-controlled diabetes with hemoglobin A1c of 6.3 Continue Farxiga  10 mg daily and weekly Mounjaro  7.5 mg Eating better and losing weight Cardiovascular risks associated with diabetes discussed Benefits of exercise discussed Will follow-up in 6 months

## 2024-06-18 NOTE — Assessment & Plan Note (Signed)
 Chronic stable condition. Continue atorvastatin  10 mg daily  The 10-year ASCVD risk score (Arnett DK, et al., 2019) is: 13%   Values used to calculate the score:     Age: 64 years     Clinically relevant sex: Female     Is Non-Hispanic African American: Yes     Diabetic: Yes     Tobacco smoker: No     Systolic Blood Pressure: 126 mmHg     Is BP treated: No     HDL Cholesterol: 40.9 mg/dL     Total Cholesterol: 165 mg/dL

## 2024-06-18 NOTE — Patient Instructions (Signed)

## 2024-06-26 ENCOUNTER — Encounter: Payer: Self-pay | Admitting: Family Medicine

## 2024-06-26 ENCOUNTER — Ambulatory Visit: Payer: Self-pay

## 2024-06-26 ENCOUNTER — Ambulatory Visit: Admitting: Family Medicine

## 2024-06-26 VITALS — BP 122/70 | HR 102 | Temp 98.8°F | Ht 63.0 in | Wt 187.0 lb

## 2024-06-26 DIAGNOSIS — R52 Pain, unspecified: Secondary | ICD-10-CM

## 2024-06-26 DIAGNOSIS — J069 Acute upper respiratory infection, unspecified: Secondary | ICD-10-CM

## 2024-06-26 DIAGNOSIS — R053 Chronic cough: Secondary | ICD-10-CM

## 2024-06-26 DIAGNOSIS — R051 Acute cough: Secondary | ICD-10-CM

## 2024-06-26 LAB — POC COVID19 BINAXNOW: SARS Coronavirus 2 Ag: NEGATIVE

## 2024-06-26 MED ORDER — HYDROCODONE BIT-HOMATROP MBR 5-1.5 MG/5ML PO SOLN: 5.0000 mL | Freq: Every evening | ORAL | 0 refills | Status: AC | PRN

## 2024-06-26 MED ORDER — BENZONATATE 200 MG PO CAPS: 200.0000 mg | ORAL_CAPSULE | Freq: Two times a day (BID) | ORAL | 0 refills | Status: AC | PRN

## 2024-06-26 MED ORDER — GUAIFENESIN ER 600 MG PO TB12: 600.0000 mg | ORAL_TABLET | Freq: Two times a day (BID) | ORAL | 0 refills | Status: AC

## 2024-06-26 NOTE — Telephone Encounter (Signed)
 FYI Only or Action Required?: Action required by provider: request for appointment.  Patient was last seen in primary care on 06/18/2024 by Purcell Emil Schanz, MD.  Called Nurse Triage reporting Cough.  Symptoms began yesterday.  Interventions attempted: Nothing.  Symptoms are: gradually worsening.Cough, body aches, chills, diarrhea, runny nose, weak.  Triage Disposition: See Physician Within 24 Hours  Patient/caregiver understands and will follow disposition?: Yes      Reason for Triage: Pt stated that she is experiencing body aches, weakness, diarrhea, chills,fever, and a cough. Pt stated that the symptoms started yesterday and wants to get an appt scheduled with a provider today if possible.    Reason for Disposition  [1] Continuous (nonstop) coughing interferes with work or school AND [2] no improvement using cough treatment per Care Advice  Answer Assessment - Initial Assessment Questions 1. ONSET: When did the cough begin?      yesterday 2. SEVERITY: How bad is the cough today?      severe 3. SPUTUM: Describe the color of your sputum (e.g., none, dry cough; clear, white, yellow, green)     no 4. HEMOPTYSIS: Are you coughing up any blood? If Yes, ask: How much? (e.g., flecks, streaks, tablespoons, etc.)     no 5. DIFFICULTY BREATHING: Are you having difficulty breathing? If Yes, ask: How bad is it? (e.g., mild, moderate, severe)      no 6. FEVER: Do you have a fever? If Yes, ask: What is your temperature, how was it measured, and when did it start?     yes 7. CARDIAC HISTORY: Do you have any history of heart disease? (e.g., heart attack, congestive heart failure)      no 8. LUNG HISTORY: Do you have any history of lung disease?  (e.g., pulmonary embolus, asthma, emphysema)     bronchitis 9. PE RISK FACTORS: Do you have a history of blood clots? (or: recent major surgery, recent prolonged travel, bedridden)     no 10. OTHER SYMPTOMS: Do you  have any other symptoms? (e.g., runny nose, wheezing, chest pain)       Runny nose, eye 11. PREGNANCY: Is there any chance you are pregnant? When was your last menstrual period?       no 12. TRAVEL: Have you traveled out of the country in the last month? (e.g., travel history, exposures)       no  Protocols used: Cough - Acute Non-Productive-A-AH

## 2024-06-26 NOTE — Progress Notes (Signed)
 Subjective:    Discussed the use of AI scribe software for clinical note transcription with the patient, who gave verbal consent to proceed.  History of Present Illness Karen [name] is a 64 year old female who presents with body aches, chills, and headache.  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   Objective: Vitals:   06/26/24 0959  BP: 122/70  Pulse: (!) 102  Temp: 98.8 F (37.1 C)  SpO2: 97%    General appearance: Alert, WD/WN, no distress, mildly ill appearing                             Skin: warm, no rash                           Head: no sinus tenderness                            Eyes: conjunctiva normal, corneas clear, PERRLA                            Ears: pearly TMs, external ear canals normal                          Nose: septum midline, turbinates swollen, with erythema and clear discharge             Mouth/throat: MMM, tongue normal, mild pharyngeal erythema                           Neck: supple, no adenopathy, no thyromegaly, nontender                          Heart: RRR                         Lungs: CTA bilaterally, no wheezes, rales, or rhonchi      Assessment/Plan:  Assessment and Plan Assessment & Plan Acute upper respiratory infection Symptoms began last night with body aches, chills, headache, and  cough. COVID test negative, flu test unavailable. Symptoms suggestive of viral etiology, likely influenza. No chest pain or shortness of breath. Lungs clear on examination. Antibiotics not indicated as symptoms are consistent with viral infection. Discussed potential for bacterial infection if symptoms persist or worsen, but current plan is symptomatic treatment. Explained that antibiotics do not work for viral infections and can lead to allergies or side effects if used unnecessarily. Virus typically resolves in 5-7 days; if symptoms persist or worsen, consider bacterial infection. - Covid test negative. Not tested for flu due to  our office being out of tests.  - Prescribed Tessalon  Perles and Mucinex  for cough. - Refilled cough syrup - Recommended Tylenol  for symptom relief. - Encouraged hydration and rest. - Provided work note for tomorrow. - Instructed to report back if symptoms do not improve by next week.

## 2024-12-16 ENCOUNTER — Ambulatory Visit: Admitting: Emergency Medicine
